# Patient Record
Sex: Male | Born: 1980 | Race: Black or African American | Hispanic: No | Marital: Married | Smoking: Never smoker
Health system: Southern US, Community
[De-identification: ages and names within clinical notes are randomized; demographics above are authoritative.]

## PROBLEM LIST (undated history)

## (undated) DIAGNOSIS — J45909 Unspecified asthma, uncomplicated: Secondary | ICD-10-CM

## (undated) DIAGNOSIS — F32A Depression, unspecified: Secondary | ICD-10-CM

## (undated) DIAGNOSIS — T7840XA Allergy, unspecified, initial encounter: Secondary | ICD-10-CM

## (undated) DIAGNOSIS — F329 Major depressive disorder, single episode, unspecified: Secondary | ICD-10-CM

## (undated) DIAGNOSIS — I10 Essential (primary) hypertension: Secondary | ICD-10-CM

## (undated) HISTORY — PX: OTHER SURGICAL HISTORY: SHX169

## (undated) HISTORY — DX: Depression, unspecified: F32.A

## (undated) HISTORY — DX: Unspecified asthma, uncomplicated: J45.909

## (undated) HISTORY — DX: Allergy, unspecified, initial encounter: T78.40XA

## (undated) HISTORY — DX: Major depressive disorder, single episode, unspecified: F32.9

## (undated) HISTORY — DX: Essential (primary) hypertension: I10

---

## 2015-04-05 ENCOUNTER — Other Ambulatory Visit: Payer: Self-pay | Admitting: Family Medicine

## 2015-04-06 ENCOUNTER — Telehealth: Payer: Self-pay | Admitting: Family Medicine

## 2015-04-06 ENCOUNTER — Other Ambulatory Visit: Payer: Self-pay | Admitting: Family Medicine

## 2015-04-06 DIAGNOSIS — F419 Anxiety disorder, unspecified: Secondary | ICD-10-CM

## 2015-04-06 DIAGNOSIS — J45909 Unspecified asthma, uncomplicated: Secondary | ICD-10-CM | POA: Insufficient documentation

## 2015-04-06 DIAGNOSIS — J4530 Mild persistent asthma, uncomplicated: Secondary | ICD-10-CM

## 2015-04-06 MED ORDER — FLUTICASONE-SALMETEROL 250-50 MCG/DOSE IN AEPB: 1.0000 | INHALATION_SPRAY | Freq: Two times a day (BID) | RESPIRATORY_TRACT | Status: DC

## 2015-04-06 MED ORDER — FLUOXETINE HCL 40 MG PO CAPS: 40.0000 mg | ORAL_CAPSULE | Freq: Every day | ORAL | Status: DC

## 2015-04-06 NOTE — Telephone Encounter (Signed)
Pt called to see if his prescription for prozac had been filled.  His has appt with Dr. Luan Pulling scheduled for July 19th at 10:30.

## 2015-04-06 NOTE — Telephone Encounter (Signed)
I have filled his prozac per his request and given him 1 month of refills. Dr. Luan Pulling may titrate at his next appt.

## 2015-04-17 ENCOUNTER — Other Ambulatory Visit: Payer: Self-pay | Admitting: Family Medicine

## 2015-04-18 ENCOUNTER — Encounter: Payer: Self-pay | Admitting: Family Medicine

## 2015-04-18 ENCOUNTER — Ambulatory Visit (INDEPENDENT_AMBULATORY_CARE_PROVIDER_SITE_OTHER): Payer: BC Managed Care – PPO | Admitting: Family Medicine

## 2015-04-18 VITALS — BP 130/80 | HR 80 | Temp 98.1°F | Resp 16 | Ht 75.0 in | Wt 311.6 lb

## 2015-04-18 DIAGNOSIS — F419 Anxiety disorder, unspecified: Secondary | ICD-10-CM | POA: Diagnosis not present

## 2015-04-18 DIAGNOSIS — R7309 Other abnormal glucose: Secondary | ICD-10-CM

## 2015-04-18 DIAGNOSIS — J4520 Mild intermittent asthma, uncomplicated: Secondary | ICD-10-CM

## 2015-04-18 DIAGNOSIS — R5382 Chronic fatigue, unspecified: Secondary | ICD-10-CM | POA: Diagnosis not present

## 2015-04-18 DIAGNOSIS — R7303 Prediabetes: Secondary | ICD-10-CM

## 2015-04-18 DIAGNOSIS — J4530 Mild persistent asthma, uncomplicated: Secondary | ICD-10-CM

## 2015-04-18 DIAGNOSIS — I10 Essential (primary) hypertension: Secondary | ICD-10-CM | POA: Insufficient documentation

## 2015-04-18 MED ORDER — ALBUTEROL SULFATE HFA 108 (90 BASE) MCG/ACT IN AERS: 2.0000 | INHALATION_SPRAY | Freq: Four times a day (QID) | RESPIRATORY_TRACT | Status: DC | PRN

## 2015-04-18 MED ORDER — FLUOXETINE HCL 40 MG PO CAPS: 40.0000 mg | ORAL_CAPSULE | Freq: Every day | ORAL | Status: DC

## 2015-04-18 MED ORDER — LISINOPRIL 20 MG PO TABS: 20.0000 mg | ORAL_TABLET | Freq: Every day | ORAL | Status: DC

## 2015-04-18 MED ORDER — FLUTICASONE-SALMETEROL 250-50 MCG/DOSE IN AEPB: 1.0000 | INHALATION_SPRAY | Freq: Two times a day (BID) | RESPIRATORY_TRACT | Status: DC

## 2015-04-18 NOTE — Progress Notes (Signed)
Name: Aaron Schwartz   MRN: [id number]    DOB: 19-Jun-1981   Date:04/18/2015       Progress Note  Subjective  Chief Complaint  Chief Complaint  Patient presents with  . Hypertension  . Hyperglycemia  . Anxiety    HPI  Here for f/u of HBP and anxietyh  Has asthma that is well controlled. Has not gotten labs as requested.   Has gained weight.  No c/o except for some decreased energy.  Past Medical History  Diagnosis Date  . Allergy   . Asthma   . Depression   . Hypertension     History reviewed. No pertinent past surgical history.  Family History  Problem Relation Age of Onset  . Hypertension Mother     History   Social History  . Marital Status: Unknown    Spouse Name: N/A  . Number of Children: N/A  . Years of Education: N/A   Occupational History  . Not on file.   Social History Main Topics  . Smoking status: Never Smoker   . Smokeless tobacco: Never Used  . Alcohol Use: No  . Drug Use: No  . Sexual Activity: Not on file   Other Topics Concern  . Not on file   Social History Narrative  . No narrative on file     Current outpatient prescriptions:  .  cetirizine (ZYRTEC) 10 MG tablet, Take 10 mg by mouth daily., Disp: , Rfl:  .  FLUoxetine (PROZAC) 40 MG capsule, Take 1 capsule (40 mg total) by mouth daily., Disp: 30 capsule, Rfl: 0 .  Fluticasone-Salmeterol (ADVAIR DISKUS) 250-50 MCG/DOSE AEPB, Inhale 1 puff into the lungs 2 (two) times daily., Disp: 1 each, Rfl: 0 .  lisinopril (PRINIVIL,ZESTRIL) 20 MG tablet, TAKE 1 TABLET BY MOUTH EVERY DAY, Disp: 90 tablet, Rfl: 0  No Known Allergies   Review of Systems  Constitutional: Positive for malaise/fatigue. Negative for fever, chills and weight loss.  HENT: Negative.   Eyes: Negative.  Negative for blurred vision and double vision.  Respiratory: Negative.  Negative for cough, sputum production, shortness of breath and wheezing.   Cardiovascular: Negative.  Negative for chest pain, palpitations,  orthopnea and leg swelling.  Gastrointestinal: Negative.  Negative for heartburn, nausea, vomiting, abdominal pain, diarrhea and blood in stool.  Genitourinary: Negative.  Negative for dysuria, urgency and frequency.  Musculoskeletal: Negative for myalgias, back pain and joint pain.  Skin: Negative.  Negative for rash.  Neurological: Negative for dizziness, tingling, weakness and headaches.  Psychiatric/Behavioral: Negative for depression. The patient is not nervous/anxious.       Objective  Filed Vitals:   04/18/15 1032  BP: 144/75  Pulse: 80  Temp: 98.1 F (36.7 C)  Resp: 16  Height: 6\' 3"  (1.905 m)  Weight: 311 lb 9.6 oz (141.341 kg)    Physical Exam  Constitutional: He is well-developed, well-nourished, and in no distress. No distress.  HENT:  Head: Normocephalic and atraumatic.  Eyes: Conjunctivae and EOM are normal. Pupils are equal, round, and reactive to light.  Neck: Normal range of motion. Neck supple. No thyromegaly present.  Cardiovascular: Normal rate, regular rhythm, normal heart sounds and intact distal pulses.  Exam reveals no gallop and no friction rub.   No murmur heard. Pulmonary/Chest: Effort normal and breath sounds normal. No respiratory distress. He has no wheezes. He has no rales.  Abdominal: Soft. Bowel sounds are normal. He exhibits no distension and no mass. There is no tenderness.  Musculoskeletal: Normal  range of motion. He exhibits no edema.  Lymphadenopathy:    He has no cervical adenopathy.  Psychiatric: Mood, memory, affect and judgment normal.  Vitals reviewed.         Assessment & Plan  Problem List Items Addressed This Visit      Cardiovascular and Mediastinum   Hypertension - Primary     Respiratory   Asthma    Other Visit Diagnoses    Asthma, mild intermittent, uncomplicated        Acute anxiety           Meds ordered this encounter  Medications  . cetirizine (ZYRTEC) 10 MG tablet    Sig: Take 10 mg by mouth  daily.

## 2015-04-20 LAB — COMPREHENSIVE METABOLIC PANEL
A/G RATIO: 1.6 (ref 1.1–2.5)
ALT: 24 IU/L (ref 0–44)
AST: 20 IU/L (ref 0–40)
Albumin: 5.1 g/dL (ref 3.5–5.5)
Alkaline Phosphatase: 89 IU/L (ref 39–117)
BILIRUBIN TOTAL: 0.5 mg/dL (ref 0.0–1.2)
BUN/Creatinine Ratio: 15 (ref 8–19)
BUN: 19 mg/dL (ref 6–20)
CO2: 22 mmol/L (ref 18–29)
CREATININE: 1.27 mg/dL (ref 0.76–1.27)
Calcium: 10.3 mg/dL — ABNORMAL HIGH (ref 8.7–10.2)
Chloride: 95 mmol/L — ABNORMAL LOW (ref 97–108)
GFR calc non Af Amer: 72 mL/min/{1.73_m2} (ref >59)
GFR, EST AFRICAN AMERICAN: 83 mL/min/{1.73_m2} (ref >59)
GLUCOSE: 105 mg/dL — AB (ref 65–99)
Globulin, Total: 3.2 g/dL (ref 1.5–4.5)
Potassium: 5 mmol/L (ref 3.5–5.2)
Sodium: 136 mmol/L (ref 134–144)
Total Protein: 8.3 g/dL (ref 6.0–8.5)

## 2015-04-20 LAB — LIPID PANEL
CHOL/HDL RATIO: 5.3 ratio — AB (ref 0.0–5.0)
Cholesterol, Total: 263 mg/dL — ABNORMAL HIGH (ref 100–199)
HDL: 50 mg/dL (ref >39)
LDL Calculated: 186 mg/dL — ABNORMAL HIGH (ref 0–99)
TRIGLYCERIDES: 133 mg/dL (ref 0–149)
VLDL Cholesterol Cal: 27 mg/dL (ref 5–40)

## 2015-04-20 LAB — CBC WITH DIFFERENTIAL/PLATELET
BASOS ABS: 0 10*3/uL (ref 0.0–0.2)
BASOS: 0 %
EOS (ABSOLUTE): 0.1 10*3/uL (ref 0.0–0.4)
Eos: 2 %
HEMATOCRIT: 44.4 % (ref 37.5–51.0)
Hemoglobin: 14.9 g/dL (ref 12.6–17.7)
IMMATURE GRANS (ABS): 0 10*3/uL (ref 0.0–0.1)
Immature Granulocytes: 0 %
LYMPHS: 44 %
Lymphocytes Absolute: 1.8 10*3/uL (ref 0.7–3.1)
MCH: 28.9 pg (ref 26.6–33.0)
MCHC: 33.6 g/dL (ref 31.5–35.7)
MCV: 86 fL (ref 79–97)
Monocytes Absolute: 0.4 10*3/uL (ref 0.1–0.9)
Monocytes: 9 %
NEUTROS PCT: 45 %
Neutrophils Absolute: 1.9 10*3/uL (ref 1.4–7.0)
PLATELETS: 383 10*3/uL — AB (ref 150–379)
RBC: 5.15 x10E6/uL (ref 4.14–5.80)
RDW: 14.7 % (ref 12.3–15.4)
WBC: 4.2 10*3/uL (ref 3.4–10.8)

## 2015-04-20 LAB — HEMOGLOBIN A1C
Est. average glucose Bld gHb Est-mCnc: 146 mg/dL
Hgb A1c MFr Bld: 6.7 % — ABNORMAL HIGH (ref 4.8–5.6)

## 2015-04-20 LAB — TSH: TSH: 0.845 u[IU]/mL (ref 0.450–4.500)

## 2015-04-21 ENCOUNTER — Telehealth: Payer: Self-pay | Admitting: Family Medicine

## 2015-04-21 NOTE — Telephone Encounter (Signed)
Pt called for lab results.  Please call [phone]

## 2015-04-21 NOTE — Telephone Encounter (Signed)
Pt.notified

## 2015-05-01 ENCOUNTER — Other Ambulatory Visit: Payer: Self-pay | Admitting: Family Medicine

## 2015-05-05 ENCOUNTER — Other Ambulatory Visit: Payer: Self-pay | Admitting: Family Medicine

## 2015-07-12 NOTE — Telephone Encounter (Signed)
Error

## 2015-07-14 ENCOUNTER — Other Ambulatory Visit: Payer: Self-pay | Admitting: Family Medicine

## 2015-08-23 ENCOUNTER — Ambulatory Visit (INDEPENDENT_AMBULATORY_CARE_PROVIDER_SITE_OTHER): Payer: BC Managed Care – PPO | Admitting: Family Medicine

## 2015-08-23 ENCOUNTER — Encounter: Payer: Self-pay | Admitting: Family Medicine

## 2015-08-23 VITALS — BP 135/80 | HR 69 | Temp 98.0°F | Resp 16 | Ht 75.0 in | Wt 311.0 lb

## 2015-08-23 DIAGNOSIS — L309 Dermatitis, unspecified: Secondary | ICD-10-CM | POA: Insufficient documentation

## 2015-08-23 DIAGNOSIS — I10 Essential (primary) hypertension: Secondary | ICD-10-CM

## 2015-08-23 DIAGNOSIS — J4530 Mild persistent asthma, uncomplicated: Secondary | ICD-10-CM

## 2015-08-23 DIAGNOSIS — F419 Anxiety disorder, unspecified: Secondary | ICD-10-CM

## 2015-08-23 DIAGNOSIS — J302 Other seasonal allergic rhinitis: Secondary | ICD-10-CM

## 2015-08-23 MED ORDER — FLUOCINONIDE 0.05 % EX CREA: 1.0000 "application " | TOPICAL_CREAM | Freq: Two times a day (BID) | CUTANEOUS | Status: DC

## 2015-08-23 MED ORDER — FLUTICASONE-SALMETEROL 250-50 MCG/DOSE IN AEPB: 1.0000 | INHALATION_SPRAY | Freq: Two times a day (BID) | RESPIRATORY_TRACT | Status: DC

## 2015-08-23 MED ORDER — FLUOXETINE HCL 40 MG PO CAPS: 40.0000 mg | ORAL_CAPSULE | Freq: Every day | ORAL | Status: DC

## 2015-08-23 MED ORDER — LISINOPRIL 20 MG PO TABS: 20.0000 mg | ORAL_TABLET | Freq: Every day | ORAL | Status: DC

## 2015-08-23 MED ORDER — TRIAMCINOLONE ACETONIDE 0.5 % EX OINT: 1.0000 "application " | TOPICAL_OINTMENT | Freq: Two times a day (BID) | CUTANEOUS | Status: DC

## 2015-08-23 MED ORDER — ALBUTEROL SULFATE HFA 108 (90 BASE) MCG/ACT IN AERS: 2.0000 | INHALATION_SPRAY | Freq: Four times a day (QID) | RESPIRATORY_TRACT | Status: DC | PRN

## 2015-08-23 MED ORDER — FLUTICASONE PROPIONATE 50 MCG/ACT NA SUSP: 2.0000 | Freq: Every day | NASAL | Status: DC

## 2015-08-23 NOTE — Progress Notes (Signed)
Name: Aaron Schwartz   MRN: [id number]    DOB: Dec 18, 1980   Date:08/23/2015       Progress Note  Subjective  Chief Complaint  Chief Complaint  Patient presents with  . Hypertension    HPI  Here for f/u of HBP.  Having some asthma flairs with cold weather.  Some nasal conmgestion.   C/o some eczema on face and head and behind ears.  No problem-specific assessment & plan notes found for this encounter.   Past Medical History  Diagnosis Date  . Allergy   . Asthma   . Depression   . Hypertension     Past Surgical History  Procedure Laterality Date  . None      Family History  Problem Relation Age of Onset  . Hypertension Mother     Social History   Social History  . Marital Status: Unknown    Spouse Name: N/A  . Number of Children: N/A  . Years of Education: N/A   Occupational History  . Not on file.   Social History Main Topics  . Smoking status: Never Smoker   . Smokeless tobacco: Never Used  . Alcohol Use: No  . Drug Use: No  . Sexual Activity: Not on file   Other Topics Concern  . Not on file   Social History Narrative     Current outpatient prescriptions:  .  albuterol (PROVENTIL HFA;VENTOLIN HFA) 108 (90 BASE) MCG/ACT inhaler, Inhale 2 puffs into the lungs every 6 (six) hours as needed for wheezing or shortness of breath., Disp: 1 Inhaler, Rfl: 12 .  cetirizine (ZYRTEC) 10 MG tablet, TAKE 1 TABLET BY MOUTH EVERY DAY, Disp: 90 tablet, Rfl: 1 .  FLUoxetine (PROZAC) 40 MG capsule, Take 1 capsule (40 mg total) by mouth daily., Disp: 90 capsule, Rfl: 3 .  Fluticasone-Salmeterol (ADVAIR) 250-50 MCG/DOSE AEPB, Inhale 1 puff into the lungs 2 (two) times daily., Disp: 1 each, Rfl: 2 .  lisinopril (PRINIVIL,ZESTRIL) 20 MG tablet, Take 1 tablet (20 mg total) by mouth daily., Disp: 90 tablet, Rfl: 3 .  fluocinonide cream (LIDEX) AB-123456789 %, Apply 1 application topically 2 (two) times daily., Disp: 30 g, Rfl: 6 .  fluticasone (FLONASE) 50 MCG/ACT nasal spray, Place  2 sprays into both nostrils daily., Disp: 16 g, Rfl: 3 .  triamcinolone ointment (KENALOG) 0.5 %, Apply 1 application topically 2 (two) times daily., Disp: 30 g, Rfl: 6  No Known Allergies   Review of Systems  Constitutional: Negative for fever, chills, weight loss and malaise/fatigue.  Respiratory: Positive for shortness of breath and wheezing. Negative for cough and sputum production.   Cardiovascular: Negative for chest pain, palpitations and leg swelling.  Gastrointestinal: Negative for heartburn, abdominal pain and blood in stool.  Genitourinary: Negative for dysuria, urgency and frequency.  Musculoskeletal: Negative for myalgias and joint pain.  Skin: Positive for rash (eczema).  Neurological: Negative for dizziness, tremors and weakness.      Objective  Filed Vitals:   08/23/15 1049 08/23/15 1115  BP: 147/82 135/80  Pulse: 69   Temp: 98 F (36.7 C)   TempSrc: Oral   Resp: 16   Height: 6\' 3"  (1.905 m)   Weight: 311 lb (141.069 kg)     Physical Exam  Constitutional: He is well-developed, well-nourished, and in no distress. No distress.  HENT:  Head: Normocephalic and atraumatic.  Eyes: Conjunctivae and EOM are normal. Pupils are equal, round, and reactive to light. No scleral icterus.  Neck: Normal range  of motion. Neck supple. Carotid bruit is not present. No thyromegaly present.  Cardiovascular: Normal rate, regular rhythm, normal heart sounds and intact distal pulses.  Exam reveals no gallop and no friction rub.   No murmur heard. Pulmonary/Chest: Effort normal and breath sounds normal. No respiratory distress. He has no wheezes. He has no rales.  Abdominal: Soft. Bowel sounds are normal. He exhibits no distension and no abdominal bruit. There is no tenderness. There is no rebound.  Musculoskeletal: Normal range of motion. He exhibits no edema.  Lymphadenopathy:    He has no cervical adenopathy.  Skin:  Eczema behind ears, on scalp. On cheeks and beneath eyes.   Vitals reviewed.      No results found for this or any previous visit (from the past 2160 hour(s)).   Assessment & Plan  Problem List Items Addressed This Visit      Cardiovascular and Mediastinum   Hypertension   Relevant Medications   lisinopril (PRINIVIL,ZESTRIL) 20 MG tablet     Respiratory   Asthma - Primary   Relevant Medications   albuterol (PROVENTIL HFA;VENTOLIN HFA) 108 (90 BASE) MCG/ACT inhaler   Fluticasone-Salmeterol (ADVAIR) 250-50 MCG/DOSE AEPB     Musculoskeletal and Integument   Eczema   Relevant Medications   fluocinonide cream (LIDEX) 0.05 %   triamcinolone ointment (KENALOG) 0.5 %     Other   Anxiety   Relevant Medications   FLUoxetine (PROZAC) 40 MG capsule    Other Visit Diagnoses    Acute anxiety        Relevant Medications    FLUoxetine (PROZAC) 40 MG capsule    Seasonal allergies        Relevant Medications    fluticasone (FLONASE) 50 MCG/ACT nasal spray       Meds ordered this encounter  Medications  . albuterol (PROVENTIL HFA;VENTOLIN HFA) 108 (90 BASE) MCG/ACT inhaler    Sig: Inhale 2 puffs into the lungs every 6 (six) hours as needed for wheezing or shortness of breath.    Dispense:  1 Inhaler    Refill:  12  . fluticasone (FLONASE) 50 MCG/ACT nasal spray    Sig: Place 2 sprays into both nostrils daily.    Dispense:  16 g    Refill:  3  . FLUoxetine (PROZAC) 40 MG capsule    Sig: Take 1 capsule (40 mg total) by mouth daily.    Dispense:  90 capsule    Refill:  3  . lisinopril (PRINIVIL,ZESTRIL) 20 MG tablet    Sig: Take 1 tablet (20 mg total) by mouth daily.    Dispense:  90 tablet    Refill:  3  . Fluticasone-Salmeterol (ADVAIR) 250-50 MCG/DOSE AEPB    Sig: Inhale 1 puff into the lungs 2 (two) times daily.    Dispense:  1 each    Refill:  2  . fluocinonide cream (LIDEX) 0.05 %    Sig: Apply 1 application topically 2 (two) times daily.    Dispense:  30 g    Refill:  6  . triamcinolone ointment (KENALOG) 0.5 %     Sig: Apply 1 application topically 2 (two) times daily.    Dispense:  30 g    Refill:  6   . 1. Asthma, mild persistent, uncomplicated  - albuterol (PROVENTIL HFA;VENTOLIN HFA) 108 (90 BASE) MCG/ACT inhaler; Inhale 2 puffs into the lungs every 6 (six) hours as needed for wheezing or shortness of breath.  Dispense: 1 Inhaler; Refill: 12 - Fluticasone-Salmeterol (  ADVAIR) 250-50 MCG/DOSE AEPB; Inhale 1 puff into the lungs 2 (two) times daily.  Dispense: 1 each; Refill: 2  2. Essential hypertension  - lisinopril (PRINIVIL,ZESTRIL) 20 MG tablet; Take 1 tablet (20 mg total) by mouth daily.  Dispense: 90 tablet; Refill: 3  3. Anxiety   4. Eczema  - fluocinonide cream (LIDEX) 0.05 %; Apply 1 application topically 2 (two) times daily.  Dispense: 30 g; Refill: 6 - triamcinolone ointment (KENALOG) 0.5 %; Apply 1 application topically 2 (two) times daily.  Dispense: 30 g; Refill: 6  5. Acute anxiety  - FLUoxetine (PROZAC) 40 MG capsule; Take 1 capsule (40 mg total) by mouth daily.  Dispense: 90 capsule; Refill: 3  6. Seasonal allergies  - fluticasone (FLONASE) 50 MCG/ACT nasal spray; Place 2 sprays into both nostrils daily.  Dispense: 16 g; Refill: 3

## 2015-08-24 ENCOUNTER — Ambulatory Visit: Payer: BC Managed Care – PPO | Admitting: Family Medicine

## 2015-10-06 ENCOUNTER — Other Ambulatory Visit: Payer: Self-pay | Admitting: Family Medicine

## 2015-10-06 DIAGNOSIS — J4530 Mild persistent asthma, uncomplicated: Secondary | ICD-10-CM

## 2015-10-06 MED ORDER — FLUTICASONE-SALMETEROL 250-50 MCG/DOSE IN AEPB: 1.0000 | INHALATION_SPRAY | Freq: Two times a day (BID) | RESPIRATORY_TRACT | Status: DC

## 2015-12-01 ENCOUNTER — Other Ambulatory Visit: Payer: Self-pay | Admitting: *Deleted

## 2015-12-01 DIAGNOSIS — J302 Other seasonal allergic rhinitis: Secondary | ICD-10-CM

## 2015-12-01 MED ORDER — FLUTICASONE PROPIONATE 50 MCG/ACT NA SUSP: 2.0000 | Freq: Every day | NASAL | Status: DC

## 2016-02-10 ENCOUNTER — Other Ambulatory Visit: Payer: Self-pay | Admitting: Family Medicine

## 2016-03-14 ENCOUNTER — Ambulatory Visit: Payer: BC Managed Care – PPO | Admitting: Family Medicine

## 2016-03-25 ENCOUNTER — Ambulatory Visit (INDEPENDENT_AMBULATORY_CARE_PROVIDER_SITE_OTHER): Payer: BC Managed Care – PPO | Admitting: Family Medicine

## 2016-03-25 ENCOUNTER — Ambulatory Visit: Payer: BC Managed Care – PPO | Admitting: Family Medicine

## 2016-03-25 ENCOUNTER — Encounter: Payer: Self-pay | Admitting: Family Medicine

## 2016-03-25 VITALS — BP 135/75 | HR 73 | Temp 98.7°F | Resp 16 | Ht 75.0 in | Wt 314.0 lb

## 2016-03-25 DIAGNOSIS — F419 Anxiety disorder, unspecified: Secondary | ICD-10-CM

## 2016-03-25 DIAGNOSIS — S93401A Sprain of unspecified ligament of right ankle, initial encounter: Secondary | ICD-10-CM | POA: Diagnosis not present

## 2016-03-25 DIAGNOSIS — I10 Essential (primary) hypertension: Secondary | ICD-10-CM

## 2016-03-25 MED ORDER — LISINOPRIL 20 MG PO TABS: 20.0000 mg | ORAL_TABLET | Freq: Every day | ORAL | Status: DC

## 2016-03-25 MED ORDER — FLUOXETINE HCL 40 MG PO CAPS: 40.0000 mg | ORAL_CAPSULE | Freq: Every day | ORAL | Status: DC

## 2016-03-25 NOTE — Progress Notes (Signed)
Name: Aaron Schwartz   MRN: [id number]    DOB: 07-20-1981   Date:03/25/2016       Progress Note  Subjective  Chief Complaint  Chief Complaint  Patient presents with  . Hypertension  . Anxiety    HPI Here for f/u of HBP and anxiety.  Taking his BP med and his Prozac.  Doing well.  He turned his ankle 3 days ago.  Cont. to be swollen and painful to walk (mod).  No problem-specific assessment & plan notes found for this encounter.   Past Medical History  Diagnosis Date  . Allergy   . Asthma   . Depression   . Hypertension     Past Surgical History  Procedure Laterality Date  . None      Family History  Problem Relation Age of Onset  . Hypertension Mother     Social History   Social History  . Marital Status: Unknown    Spouse Name: N/A  . Number of Children: N/A  . Years of Education: N/A   Occupational History  . Not on file.   Social History Main Topics  . Smoking status: Never Smoker   . Smokeless tobacco: Never Used  . Alcohol Use: No  . Drug Use: No  . Sexual Activity: Not on file   Other Topics Concern  . Not on file   Social History Narrative     Current outpatient prescriptions:  .  albuterol (PROVENTIL HFA;VENTOLIN HFA) 108 (90 BASE) MCG/ACT inhaler, Inhale 2 puffs into the lungs every 6 (six) hours as needed for wheezing or shortness of breath., Disp: 1 Inhaler, Rfl: 12 .  cetirizine (ZYRTEC) 10 MG tablet, TAKE 1 TABLET BY MOUTH EVERY DAY, Disp: 90 tablet, Rfl: 1 .  fluocinonide cream (LIDEX) AB-123456789 %, Apply 1 application topically 2 (two) times daily., Disp: 30 g, Rfl: 6 .  FLUoxetine (PROZAC) 40 MG capsule, Take 1 capsule (40 mg total) by mouth daily., Disp: 90 capsule, Rfl: 3 .  fluticasone (FLONASE) 50 MCG/ACT nasal spray, Place 2 sprays into both nostrils daily., Disp: 48 g, Rfl: 2 .  Fluticasone-Salmeterol (ADVAIR) 250-50 MCG/DOSE AEPB, Inhale 1 puff into the lungs 2 (two) times daily., Disp: 180 each, Rfl: 1 .  lisinopril  (PRINIVIL,ZESTRIL) 20 MG tablet, Take 1 tablet (20 mg total) by mouth daily., Disp: 90 tablet, Rfl: 3 .  triamcinolone ointment (KENALOG) 0.5 %, Apply 1 application topically 2 (two) times daily., Disp: 30 g, Rfl: 6  Not on File   Review of Systems  Constitutional: Negative for fever, chills, weight loss and malaise/fatigue.  HENT: Negative for hearing loss.   Eyes: Negative for blurred vision and double vision.  Respiratory: Negative for cough, shortness of breath and wheezing.   Cardiovascular: Negative for chest pain, palpitations and leg swelling.  Gastrointestinal: Negative for heartburn, abdominal pain and blood in stool.  Genitourinary: Negative for dysuria, urgency and frequency.  Musculoskeletal: Positive for joint pain (R ankle).  Skin: Negative for rash.  Neurological: Negative for dizziness, tremors, weakness and headaches.      Objective  Filed Vitals:   03/25/16 1002 03/25/16 1019  BP: 152/88 135/75  Pulse: 73   Temp: 98.7 F (37.1 C)   TempSrc: Oral   Resp: 16   Height: 6\' 3"  (1.905 m)   Weight: 314 lb (142.429 kg)     Physical Exam  Constitutional: He is oriented to person, place, and time and well-developed, well-nourished, and in no distress. No distress.  HENT:  Head: Normocephalic and atraumatic.  Eyes: Conjunctivae and EOM are normal. Pupils are equal, round, and reactive to light. No scleral icterus.  Neck: Normal range of motion. Neck supple. Carotid bruit is not present. No thyromegaly present.  Cardiovascular: Normal rate, regular rhythm and normal heart sounds.  Exam reveals no gallop and no friction rub.   No murmur heard. Pulmonary/Chest: He is in respiratory distress. He has no wheezes. He has no rales.  Abdominal: Soft. Bowel sounds are normal. He exhibits no distension and no mass. There is no tenderness.  Musculoskeletal: He exhibits no edema.  Tender R lateral foot/ankle in soft tissue areas.  No bony abnormalities.    Lymphadenopathy:     He has no cervical adenopathy.  Neurological: He is alert and oriented to person, place, and time.  Vitals reviewed.      No results found for this or any previous visit (from the past 2160 hour(s)).   Assessment & Plan  Problem List Items Addressed This Visit      Cardiovascular and Mediastinum   Hypertension - Primary     Other   Anxiety    Other Visit Diagnoses    Ankle sprain, right, initial encounter           No orders of the defined types were placed in this encounter.   1. Essential hypertension  - lisinopril (PRINIVIL,ZESTRIL) 20 MG tablet; Take 1 tablet (20 mg total) by mouth daily.  Dispense: 90 tablet; Refill: 3 RTC- 4 months  2. Anxiety  - FLUoxetine (PROZAC) 40 MG capsule; Take 1 capsule (40 mg total) by mouth daily.  Dispense: 90 capsule; Refill: 3  3. Ankle sprain, right, initial encounter Ice, ACE wrap, elevation, rest.  4. Acute anxiety

## 2016-05-14 ENCOUNTER — Other Ambulatory Visit: Payer: Self-pay | Admitting: Family Medicine

## 2016-05-14 DIAGNOSIS — F419 Anxiety disorder, unspecified: Secondary | ICD-10-CM

## 2016-06-07 ENCOUNTER — Other Ambulatory Visit: Payer: Self-pay | Admitting: Family Medicine

## 2016-06-07 DIAGNOSIS — L309 Dermatitis, unspecified: Secondary | ICD-10-CM

## 2016-08-04 ENCOUNTER — Other Ambulatory Visit: Payer: Self-pay | Admitting: Family Medicine

## 2016-08-20 ENCOUNTER — Encounter: Payer: Self-pay | Admitting: Family Medicine

## 2016-08-20 ENCOUNTER — Ambulatory Visit (INDEPENDENT_AMBULATORY_CARE_PROVIDER_SITE_OTHER): Payer: BC Managed Care – PPO | Admitting: Family Medicine

## 2016-08-20 VITALS — BP 142/80 | HR 80 | Temp 98.5°F | Resp 16 | Ht 75.0 in | Wt 310.0 lb

## 2016-08-20 DIAGNOSIS — J4520 Mild intermittent asthma, uncomplicated: Secondary | ICD-10-CM | POA: Diagnosis not present

## 2016-08-20 DIAGNOSIS — F419 Anxiety disorder, unspecified: Secondary | ICD-10-CM

## 2016-08-20 DIAGNOSIS — J209 Acute bronchitis, unspecified: Secondary | ICD-10-CM

## 2016-08-20 DIAGNOSIS — J0141 Acute recurrent pansinusitis: Secondary | ICD-10-CM

## 2016-08-20 DIAGNOSIS — L308 Other specified dermatitis: Secondary | ICD-10-CM

## 2016-08-20 DIAGNOSIS — I10 Essential (primary) hypertension: Secondary | ICD-10-CM

## 2016-08-20 MED ORDER — FLUOCINOLONE ACETONIDE SCALP 0.01 % EX OIL: 1.0000 "application " | TOPICAL_OIL | Freq: Two times a day (BID) | CUTANEOUS | 6 refills | Status: DC

## 2016-08-20 MED ORDER — LEVOFLOXACIN 500 MG PO TABS: 500.0000 mg | ORAL_TABLET | Freq: Every day | ORAL | 0 refills | Status: AC

## 2016-08-20 NOTE — Progress Notes (Signed)
Name: Aaron Schwartz   MRN: [id number]    DOB: 12/18/1980   Date:08/20/2016       Progress Note  Subjective  Chief Complaint  Chief Complaint  Patient presents with  . Follow-up    Bp and anxiety   . Nasal Congestion    dry cough, flare of asthma, and yellow mucus onset for 3 weeks.    HPI Here for f/u of HBP and anxiety.  Taking Lisinopril and Prozac.  Has had some congestion that required inhalers.  He has a cold with cough.  Cough in chest.  Has had bloody mucus from nose.  Some light brown chest sputum occ.  No fever or chills. No problem-specific Assessment & Plan notes found for this encounter.   Past Medical History:  Diagnosis Date  . Allergy   . Asthma   . Depression   . Hypertension     Past Surgical History:  Procedure Laterality Date  . none      Family History  Problem Relation Age of Onset  . Hypertension Mother     Social History   Social History  . Marital status: Unknown    Spouse name: N/A  . Number of children: N/A  . Years of education: N/A   Occupational History  . Not on file.   Social History Main Topics  . Smoking status: Never Smoker  . Smokeless tobacco: Never Used  . Alcohol use Yes     Comment: occasionally   . Drug use: No  . Sexual activity: Not on file   Other Topics Concern  . Not on file   Social History Narrative  . No narrative on file     Current Outpatient Prescriptions:  .  albuterol (PROVENTIL HFA;VENTOLIN HFA) 108 (90 BASE) MCG/ACT inhaler, Inhale 2 puffs into the lungs every 6 (six) hours as needed for wheezing or shortness of breath., Disp: 1 Inhaler, Rfl: 12 .  cetirizine (ZYRTEC) 10 MG tablet, TAKE 1 TABLET BY MOUTH EVERY DAY, Disp: 90 tablet, Rfl: 3 .  fluocinonide cream (LIDEX) AB-123456789 %, Apply 1 application topically 2 (two) times daily., Disp: 30 g, Rfl: 6 .  FLUoxetine (PROZAC) 40 MG capsule, TAKE 1 CAPSULE (40 MG TOTAL) BY MOUTH DAILY., Disp: 90 capsule, Rfl: 3 .  fluticasone (FLONASE) 50 MCG/ACT nasal  spray, Place 2 sprays into both nostrils daily., Disp: 48 g, Rfl: 2 .  Fluticasone-Salmeterol (ADVAIR) 250-50 MCG/DOSE AEPB, Inhale 1 puff into the lungs 2 (two) times daily., Disp: 180 each, Rfl: 1 .  lisinopril (PRINIVIL,ZESTRIL) 20 MG tablet, Take 1 tablet (20 mg total) by mouth daily., Disp: 90 tablet, Rfl: 3 .  triamcinolone ointment (KENALOG) 0.5 %, APPLY 1 APPLICATION TOPICALLY 2 (TWO) TIMES DAILY., Disp: 30 g, Rfl: 2 .  Fluocinolone Acetonide Scalp 0.01 % OIL, Apply 1 application topically 2 (two) times daily., Disp: 1 Bottle, Rfl: 6 .  levofloxacin (LEVAQUIN) 500 MG tablet, Take 1 tablet (500 mg total) by mouth daily., Disp: 10 tablet, Rfl: 0  Not on File   Review of Systems  Constitutional: Positive for malaise/fatigue. Negative for chills, fever and weight loss.  HENT: Positive for congestion. Negative for hearing loss and tinnitus.   Eyes: Negative for blurred vision and double vision.  Respiratory: Positive for cough and sputum production. Negative for shortness of breath and wheezing.   Cardiovascular: Negative for chest pain, palpitations and leg swelling.  Gastrointestinal: Negative for abdominal pain, blood in stool and heartburn.  Genitourinary: Negative for dysuria, frequency  and urgency.  Musculoskeletal: Negative for joint pain and neck pain.  Skin: Negative for rash.  Neurological: Negative for dizziness, tingling, tremors, weakness and headaches.      Objective  Vitals:   08/20/16 1551 08/20/16 1554  BP: (!) 165/82 (!) 142/80  Pulse: 81 80  Resp: 16   Temp: 98.5 F (36.9 C)   TempSrc: Oral   Weight: (!) 140.6 kg (310 lb)   Height: 6\' 3"  (1.905 m)     Physical Exam  Constitutional: He is oriented to person, place, and time and well-developed, well-nourished, and in no distress. No distress.  HENT:  Head: Normocephalic and atraumatic.  Eyes: Conjunctivae and EOM are normal. Pupils are equal, round, and reactive to light. No scleral icterus.  Neck:  Normal range of motion. Neck supple. No thyromegaly present.  Cardiovascular: Normal rate, regular rhythm and normal heart sounds.  Exam reveals no gallop and no friction rub.   No murmur heard. Pulmonary/Chest: Effort normal and breath sounds normal. No respiratory distress. He has no wheezes. He has no rales.  Abdominal: Soft. Bowel sounds are normal. He exhibits no distension and no mass. There is no tenderness.  Musculoskeletal: He exhibits no edema.  Lymphadenopathy:    He has no cervical adenopathy.  Neurological: He is alert and oriented to person, place, and time.  Skin:  Eczema of scalp, behind ears, chest.  Psychiatric:  Affect is good oday  Vitals reviewed.      No results found for this or any previous visit (from the past 2160 hour(s)).   Assessment & Plan  Problem List Items Addressed This Visit      Cardiovascular and Mediastinum   Hypertension - Primary     Respiratory   Asthma     Musculoskeletal and Integument   Eczema   Relevant Medications   Fluocinolone Acetonide Scalp 0.01 % OIL     Other   Anxiety    Other Visit Diagnoses    Acute bronchitis, unspecified organism       Relevant Medications   levofloxacin (LEVAQUIN) 500 MG tablet   Acute recurrent pansinusitis       Relevant Medications   levofloxacin (LEVAQUIN) 500 MG tablet      Meds ordered this encounter  Medications  . levofloxacin (LEVAQUIN) 500 MG tablet    Sig: Take 1 tablet (500 mg total) by mouth daily.    Dispense:  10 tablet    Refill:  0  . Fluocinolone Acetonide Scalp 0.01 % OIL    Sig: Apply 1 application topically 2 (two) times daily.    Dispense:  1 Bottle    Refill:  6  1. Essential hypertension Cont Lisinopril  2. Mild intermittent asthma without complication Cont inhalers as needed  3. Other eczema  - Fluocinolone Acetonide Scalp 0.01 % OIL; Apply 1 application topically 2 (two) times daily.  Dispense: 1 Bottle; Refill: 6  4. Anxiety Cont Prozac  5.  Acute bronchitis, unspecified organism  - levofloxacin (LEVAQUIN) 500 MG tablet; Take 1 tablet (500 mg total) by mouth daily.  Dispense: 10 tablet; Refill: 0  6. Acute recurrent pansinusitis  - levofloxacin (LEVAQUIN) 500 MG tablet; Take 1 tablet (500 mg total) by mouth daily.  Dispense: 10 tablet; Refill: 0

## 2016-09-27 ENCOUNTER — Ambulatory Visit (INDEPENDENT_AMBULATORY_CARE_PROVIDER_SITE_OTHER): Payer: BC Managed Care – PPO | Admitting: Family Medicine

## 2016-09-27 ENCOUNTER — Encounter: Payer: Self-pay | Admitting: Family Medicine

## 2016-09-27 VITALS — BP 137/78 | HR 80 | Temp 98.8°F | Resp 16 | Ht 75.0 in | Wt 319.6 lb

## 2016-09-27 DIAGNOSIS — E559 Vitamin D deficiency, unspecified: Secondary | ICD-10-CM

## 2016-09-27 DIAGNOSIS — R7309 Other abnormal glucose: Secondary | ICD-10-CM | POA: Diagnosis not present

## 2016-09-27 DIAGNOSIS — J3489 Other specified disorders of nose and nasal sinuses: Secondary | ICD-10-CM | POA: Diagnosis not present

## 2016-09-27 DIAGNOSIS — H1031 Unspecified acute conjunctivitis, right eye: Secondary | ICD-10-CM | POA: Diagnosis not present

## 2016-09-27 DIAGNOSIS — I10 Essential (primary) hypertension: Secondary | ICD-10-CM | POA: Diagnosis not present

## 2016-09-27 DIAGNOSIS — E119 Type 2 diabetes mellitus without complications: Secondary | ICD-10-CM

## 2016-09-27 DIAGNOSIS — E782 Mixed hyperlipidemia: Secondary | ICD-10-CM

## 2016-09-27 DIAGNOSIS — R7989 Other specified abnormal findings of blood chemistry: Secondary | ICD-10-CM | POA: Diagnosis not present

## 2016-09-27 DIAGNOSIS — D473 Essential (hemorrhagic) thrombocythemia: Secondary | ICD-10-CM | POA: Diagnosis not present

## 2016-09-27 DIAGNOSIS — J3089 Other allergic rhinitis: Secondary | ICD-10-CM | POA: Insufficient documentation

## 2016-09-27 DIAGNOSIS — E1169 Type 2 diabetes mellitus with other specified complication: Secondary | ICD-10-CM | POA: Insufficient documentation

## 2016-09-27 DIAGNOSIS — E785 Hyperlipidemia, unspecified: Secondary | ICD-10-CM | POA: Insufficient documentation

## 2016-09-27 DIAGNOSIS — D75839 Thrombocytosis, unspecified: Secondary | ICD-10-CM

## 2016-09-27 MED ORDER — OLOPATADINE HCL 0.1 % OP SOLN: 1.0000 [drp] | Freq: Every day | OPHTHALMIC | 2 refills | Status: DC | PRN

## 2016-09-27 MED ORDER — OFLOXACIN 0.3 % OP SOLN: 2.0000 [drp] | Freq: Four times a day (QID) | OPHTHALMIC | 0 refills | Status: DC

## 2016-09-27 NOTE — Progress Notes (Signed)
Subjective:    Patient ID: Aaron Schwartz, male    DOB: 06/15/1981, 35 y.o.   MRN: [id number]  Aaron Schwartz is a 35 y.o. male presenting on 09/27/2016 for Conjunctivitis (onset 6 days crusty eyes when he wakes up runny discharge Right side)   HPI  Right Eye Conjunctivitis: - Reports R eye started with redness irritation thicker drainage and discharge over past 6 days, occasional intermittent improvement but then worsening again and persistent symptoms  - Had URI symptoms about 1 month ago now mostly resolved still slightly lingering - Has significant allergies takes Cetirizine daily, Flonase regularly. Occasionally uses OTC anti-histamine eye drops, feels different than allergies - Admits sick contact previously 1-2 months ago with daughter 30 months old had conjunctivitis with daycare exposure - Denies fevers/chills, nasal congestion, cough, eye injury, eye pain, loss of vision, eyelid swelling or facial rash  Right Nasal Mass: - Reports he has never had a history of nasal mass or polyp. Has chronic allergic rhinitis, uses flonase and cetirizine regularly, has some nasal congestion but this is usually intermittent, has not had problem with unable to breath out of right nostril. Has not seen ENT for this before. - Denies significant bleeding from nose or epistaxis, nose pain or sinus pain  Chronic HTN / History of Abnormal Glucose / Hyperlipidemia: - Last labs checked 03/2015, patient with reported HTN, controlled on meds with Lisinopril daily. Has had elevated abnormal cholesterol, not taking statin or cholesterol med. Prior abnormal glucose by chart review A1c 6.7 in past 2016, has not been dx with Pre-Dm or DM without significant DM family history  Family History  Problem Relation Age of Onset  . Hypertension Mother      Social History  Substance Use Topics  . Smoking status: Never Smoker  . Smokeless tobacco: Never Used  . Alcohol use Yes     Comment: occasionally      Review of Systems Per HPI unless specifically indicated above     Objective:    BP 137/78   Pulse 80   Temp 98.8 F (37.1 C) (Oral)   Resp 16   Ht 6\' 3"  (1.905 m)   Wt (!) 319 lb 9.6 oz (145 kg)   BMI 39.95 kg/m   Wt Readings from Last 3 Encounters:  09/27/16 (!) 319 lb 9.6 oz (145 kg)  08/20/16 (!) 310 lb (140.6 kg)  03/25/16 (!) 314 lb (142.4 kg)    Physical Exam  Constitutional: He appears well-developed and well-nourished. No distress.  Well-appearing, comfortable, cooperative, athletic build  HENT:  Head: Normocephalic and atraumatic.  Frontal / maxillary sinuses non-tender. Oropharynx clear without erythema, exudates, edema or asymmetry.  Nares patent without purulence or edema. Right nasal cavity deeper in viewable with otoscope with Right lateral wall moderately sized nasal mass with dried blood and crusty abnormal mucosal tissue appearance, some congestion, not obstructing, see picture below  Eyes: EOM are normal. Pupils are equal, round, and reactive to light. Right eye exhibits discharge (watery discharge). Left eye exhibits no discharge.  Right eye Mild generalized scleral / conjunctival injection without focal point. Residual crusted drainage medial > lateral. No eyelid erythema or edema.  Neck: Normal range of motion. Neck supple.  Cardiovascular: Normal rate and intact distal pulses.   Pulmonary/Chest: Effort normal.  Lymphadenopathy:    He has no cervical adenopathy.  Neurological: He is alert.  Skin: Skin is warm and dry. He is not diaphoretic.  Nursing note and vitals reviewed.  Right nare, zoomed in picture through using otoscope      Results for orders placed or performed in visit on 04/18/15  Comprehensive Metabolic Panel (CMET)  Result Value Ref Range   Glucose 105 (H) 65 - 99 mg/dL   BUN 19 6 - 20 mg/dL   Creatinine, Ser 1.27 0.76 - 1.27 mg/dL   GFR calc non Af Amer 72 >59 mL/min/1.73   GFR calc Af Amer 83 >59 mL/min/1.73    BUN/Creatinine Ratio 15 8 - 19   Sodium 136 134 - 144 mmol/L   Potassium 5.0 3.5 - 5.2 mmol/L   Chloride 95 (L) 97 - 108 mmol/L   CO2 22 18 - 29 mmol/L   Calcium 10.3 (H) 8.7 - 10.2 mg/dL   Total Protein 8.3 6.0 - 8.5 g/dL   Albumin 5.1 3.5 - 5.5 g/dL   Globulin, Total 3.2 1.5 - 4.5 g/dL   Albumin/Globulin Ratio 1.6 1.1 - 2.5   Bilirubin Total 0.5 0.0 - 1.2 mg/dL   Alkaline Phosphatase 89 39 - 117 IU/L   AST 20 0 - 40 IU/L   ALT 24 0 - 44 IU/L  CBC with Differential  Result Value Ref Range   WBC 4.2 3.4 - 10.8 x10E3/uL   RBC 5.15 4.14 - 5.80 x10E6/uL   Hemoglobin 14.9 12.6 - 17.7 g/dL   Hematocrit 44.4 37.5 - 51.0 %   MCV 86 79 - 97 fL   MCH 28.9 26.6 - 33.0 pg   MCHC 33.6 31.5 - 35.7 g/dL   RDW 14.7 12.3 - 15.4 %   Platelets 383 (H) 150 - 379 x10E3/uL   Neutrophils 45 %   Lymphs 44 %   Monocytes 9 %   Eos 2 %   Basos 0 %   Neutrophils Absolute 1.9 1.4 - 7.0 x10E3/uL   Lymphocytes Absolute 1.8 0.7 - 3.1 x10E3/uL   Monocytes Absolute 0.4 0.1 - 0.9 x10E3/uL   EOS (ABSOLUTE) 0.1 0.0 - 0.4 x10E3/uL   Basophils Absolute 0.0 0.0 - 0.2 x10E3/uL   Immature Granulocytes 0 %   Immature Grans (Abs) 0.0 0.0 - 0.1 x10E3/uL  Lipid Profile  Result Value Ref Range   Cholesterol, Total 263 (H) 100 - 199 mg/dL   Triglycerides 133 0 - 149 mg/dL   HDL 50 >39 mg/dL   VLDL Cholesterol Cal 27 5 - 40 mg/dL   LDL Calculated 186 (H) 0 - 99 mg/dL   Chol/HDL Ratio 5.3 (H) 0.0 - 5.0 ratio units  TSH  Result Value Ref Range   TSH 0.845 0.450 - 4.500 uIU/mL  HgB A1c  Result Value Ref Range   Hgb A1c MFr Bld 6.7 (H) 4.8 - 5.6 %   Est. average glucose Bld gHb Est-mCnc 146 mg/dL      Assessment & Plan:   Problem List Items Addressed This Visit    Mass of nasal sinus   Relevant Orders   Ambulatory referral to ENT   Hypertension    Chronic problem, stable, on medication Check future chemistry before next visit within 5 months      Relevant Orders   COMPLETE METABOLIC PANEL WITH GFR    Hyperlipidemia    Abnormal lipids with high TG, LDL, last checked 03/2015 Not on statin therapy Check future fasting lipids before next visit 01/2017      Relevant Orders   Lipid panel   Environmental and seasonal allergies   Relevant Medications   olopatadine (PATANOL) 0.1 % ophthalmic solution   Elevated  serum creatinine    Last Cr 1.27 (03/2015), no other for comparison baseline, does have large muscle mass, likely contributing. Chronic history of HTN, prior elevated sugar without dx DM Check future fasting labs including chemistry follow-up      Relevant Orders   COMPLETE METABOLIC PANEL WITH GFR   Abnormal glucose    Chart review with A1c 6.7 (03/2015), and abnormal glucose 105. Patient denies known history of DM or Pre-DM, not on therapy Check future fasting labs and A1c before next visit 01/2017      Relevant Orders   Hemoglobin A1c    Other Visit Diagnoses    Acute conjunctivitis of right eye, unspecified acute conjunctivitis type    -  Primary   Relevant Medications   ofloxacin (OCUFLOX) 0.3 % ophthalmic solution   Vitamin D deficiency       Relevant Orders   VITAMIN D 25 Hydroxy (Vit-D Deficiency, Fractures)   Thrombocytosis (HCC)       Relevant Orders   CBC with Differential/Platelet      Meds ordered this encounter  Medications  . ofloxacin (OCUFLOX) 0.3 % ophthalmic solution    Sig: Place 2 drops into the right eye 4 (four) times daily. For 5-7 days    Dispense:  5 mL    Refill:  0  . olopatadine (PATANOL) 0.1 % ophthalmic solution    Sig: Place 1 drop into both eyes daily as needed for allergies.    Dispense:  5 mL    Refill:  2      Follow up plan: Return in about 2 weeks (around 10/11/2016), or if symptoms worsen or fail to improve, for conjunctivitis.  Nobie Putnam, DO [facility] 09/27/2016, 12:31 PM

## 2016-09-27 NOTE — Assessment & Plan Note (Signed)
Abnormal lipids with high TG, LDL, last checked 03/2015 Not on statin therapy Check future fasting lipids before next visit 01/2017

## 2016-09-27 NOTE — Assessment & Plan Note (Signed)
Chronic problem, stable, on medication Check future chemistry before next visit within 5 months

## 2016-09-27 NOTE — Patient Instructions (Signed)
Thank you for coming in to clinic today.  1. Most likely you have Pink eye, possibly virus vs bacterial - Coverage with stronger antibiotic eye drop, 2 drops 4 times a day for 5-7 days, if cost is too much let me know we can send in Erythromycin antibiotic ointment, which does work well also - Also sent rx for anti-allergy eye drop, use this in each eye once daily as needed for itchy watery eyes, especially if outdoor frequently  For Right deeper nasal mass/polyp. I think this needs to be biopsied or removed, to make sure it is not anything serious.  [address]  [place], [place] [address] Ph: [phone]  [facility] [place] [address]  [place], [place] [address] Ph: [phone]  They will call you with apt, if you don't hear back within next 2 weeks, give them a call to check status  You will be due for FASTING BLOOD WORK (no food or drink after midnight before, only water or coffee without cream/sugar on the morning of)  - Please go ahead and schedule a "Lab Only" visit in the morning at the clinic for lab draw in  - Make sure Lab Only appointment is at least 1-2 weeks before your next appointment, so that results will be available  For Lab Results, once available within 2-3 days of blood draw, you can can log in to MyChart online to view your results and a brief explanation. Also, we can discuss results at next follow-up visit.  Please schedule a follow-up appointment with Dr. Parks Ranger in 2 weeks as needed for conjunctivitis  If you have any other questions or concerns, please feel free to call the clinic or send a message through [place]. You may also schedule an earlier appointment if necessary.  Nobie Putnam, DO [place]

## 2016-09-27 NOTE — Assessment & Plan Note (Signed)
Chart review with A1c 6.7 (03/2015), and abnormal glucose 105. Patient denies known history of DM or Pre-DM, not on therapy Check future fasting labs and A1c before next visit 01/2017

## 2016-09-27 NOTE — Assessment & Plan Note (Signed)
Last Cr 1.27 (03/2015), no other for comparison baseline, does have large muscle mass, likely contributing. Chronic history of HTN, prior elevated sugar without dx DM Check future fasting labs including chemistry follow-up

## 2016-10-01 ENCOUNTER — Telehealth: Payer: Self-pay | Admitting: Family Medicine

## 2016-10-01 DIAGNOSIS — H1031 Unspecified acute conjunctivitis, right eye: Secondary | ICD-10-CM

## 2016-10-01 MED ORDER — ERYTHROMYCIN 5 MG/GM OP OINT: 1.0000 "application " | TOPICAL_OINTMENT | Freq: Four times a day (QID) | OPHTHALMIC | 0 refills | Status: DC

## 2016-10-01 NOTE — Telephone Encounter (Signed)
Last visit on 12/29, unclear exactly etiology but suspected more viral conjunctivitis, already given empiric coverge with Oxfloxacin eye drops, has used for 4 days, as advised it may need to run it's course, this is one of strongest eye drop antibiotics, we can try alternative with eye ointment with erythromycin instead, sent new rx to pharmacy, use 4 times a day for 1 week.  If symptoms still not improved over next 2-3 days, he can notify office again, and we will work on ophthalmology referral for further management, as I have limited other options at that point.  If significant worsening with eye pain, redness or swelling of skin on eyelids or surrounding, change or loss of vision, he needs to go directly to Emergency Department instead and have it evaluated.  Nobie Putnam, DO [facility] 10/01/2016, 1:40 PM

## 2016-10-01 NOTE — Telephone Encounter (Signed)
Pt's pink eye is not any better.  Is there something stronger that call be called in to [facility] in [place].  His call back number is [phone]

## 2016-10-31 ENCOUNTER — Other Ambulatory Visit: Payer: Self-pay | Admitting: Family Medicine

## 2016-10-31 DIAGNOSIS — I10 Essential (primary) hypertension: Secondary | ICD-10-CM

## 2016-10-31 DIAGNOSIS — F419 Anxiety disorder, unspecified: Secondary | ICD-10-CM

## 2017-01-27 ENCOUNTER — Other Ambulatory Visit: Payer: Self-pay

## 2017-01-27 DIAGNOSIS — J45909 Unspecified asthma, uncomplicated: Secondary | ICD-10-CM

## 2017-01-27 MED ORDER — ALBUTEROL SULFATE HFA 108 (90 BASE) MCG/ACT IN AERS: 2.0000 | INHALATION_SPRAY | Freq: Four times a day (QID) | RESPIRATORY_TRACT | 12 refills | Status: DC | PRN

## 2017-01-27 NOTE — Telephone Encounter (Signed)
Pharmacy requesting refill Last ov 09/27/16  Last filled 08/23/15 Please review. Thank you. sd

## 2017-02-21 ENCOUNTER — Encounter: Payer: Self-pay | Admitting: Family Medicine

## 2017-02-21 ENCOUNTER — Ambulatory Visit (INDEPENDENT_AMBULATORY_CARE_PROVIDER_SITE_OTHER): Payer: BC Managed Care – PPO | Admitting: Family Medicine

## 2017-02-21 VITALS — BP 128/69 | HR 82 | Temp 98.3°F | Resp 16 | Ht 75.0 in | Wt 321.0 lb

## 2017-02-21 DIAGNOSIS — J3089 Other allergic rhinitis: Secondary | ICD-10-CM | POA: Diagnosis not present

## 2017-02-21 DIAGNOSIS — E782 Mixed hyperlipidemia: Secondary | ICD-10-CM

## 2017-02-21 DIAGNOSIS — I10 Essential (primary) hypertension: Secondary | ICD-10-CM | POA: Diagnosis not present

## 2017-02-21 DIAGNOSIS — R7309 Other abnormal glucose: Secondary | ICD-10-CM | POA: Diagnosis not present

## 2017-02-21 DIAGNOSIS — J3489 Other specified disorders of nose and nasal sinuses: Secondary | ICD-10-CM

## 2017-02-21 DIAGNOSIS — J309 Allergic rhinitis, unspecified: Secondary | ICD-10-CM | POA: Insufficient documentation

## 2017-02-21 MED ORDER — IPRATROPIUM BROMIDE 0.06 % NA SOLN: 2.0000 | Freq: Four times a day (QID) | NASAL | 1 refills | Status: DC

## 2017-02-21 NOTE — Progress Notes (Signed)
Subjective:    Patient ID: Aaron Schwartz, male    DOB: [birth year], 36 y.o.   MRN: [id number]  Aaron Schwartz is a 36 y.o. 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 Worsening allergy symptoms - Taking Cetirizine 10mg , Still using Flonase regularly - Last visit 08/2016 he was referred to [facility] for a R nasal mass with irregularity vs polyp, he did not schedule this apt, asks if can re-reschedule now more free time in summer - Denies fever/chills, sinus pain pressure ear pain and pressure  CHRONIC HTN: Reports no new concerns. No checking BP regularly Current Meds - Lisinopril 20mg  daily   Reports good compliance, took meds today. Tolerating well, w/o complaints. Lifestyle - active exercising Denies CP, dyspnea, HA, edema, dizziness / lightheadedness  PMH - Elevated A1c / Abnormal glucose, HLD - Did not obtain labs since last visit  Social History  Substance Use Topics  . Smoking status: Never Smoker  . Smokeless tobacco: Never Used  . Alcohol use Yes     Comment: occasionally     Review of Systems Per HPI unless specifically indicated above     Objective:    BP 128/69   Pulse 82   Temp 98.3 F (36.8 C) (Oral)   Resp 16   Ht 6\' 3"  (1.905 m)   Wt (!) 321 lb (145.6 kg)   BMI 40.12 kg/m   Wt Readings from Last 3 Encounters:  02/21/17 (!) 321 lb (145.6 kg)  09/27/16 (!) 319 lb 9.6 oz (145 kg)  08/20/16 (!) 310 lb (140.6 kg)    Physical Exam  Constitutional: He is oriented to person, place, and time. He appears well-developed and well-nourished. No distress.  Well-appearing, comfortable, cooperative, larger muscular body habitus  HENT:  Head: Normocephalic and atraumatic.  Mouth/Throat: Oropharynx is clear and moist.  Frontal / maxillary sinuses non-tender. Nares with mild bilateral turbinate  edema and congestion without purulence.  Right nasal cavity deeper in viewable with otoscope with persistent (unchanged) Right lateral wall moderately sized nasal mass with dried blood and crusty abnormal mucosal tissue appearance.  Bilateral TMs clear without erythema, effusion or bulging. Oropharynx clear without erythema, exudates, edema or asymmetry.  Eyes: Conjunctivae are normal. Right eye exhibits no discharge. Left eye exhibits no discharge.  Neck: Normal range of motion. Neck supple.  Cardiovascular: Normal rate, regular rhythm, normal heart sounds and intact distal pulses.   No murmur heard. Pulmonary/Chest: Effort normal and breath sounds normal. No respiratory distress. He has no wheezes. He has no rales.  Musculoskeletal: He exhibits no edema.  Neurological: He is alert and oriented to person, place, and time.  Skin: Skin is warm and dry. No rash noted. He is not diaphoretic. No erythema.  Psychiatric: He has a normal mood and affect. His behavior is normal.  Nursing note and vitals reviewed.      Assessment & Plan:   Problem List Items Addressed This Visit    Mass of nasal sinus    Never followed up with referral to [facility], did not schedule Persistent nasal mass today, seems less irritated and less bleeding  Advised patient to call [facility] and see if can re-schedule otherwise, will need new referral      Hypertension - Primary    Stable controlled BP Elevated Cr on prior labs, possible complication CKD, vs high baseline  Cr with larger muscle mass  Plan: 1. Continue current therapy, Lisinopril 20mg  2. Encourage improve lifestyle diet, low sodium 3. Monitor BP outside office if able to 4. Follow-up 6 weeks for Annual Physical - labs already ordered future, chemistry, A1c lipids      Hyperlipidemia    Abnormal lipids with high TG, LDL, last checked 03/2015 Not on statin therapy Check future fasting lipids before next visit 6 weeks annual physical       Allergic rhinitis    Recent worsening allergies and allergic rhinitis in setting of inc outdoor environmental exposure  Plan: 1. Start Atrovent nasal spray decongestant 2 sprays in each nostril up to 4 times daily for 7 days 2. Continue Cetirizine, Flonase 3. Follow-up PRN      Relevant Medications   ipratropium (ATROVENT) 0.06 % nasal spray   Abnormal glucose    Chart review with A1c 6.7 (03/2015), and abnormal glucose 105. Patient denies known history of DM or Pre-DM, not on therapy  Check future labs still, plan annual physical 6 weeks - discuss potential new dx DM vs Pre-DM         Meds ordered this encounter  Medications  . ipratropium (ATROVENT) 0.06 % nasal spray    Sig: Place 2 sprays into both nostrils 4 (four) times daily. For up to 5-7 days then stop.    Dispense:  15 mL    Refill:  1     Follow up plan: Return in about 6 weeks (around 04/04/2017) for Annual Physical.  Nobie Putnam, DO [facility] 02/22/2017, 10:42 AM

## 2017-02-21 NOTE — Patient Instructions (Addendum)
Thank you for coming to the clinic today.  1. Start Atrovent nasal spray decongestant 2 sprays in each nostril up to 4 times daily for 7 days - Use as needed  Continue Cetirizine, Flonase  2.   For Right deeper nasal mass/polyp. I think this needs to be biopsied or removed, to make sure it is not anything serious.  Let me know if need new referral  [facility] [address]  [place], [place] [address] Ph: [phone]  [facility] [place] [address]  [place], [place] [address] Ph: [phone]  You will be due for FASTING BLOOD WORK (no food or drink after midnight before, only water or coffee without cream/sugar on the morning of)  - Please go ahead and schedule a "Lab Only" visit in the morning at the clinic for lab draw in 6 weeks  For Lab Results, once available within 2-3 days of blood draw, you can can log in to MyChart online to view your results and a brief explanation. Also, we can discuss results at next follow-up visit.  Please schedule a follow-up appointment with Dr. Parks Ranger In 6 weeks for Annual Physical  If you have any other questions or concerns, please feel free to call the clinic or send a message through [place]. You may also schedule an earlier appointment if necessary.  Nobie Putnam, DO [place]

## 2017-02-22 NOTE — Assessment & Plan Note (Signed)
Recent worsening allergies and allergic rhinitis in setting of inc outdoor environmental exposure  Plan: 1. Start Atrovent nasal spray decongestant 2 sprays in each nostril up to 4 times daily for 7 days 2. Continue Cetirizine, Flonase 3. Follow-up PRN

## 2017-02-22 NOTE — Assessment & Plan Note (Signed)
Stable controlled BP Elevated Cr on prior labs, possible complication CKD, vs high baseline Cr with larger muscle mass  Plan: 1. Continue current therapy, Lisinopril 20mg  2. Encourage improve lifestyle diet, low sodium 3. Monitor BP outside office if able to 4. Follow-up 6 weeks for Annual Physical - labs already ordered future, chemistry, A1c lipids

## 2017-02-22 NOTE — Assessment & Plan Note (Signed)
Abnormal lipids with high TG, LDL, last checked 03/2015 Not on statin therapy Check future fasting lipids before next visit 6 weeks annual physical

## 2017-02-22 NOTE — Assessment & Plan Note (Signed)
Never followed up with referral to [facility], did not schedule Persistent nasal mass today, seems less irritated and less bleeding  Advised patient to call [facility] and see if can re-schedule otherwise, will need new referral

## 2017-02-22 NOTE — Assessment & Plan Note (Signed)
Chart review with A1c 6.7 (03/2015), and abnormal glucose 105. Patient denies known history of DM or Pre-DM, not on therapy  Check future labs still, plan annual physical 6 weeks - discuss potential new dx DM vs Pre-DM

## 2017-03-31 ENCOUNTER — Other Ambulatory Visit: Payer: BC Managed Care – PPO

## 2017-03-31 DIAGNOSIS — I10 Essential (primary) hypertension: Secondary | ICD-10-CM

## 2017-03-31 DIAGNOSIS — D473 Essential (hemorrhagic) thrombocythemia: Secondary | ICD-10-CM

## 2017-03-31 DIAGNOSIS — E782 Mixed hyperlipidemia: Secondary | ICD-10-CM

## 2017-03-31 DIAGNOSIS — R7309 Other abnormal glucose: Secondary | ICD-10-CM

## 2017-03-31 DIAGNOSIS — E559 Vitamin D deficiency, unspecified: Secondary | ICD-10-CM

## 2017-03-31 DIAGNOSIS — D75839 Thrombocytosis, unspecified: Secondary | ICD-10-CM

## 2017-03-31 DIAGNOSIS — R7989 Other specified abnormal findings of blood chemistry: Secondary | ICD-10-CM

## 2017-04-01 LAB — COMPLETE METABOLIC PANEL WITH GFR
ALT: 16 U/L (ref 9–46)
AST: 15 U/L (ref 10–40)
Albumin: 4.4 g/dL (ref 3.6–5.1)
Alkaline Phosphatase: 75 U/L (ref 40–115)
BUN: 14 mg/dL (ref 7–25)
CHLORIDE: 102 mmol/L (ref 98–110)
CO2: 27 mmol/L (ref 20–31)
Calcium: 9.5 mg/dL (ref 8.6–10.3)
Creat: 1.24 mg/dL (ref 0.60–1.35)
GFR, EST NON AFRICAN AMERICAN: 73 mL/min (ref >=60)
GFR, Est African American: 85 mL/min (ref >=60)
GLUCOSE: 111 mg/dL — AB (ref 65–99)
POTASSIUM: 4.9 mmol/L (ref 3.5–5.3)
SODIUM: 136 mmol/L (ref 135–146)
Total Bilirubin: 0.4 mg/dL (ref 0.2–1.2)
Total Protein: 7.4 g/dL (ref 6.1–8.1)

## 2017-04-01 LAB — CBC WITH DIFFERENTIAL/PLATELET
BASOS PCT: 1 %
Basophils Absolute: 44 cells/uL (ref 0–200)
EOS ABS: 220 {cells}/uL (ref 15–500)
Eosinophils Relative: 5 %
HEMATOCRIT: 43.7 % (ref 38.5–50.0)
Hemoglobin: 14.2 g/dL (ref 13.2–17.1)
LYMPHS PCT: 45 %
Lymphs Abs: 1980 cells/uL (ref 850–3900)
MCH: 28.9 pg (ref 27.0–33.0)
MCHC: 32.5 g/dL (ref 32.0–36.0)
MCV: 89 fL (ref 80.0–100.0)
MONO ABS: 352 {cells}/uL (ref 200–950)
MPV: 9.5 fL (ref 7.5–12.5)
Monocytes Relative: 8 %
Neutro Abs: 1804 cells/uL (ref 1500–7800)
Neutrophils Relative %: 41 %
Platelets: 358 10*3/uL (ref 140–400)
RBC: 4.91 MIL/uL (ref 4.20–5.80)
RDW: 14.7 % (ref 11.0–15.0)
WBC: 4.4 10*3/uL (ref 3.8–10.8)

## 2017-04-01 LAB — LIPID PANEL
Cholesterol: 210 mg/dL — ABNORMAL HIGH (ref <200)
HDL: 45 mg/dL (ref >40)
LDL CALC: 145 mg/dL — AB (ref <100)
Total CHOL/HDL Ratio: 4.7 Ratio (ref <5.0)
Triglycerides: 102 mg/dL (ref <150)
VLDL: 20 mg/dL (ref <30)

## 2017-04-01 LAB — HEMOGLOBIN A1C
HEMOGLOBIN A1C: 6.8 % — AB (ref <5.7)
Mean Plasma Glucose: 148 mg/dL

## 2017-04-01 LAB — VITAMIN D 25 HYDROXY (VIT D DEFICIENCY, FRACTURES): Vit D, 25-Hydroxy: 18 ng/mL — ABNORMAL LOW (ref 30–100)

## 2017-04-03 ENCOUNTER — Encounter: Payer: Self-pay | Admitting: Family Medicine

## 2017-04-03 ENCOUNTER — Ambulatory Visit (INDEPENDENT_AMBULATORY_CARE_PROVIDER_SITE_OTHER): Payer: BC Managed Care – PPO | Admitting: Family Medicine

## 2017-04-03 VITALS — BP 113/60 | HR 72 | Temp 98.3°F | Resp 16 | Ht 75.0 in | Wt 321.0 lb

## 2017-04-03 DIAGNOSIS — Z0000 Encounter for general adult medical examination without abnormal findings: Secondary | ICD-10-CM | POA: Diagnosis not present

## 2017-04-03 DIAGNOSIS — F419 Anxiety disorder, unspecified: Secondary | ICD-10-CM | POA: Diagnosis not present

## 2017-04-03 DIAGNOSIS — L308 Other specified dermatitis: Secondary | ICD-10-CM

## 2017-04-03 DIAGNOSIS — E119 Type 2 diabetes mellitus without complications: Secondary | ICD-10-CM

## 2017-04-03 DIAGNOSIS — E559 Vitamin D deficiency, unspecified: Secondary | ICD-10-CM | POA: Insufficient documentation

## 2017-04-03 DIAGNOSIS — J0100 Acute maxillary sinusitis, unspecified: Secondary | ICD-10-CM | POA: Diagnosis not present

## 2017-04-03 DIAGNOSIS — I10 Essential (primary) hypertension: Secondary | ICD-10-CM

## 2017-04-03 LAB — POCT UA - MICROALBUMIN: Microalbumin Ur, POC: 0 mg/L

## 2017-04-03 MED ORDER — FLUOXETINE HCL 40 MG PO CAPS: 40.0000 mg | ORAL_CAPSULE | Freq: Every day | ORAL | 3 refills | Status: DC

## 2017-04-03 MED ORDER — FLUOCINOLONE ACETONIDE SCALP 0.01 % EX OIL: 1.0000 "application " | TOPICAL_OIL | Freq: Two times a day (BID) | CUTANEOUS | 6 refills | Status: DC

## 2017-04-03 MED ORDER — VITAMIN D3 125 MCG (5000 UT) PO CAPS: 5000.0000 [IU] | ORAL_CAPSULE | Freq: Every day | ORAL | 0 refills | Status: DC

## 2017-04-03 MED ORDER — AMOXICILLIN-POT CLAVULANATE 875-125 MG PO TABS: 1.0000 | ORAL_TABLET | Freq: Two times a day (BID) | ORAL | 0 refills | Status: DC

## 2017-04-03 MED ORDER — METFORMIN HCL ER 500 MG PO TB24: 500.0000 mg | ORAL_TABLET | Freq: Every day | ORAL | 5 refills | Status: DC

## 2017-04-03 MED ORDER — TRIAMCINOLONE ACETONIDE 0.5 % EX OINT: 1.0000 "application " | TOPICAL_OINTMENT | Freq: Two times a day (BID) | CUTANEOUS | 5 refills | Status: DC

## 2017-04-03 NOTE — Assessment & Plan Note (Signed)
Well-controlled HTN - Home BP readings none  No known complications, elevated Cr is less likely to be CKD but possible, suspect it is high baseline Cr with body type   Plan:  1. Continue current BP regimen - Lisinopril 20mg  daily 2. Encourage improved lifestyle - low sodium diet, regular exercise 3. Start monitor BP outside office, bring readings to next visit, if persistently >140/90 or new symptoms notify office sooner 4. Follow-up 3 mo

## 2017-04-03 NOTE — Assessment & Plan Note (Addendum)
New diagnosis type 2 DM with A1c 6.7 and 6.8, currently within range of goal for DM No known complications or hypoglycemia. - elevated Cr, seems baseline - Urine microalbumin today (0, negative), continue ACEi  Plan:  1. Discussion today on new diagnosis DM2, management, prognosis and complications 2. Start Metformin XR 500mg  daily - mutual decision may come off meds within 6-12 months if doing very well lifestyle 2. Encourage improved lifestyle - start low carb, low sugar diet, reduce portion size, continue improving cardio regular exercise - handouts given on DM diet, he declined referral to DM education 3. No CBG monitoring at this time, consider in future, or may do OTC glucometer 4. Continue ACEi - brief counseling on ASA and Statin, age < 26 will hold for now, lipids may need but will try lifestyle first 5. Defer DM Foot exam and Eye exam for now - he may find new eye doctor for yearly exam or can get referral if needed 6. Follow-up 3 months A1c

## 2017-04-03 NOTE — Assessment & Plan Note (Signed)
New dx low vit D 18 Start OTC Vitamin D3 5,000 iu daily for 12 weeks, then 2k daily maintenance Re-check Vitamin D in 3 months

## 2017-04-03 NOTE — Patient Instructions (Addendum)
Thank you for coming to the clinic today.  1. New diabetes type 2, A1c 6.8, goal is < 7.0 in diabetes - Work on improving diet and exercise as discussed - You should eventually feel improved energy, less lethargy, less urination, and better hydration  2. Start new medicine Metformin (extended release 24 hour pill) 500mg  daily in morning with breakfast everyday, in future can adjust dose if needed. May have GI upset, always take with food.  3. Start Vitamin D3 5,000 unit pill daily for 12 weeks (3 months) - then reduce Vitamin D3 2,000 unit pill daily maintenance  4. BP is controlled  5. Cholesterol is improved from last time, no significant need for cholesterol medicine at this time, as diabetic in future we may discuss starting cholesterol med for prevention of problem  (will check A1c, TSH for thyroid, Vitamin D)   For sinuses - Will treat with Augmentin antibiotic 10 days - May take Mucinex as well for 7-10 days to clear congestion - Follow-up with ENT as advised  You will be due for Non fasting BLOOD WORK (no food or drink after midnight before, only water or coffee without cream/sugar on the morning of)  - Please go ahead and schedule a "Lab Only" visit in the morning at the clinic for lab draw in 3 months  - Make sure Lab Only appointment is at least 1-2 weeks before your next appointment, so that results will be available  For Lab Results, once available within 2-3 days of blood draw, you can can log in to MyChart online to view your results and a brief explanation. Also, we can discuss results at next follow-up visit.   Please schedule a Follow-up Appointment to: Return in about 3 months (around 07/04/2017) for Diabetes Vit D deficiency.  If you have any other questions or concerns, please feel free to call the clinic or send a message through [place]. You may also schedule an earlier appointment if necessary.  Additionally, you may be receiving a survey about your  experience at our clinic within a few days to 1 week by e-mail or mail. We value your feedback.  Nobie Putnam, DO [place]

## 2017-04-03 NOTE — Assessment & Plan Note (Signed)
Refilled topicals for PRN use

## 2017-04-03 NOTE — Assessment & Plan Note (Signed)
Refilled Prozac, continue stable

## 2017-04-03 NOTE — Progress Notes (Signed)
Subjective:    Patient ID: Aaron Schwartz, male    DOB: [birth year], 36 y.o.   MRN: [id number]  Aaron Schwartz is a 36 y.o. male presenting on 04/03/2017 for Annual Exam   HPI   Here for Annual Physical. Had labs recently, here for lab results today.  CHRONIC DM, Type 2: - New Diagnosis - He was somewhat expecting news about new diagnosis of DM, given his family history, and he thought something was wrong with some gradual worsening poor energy, admits with muscle cramping, polyuria as well CBGs: Never, does not have glucometer Meds: Never Currently on ACEi Lifestyle: - Diet (Drinking mostly water, but during summer with football drank more gatorade, some mt dew, plans to increase water. 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  Allergies / Sinusitis: - Reports chronic allergies and rhinitis, given atrovent last visit 1 month ago for PRN use mild relief, also taking Flonase regularly and allergy pill. He is outdoors a lot with football practice. He has not seen ENT yet, will re-schedule this apt. Recently admits to more sinus pain and pressure, thicker congestion. - Denies fevers/chills  History of Eczema - Request refill on topicals for scalp oil and body cream, improved with PRN use, worse in summer heat and excessive moisture  CHRONIC HTN: Reports no new concerns. No checking BP regularly Current Meds - Lisinopril 20mg  daily   Reports good compliance, took meds today. Tolerating well, w/o complaints. Lifestyle - active exercising  Vitamin D Deficiency: - Recent labs showed Vitamin D at 18, no prior diagnosis, he has had problem with lethargy and  fatigue recently over months. Has not taken supplement. He does get sun exposure.  Health Maintenance: - Due for DM Foot exam and Ophtho exam - deferred until next apt, since new dx DM today  Depression screen [facility] 2/9 03/25/2016 04/18/2015  Decreased Interest 0 0  Down, Depressed, Hopeless 0 0  PHQ - 2 Score 0 0   GAD 7 : Generalized Anxiety Score 04/03/2017 02/21/2017 08/20/2016  Nervous, Anxious, on Edge 1 1 1   Control/stop worrying 1 1 0  Worry too much - different things 1 2 0  Trouble relaxing 1 1 1   Restless 1 1 0  Easily annoyed or irritable 1 1 1   Afraid - awful might happen 0 0 0  Total GAD 7 Score 6 7 3   Anxiety Difficulty Not difficult at all Not difficult at all Not difficult at all     Past Medical History:  Diagnosis Date  . Allergy   . Asthma   . Depression   . Hypertension    Past Surgical History:  Procedure Laterality Date  . none     Social History   Social History  . Marital status: Unknown    Spouse name: N/A  . Number of children: N/A  . Years of education: N/A   Occupational History  . Not on file.   Social History Main Topics  . Smoking status: Never Smoker  . Smokeless tobacco: Never Used  . Alcohol use Yes     Comment: occasionally   . Drug use: No  . Sexual activity: Not on file  Other Topics Concern  . Not on file   Social History Narrative  . No narrative on file   Family History  Problem Relation Age of Onset  . Hypertension Mother    Current Outpatient Prescriptions on File Prior to Visit  Medication Sig  . albuterol (PROVENTIL HFA;VENTOLIN HFA) 108 (90 Base) MCG/ACT inhaler Inhale 2 puffs into the lungs every 6 (six) hours as needed for wheezing or shortness of breath.  . cetirizine (ZYRTEC) 10 MG tablet TAKE 1 TABLET BY MOUTH EVERY DAY  . fluocinonide cream (LIDEX) 6.96 % Apply 1 application topically 2 (two) times daily.  . fluticasone (FLONASE) 50 MCG/ACT nasal spray Place 2 sprays into both nostrils daily.  .  Fluticasone-Salmeterol (ADVAIR) 250-50 MCG/DOSE AEPB Inhale 1 puff into the lungs 2 (two) times daily.  Marland Kitchen ipratropium (ATROVENT) 0.06 % nasal spray Place 2 sprays into both nostrils 4 (four) times daily. For up to 5-7 days then stop.  Marland Kitchen lisinopril (PRINIVIL,ZESTRIL) 20 MG tablet Take 1 tablet (20 mg total) by mouth daily.  Marland Kitchen olopatadine (PATANOL) 0.1 % ophthalmic solution Place 1 drop into both eyes daily as needed for allergies.   No current facility-administered medications on file prior to visit.     Review of Systems  Constitutional: Positive for fatigue. Negative for activity change, appetite change, chills, diaphoresis and fever.  HENT: Positive for congestion, postnasal drip and sinus pressure. Negative for hearing loss.   Eyes: Negative for visual disturbance.  Respiratory: Negative for cough, chest tightness, shortness of breath and wheezing.   Cardiovascular: Negative for chest pain, palpitations and leg swelling.  Gastrointestinal: Negative for abdominal pain, anal bleeding, blood in stool, constipation, diarrhea, nausea and vomiting.  Endocrine: Positive for polydipsia and polyuria. Negative for cold intolerance.  Genitourinary: Positive for frequency. Negative for decreased urine volume, difficulty urinating, dysuria and hematuria.  Musculoskeletal: Negative for arthralgias and neck pain.  Skin: Negative for rash.  Allergic/Immunologic: Positive for environmental allergies.  Neurological: Negative for dizziness, weakness, light-headedness, numbness and headaches.  Hematological: Negative for adenopathy.  Psychiatric/Behavioral: Negative for behavioral problems, dysphoric mood and sleep disturbance. The patient is not nervous/anxious.    Per HPI unless specifically indicated above     Objective:    BP 113/60   Pulse 72   Temp 98.3 F (36.8 C) (Oral)   Resp 16   Ht 6\' 3"  (1.905 m)   Wt (!) 321 lb (145.6 kg)   BMI 40.12 kg/m   Wt Readings from Last 3 Encounters:    04/03/17 (!) 321 lb (145.6 kg)  02/21/17 (!) 321 lb (145.6 kg)  09/27/16 (!) 319 lb 9.6 oz (145 kg)    Physical Exam  Constitutional: He is oriented to person, place, and time. He appears well-developed and well-nourished. No distress.  Well-appearing, comfortable, cooperative, muscular athletic build but overweight  HENT:  Head: Normocephalic and atraumatic.  Mouth/Throat: Oropharynx is clear and moist.  Frontal non tender. maxillary sinuses mild tender. Nares patent with some turbinate edema and R turbinate with lateral abnormal mass tissue previously seen. No purulence. Bilateral TMs clear without erythema, effusion or bulging. Oropharynx clear without erythema, exudates, edema or asymmetry.  Eyes: Conjunctivae and EOM are normal. Pupils are equal, round, and reactive to light. Right eye exhibits no discharge. Left eye exhibits no discharge.  Neck: Normal range of motion. Neck supple. No thyromegaly present.  No carotid bruits  Cardiovascular: Normal rate, regular rhythm, normal heart sounds and intact distal pulses.   No murmur heard.  Pulmonary/Chest: Effort normal and breath sounds normal. No respiratory distress. He has no wheezes. He has no rales.  Abdominal: Soft. Bowel sounds are normal. He exhibits no distension and no mass. There is no tenderness.  Musculoskeletal: Normal range of motion. He exhibits no edema or tenderness.  Upper / Lower Extremities: - Normal muscle tone, strength bilateral upper extremities 5/5, lower extremities 5/5  Lymphadenopathy:    He has no cervical adenopathy.  Neurological: He is alert and oriented to person, place, and time.  Distal sensation intact to light touch all extremities  Skin: Skin is warm and dry. No rash noted. He is not diaphoretic. No erythema.  Psychiatric: He has a normal mood and affect. His behavior is normal.  Well groomed, good eye contact, normal speech and thoughts  Nursing note and vitals reviewed.   Results for orders  placed or performed in visit on 04/03/17  POCT UA - Microalbumin  Result Value Ref Range   Microalbumin Ur, POC 0 mg/L   Creatinine, POC  mg/dL   Albumin/Creatinine Ratio, Urine, POC      Recent Labs  03/31/17 0857  HGBA1C 6.8*        Assessment & Plan:   Problem List Items Addressed This Visit    Vitamin D deficiency    New dx low vit D 18 Start OTC Vitamin D3 5,000 iu daily for 12 weeks, then 2k daily maintenance Re-check Vitamin D in 3 months      Relevant Medications   Cholecalciferol (VITAMIN D3) 5000 units CAPS   Hypertension    Well-controlled HTN - Home BP readings none  No known complications, elevated Cr is less likely to be CKD but possible, suspect it is high baseline Cr with body type   Plan:  1. Continue current BP regimen - Lisinopril 20mg  daily 2. Encourage improved lifestyle - low sodium diet, regular exercise 3. Start monitor BP outside office, bring readings to next visit, if persistently >140/90 or new symptoms notify office sooner 4. Follow-up 3 mo      Eczema    Refilled topicals for PRN use      Relevant Medications   Fluocinolone Acetonide Scalp 0.01 % OIL   triamcinolone ointment (KENALOG) 0.5 %   Controlled type 2 diabetes mellitus without complication ([place])    New diagnosis type 2 DM with A1c 6.7 and 6.8, currently within range of goal for DM No known complications or hypoglycemia. - elevated Cr, seems baseline - Urine microalbumin today (0, negative), continue ACEi  Plan:  1. Discussion today on new diagnosis DM2, management, prognosis and complications 2. Start Metformin XR 500mg  daily - mutual decision may come off meds within 6-12 months if doing very well lifestyle 2. Encourage improved lifestyle - start low carb, low sugar diet, reduce portion size, continue improving cardio regular exercise - handouts given on DM diet, he declined referral to DM education 3. No CBG monitoring at this time, consider in future, or may do OTC  glucometer 4. Continue ACEi - brief counseling on ASA and Statin, age < 50 will hold for now, lipids may need but will try lifestyle first 5. Defer DM Foot exam and Eye exam for now - he may find new eye doctor for yearly exam or can get referral if needed 6. 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 Start Augmentin 875-125mg  PO BID x 10 days 2. Continue flonase, atrovent 3. Follow-up with ENT as advised, needs to schedule 4. Return criteria reviewed     Relevant Medications   amoxicillin-clavulanate (AUGMENTIN) 875-125 MG tablet      Meds ordered this encounter  Medications  . metFORMIN (GLUCOPHAGE XR) 500 MG 24 hr tablet    Sig: Take 1 tablet (500 mg total) by mouth daily with breakfast.    Dispense:  30 tablet    Refill:  5  . Cholecalciferol (VITAMIN D3) 5000 units CAPS    Sig: Take 1 capsule (5,000 Units total) by mouth daily. For 12 weeks, then start Vitamin D3 2,000 units daily (OTC)    Dispense:  60 capsule    Refill:  0  . Fluocinolone Acetonide Scalp 0.01 % OIL    Sig: Apply 1 application topically 2 (two) times daily.    Dispense:  1 Bottle    Refill:  6  . triamcinolone ointment (KENALOG) 0.5 %    Sig: Apply 1 application topically 2 (two) times daily.    Dispense:  30 g    Refill:  5  . FLUoxetine (PROZAC) 40 MG capsule    Sig: Take 1 capsule (40 mg  total) by mouth daily.    Dispense:  90 capsule    Refill:  3  . amoxicillin-clavulanate (AUGMENTIN) 875-125 MG tablet    Sig: Take 1 tablet by mouth 2 (two) times daily. For 10 days    Dispense:  20 tablet    Refill:  0    Follow up plan: Return in about 3 months (around 07/04/2017) for Diabetes Vit D deficiency.  Nobie Putnam, [facility] 04/03/2017, 4:27 PM

## 2017-06-28 ENCOUNTER — Other Ambulatory Visit: Payer: Self-pay | Admitting: Family Medicine

## 2017-06-28 DIAGNOSIS — J3089 Other allergic rhinitis: Secondary | ICD-10-CM

## 2017-08-18 ENCOUNTER — Other Ambulatory Visit: Payer: BC Managed Care – PPO

## 2017-08-20 ENCOUNTER — Encounter: Payer: Self-pay | Admitting: Family Medicine

## 2017-08-20 ENCOUNTER — Ambulatory Visit: Payer: BC Managed Care – PPO | Admitting: Family Medicine

## 2017-08-20 VITALS — BP 145/72 | HR 78 | Temp 98.6°F | Resp 16 | Ht 75.0 in | Wt 310.8 lb

## 2017-08-20 DIAGNOSIS — J209 Acute bronchitis, unspecified: Secondary | ICD-10-CM

## 2017-08-20 DIAGNOSIS — E119 Type 2 diabetes mellitus without complications: Secondary | ICD-10-CM | POA: Diagnosis not present

## 2017-08-20 DIAGNOSIS — E559 Vitamin D deficiency, unspecified: Secondary | ICD-10-CM | POA: Diagnosis not present

## 2017-08-20 DIAGNOSIS — J302 Other seasonal allergic rhinitis: Secondary | ICD-10-CM | POA: Diagnosis not present

## 2017-08-20 MED ORDER — BENZONATATE 100 MG PO CAPS: 100.0000 mg | ORAL_CAPSULE | Freq: Three times a day (TID) | ORAL | 0 refills | Status: DC | PRN

## 2017-08-20 MED ORDER — FLUTICASONE PROPIONATE 50 MCG/ACT NA SUSP: 2.0000 | Freq: Every day | NASAL | 2 refills | Status: DC

## 2017-08-20 MED ORDER — AZITHROMYCIN 250 MG PO TABS: ORAL_TABLET | ORAL | 0 refills | Status: DC

## 2017-08-20 NOTE — Assessment & Plan Note (Signed)
Remained on Vitamin D3 5,000 iu daily for 4 months now, initial improved Re-check Vitamin D 1 week, [facility], adjust dose likely reduce to 2k daily

## 2017-08-20 NOTE — Assessment & Plan Note (Signed)
Uncertain current control, prior A1c 6.8, now non adherent to metformin d/t GI intolerance, on herbal self treatment No known complications or hypoglycemia.  Plan:  1. Discussion again on DM management, goals for A1c and role of diet/exercise medications, future risk complications - Agree to remain off Metformin for now until pending upcoming A1c - Ask name of herbal med, he will notify me - Check future A1c 1 week [facility], follow-up result - may resume trial on Metformin WITH FOOD and try 1-2 weeks to see if GI symptoms resolve - If still intolerance to metformin can consider SGLT2 vs GLP1 vs Sulfonylurea if A1c < 7.0 2. Encourage improved lifestyle - low carb, low sugar diet, reduce portion size, continue improving regular exercise 3. Future may need to check CBG 4. Continue ACEi - future consider Statin / ASA age >40-50+ or pending lipid trend 5. DM Foot exam done today / Advised to schedule DM ophtho exam, send record. Handout given likely schedule at [facility] 6. Follow-up 3-6 months pending A1c trend

## 2017-08-20 NOTE — Patient Instructions (Addendum)
Thank you for coming to the clinic today.  1. It sounds like you had an Upper Respiratory Virus or Allergies that cause sinus drainage that has settled into a Bronchitis, lower respiratory tract infection. I don't have concerns for pneumonia today, and think that this should gradually improve. Once you are feeling better, the cough may take a few weeks to fully resolve.  - Start Azithromycin Z pak (antibiotic) 2 tabs day 1, then 1 tab x 4 days, complete entire course even if improved  - Keep taking Zyrtec  Start nasal steroid Flonase 2 sprays in each nostril daily for 4-6 weeks, may repeat course seasonally or as needed  Start Tessalon Perls take 1 capsule up to 3 times a day as needed for cough  - Drink plenty of fluids to improve congestion  - You may try over the counter Nasal Saline spray (Simply Saline, Ocean Spray) as needed to reduce congestion.  - May take Tylenol / Motrin  If not improved by Monday can call for prednisone if asthma seems to be flaring up worse  If your symptoms seem to worsen instead of improve over next several days, including significant fever / chills, worsening shortness of breath, worsening wheezing, or nausea / vomiting and can't take medicines - return sooner or go to hospital Emergency Department for more immediate treatment.   Printed labs for [employer] for next week - will stay tuned for results, and notify you through [place], depending on A1c, if elevated we may try again with Metformin XR 500mg  once daily with meal to avoid upset stomach for 1-2 weeks, if still not working we can reconsider.   Your provider would like to you have your annual eye exam. Please contact your current eye doctor or here are some good options for you to contact.   [facility]   Address: [address] [place], [place] [address] Phone: [phone]  Website: [email or web address] [address], [place], [place] [address] Phone: [phone] [email or web address]  [facility]  Address: [place], [place], [place] [address] Phone: [phone]   [facility] [address] [place], [place] [address] Phone: [phone]  [facility] Address: [place], [place], [place] [address]  Phone: [phone]  Please schedule a Follow-up Appointment to: Return in about 3 months (around 11/20/2017) for DM A1c.  If you have any other questions or concerns, please feel free to call the clinic or send a message through [place]. You may also schedule an earlier appointment if necessary.  Additionally, you may be receiving a survey about your experience at our clinic within a few days to 1 week by e-mail or mail. We value your feedback.  Nobie Putnam, DO [place]

## 2017-08-20 NOTE — Progress Notes (Signed)
Subjective:    Patient ID: Aaron Schwartz, male    DOB: 07-13-1981, 36 y.o.   MRN: [id number]  Aaron Schwartz is a 36 y.o. male presenting on 08/20/2017 for Diabetes (obtw pt had sinus drainge cough cold coughing at night no fever or chills some HA )   HPI   CHRONIC DM, Type 2 - Last visit with me 04/03/17, for initial visit for same problem after confirm new dx DM2 with A1c 6.7 to 6.8, treated with Metformin XR 500mg  daily and diet lifestyle intervention, see prior notes for background information. - Today patient reports update with stopped Metformin after few days due to GI intolerance, he was not sure if taking with meal, but he did not like to take the medicine and preferred more herbal approach, now has been taking herbal diabetes medicine, thinks it is improving. CBGs: Not started - does not have glucometer Meds: OFF Metformin XR 500mg  daily Currently on ACEi Lifestyle: - Diet (Drinking mostly water, but during football season drinks more gatorade. Tried to improve diet some) - Exercise (Regular with football coaching, recently ended) - Due DM Eye Exam, he does not have eye doctor, open to various locations Denies hypoglycemia, polyuria, visual changes, numbness or tingling.  Vitamin D Deficiency - Last visit with me 04/03/17, for annual visit for same problem, had low vit D at 16, treated with vitamin D3 OTC 5,000 iu daily for 12 weeks, see prior notes for background information. - Interval update with he noticed improvement initially then states had some fatigue and felt drained with multiple URI and respiratory illnesses now bronchitis due to outdoor / weather - Today patient reports he is still taking OTC Vitamin D3 5,000 iu daily, did not realize he was supposed to reduce dose down to 2k after 12 weeks.  Bronchitis / Recent URI Cough - Reports recent complaint with 3 weeks now persistent URI symptoms, previously treated for sinus in 06/2017, now had URI sinus and cough for 1-2  weeks, now sinus improved but cough is worse, seems thicker and more productive, worse at night, but difficulty actually getting rid of phlegm - Tried OTC cold medicine - History of Asthma, has been well controlled using ALbuterol PRN, no longer on Advair for >6 months, asking if needs to use this again - He stopped Flonase, finished Atrovent recently in summer, and taking Zyrtec - Denies fevers, chills, sweats, sinus or ear pain or pressure, nausea vomiting., dyspnea  Additional history: Turf toe, Left Great Toe - Recently seen at [facility] in [place] for Turf Toe problem, prior injury, L great toe, recent pain, attributes to football practice, received prednisone burst and omnicef for sinusitis. He has taken some herbal anti-inflammatory with improvement.  Health Maintenance: - Due for Flu Shot, declines today despite counseling on benefits - also patient ill today deferred   Depression screen [facility] 2/9 08/20/2017 03/25/2016 04/18/2015  Decreased Interest 1 0 0  Down, Depressed, Hopeless 1 0 0  PHQ - 2 Score 2 0 0  Altered sleeping 0 - -  Tired, decreased energy 2 - -  Change in appetite 0 - -  Feeling bad or failure about yourself  0 - -  Trouble concentrating 0 - -  Moving slowly or fidgety/restless 1 - -  Suicidal thoughts 0 - -  PHQ-9 Score 5 - -  Difficult doing work/chores Not difficult at all - -    Social History   Tobacco Use  . Smoking status: Never Smoker  .  Smokeless tobacco: Never Used  Substance Use Topics  . Alcohol use: Yes    Comment: occasionally   . Drug use: No    Review of Systems Per HPI unless specifically indicated above     Objective:    BP (!) 145/72   Pulse 78   Temp 98.6 F (37 C) (Oral)   Resp 16   Ht 6\' 3"  (1.905 m)   Wt (!) 310 lb 12.8 oz (141 kg)   BMI 38.85 kg/m   Wt Readings from Last 3 Encounters:  08/20/17 (!) 310 lb 12.8 oz (141 kg)  04/03/17 (!) 321 lb (145.6 kg)  02/21/17 (!) 321 lb (145.6 kg)    Physical Exam    Constitutional: He is oriented to person, place, and time. He appears well-developed and well-nourished. No distress.  Well-appearing, comfortable, cooperative  HENT:  Head: Normocephalic and atraumatic.  Mouth/Throat: Oropharynx is clear and moist.  Frontal / maxillary sinuses non-tender. Nares patent without purulence or edema. Bilateral TMs with some clear effusion without erythema. Oropharynx clear without erythema, exudates, edema or asymmetry.  Eyes: Conjunctivae are normal. Right eye exhibits no discharge. Left eye exhibits no discharge.  Neck: Normal range of motion. Neck supple.  Cardiovascular: Normal rate, regular rhythm, normal heart sounds and intact distal pulses.  No murmur heard. Pulmonary/Chest: Effort normal and breath sounds normal. No respiratory distress. He has no wheezes. He has no rales.  Occasional coughing, some lower rhonchi bilateral bases clear with cough. No overt wheezing. No focal crackles. Slight reduced air movement bases  Musculoskeletal: He exhibits no edema.  Lymphadenopathy:    He has no cervical adenopathy.  Neurological: He is alert and oriented to person, place, and time.  Skin: Skin is warm and dry. No rash noted. He is not diaphoretic. No erythema.  Psychiatric: He has a normal mood and affect. His behavior is normal.  Well groomed, good eye contact, normal speech and thoughts  Nursing note and vitals reviewed.  Diabetic Foot Exam - Simple   Simple Foot Form Diabetic Foot exam was performed with the following findings:  Yes 08/20/2017  4:53 PM  Visual Inspection No deformities, no ulcerations, no other skin breakdown bilaterally:  Yes Sensation Testing Intact to touch and monofilament testing bilaterally:  Yes Pulse Check Posterior Tibialis and Dorsalis pulse intact bilaterally:  Yes Comments    Results for orders placed or performed in visit on 04/03/17  POCT UA - Microalbumin  Result Value Ref Range   Microalbumin Ur, POC 0 mg/L    Creatinine, POC  mg/dL   Albumin/Creatinine Ratio, Urine, POC        Assessment & Plan:   Problem List Items Addressed This Visit    Controlled type 2 diabetes mellitus without complication ([place])    Uncertain current control, prior A1c 6.8, now non adherent to metformin d/t GI intolerance, on herbal self treatment No known complications or hypoglycemia.  Plan:  1. Discussion again on DM management, goals for A1c and role of diet/exercise medications, future risk complications - Agree to remain off Metformin for now until pending upcoming A1c - Ask name of herbal med, he will notify me - Check future A1c 1 week [facility], follow-up result - may resume trial on Metformin WITH FOOD and try 1-2 weeks to see if GI symptoms resolve - If still intolerance to metformin can consider SGLT2 vs GLP1 vs Sulfonylurea if A1c < 7.0 2. Encourage improved lifestyle - low carb, low sugar diet, reduce portion size, continue  improving regular exercise 3. Future may need to check CBG 4. Continue ACEi - future consider Statin / ASA age >40-50+ or pending lipid trend 5. DM Foot exam done today / Advised to schedule DM ophtho exam, send record. Handout given likely schedule at [facility] 6. Follow-up 3-6 months pending A1c trend      Relevant Orders   Hemoglobin A1c   Vitamin D deficiency    Remained on Vitamin D3 5,000 iu daily for 4 months now, initial improved Re-check Vitamin D 1 week, [facility], adjust dose likely reduce to 2k daily      Relevant Orders   VITAMIN D 25 Hydroxy (Vit-D Deficiency, Fractures)    Other Visit Diagnoses    Acute bronchitis, unspecified organism    -  Primary  Consistent with worsening bronchitis in setting of likely viral URI, also likely allergic rhinitis component. Concern with duration >2-3 week, recurrent episode - Afebrile, no focal signs of infection (not consistent with pneumonia by history or exam), no evidence sinusitis. No focal wheezing, but history of asthma  (remains off Advair >6 months) - Recent oral prednisone 06/2017 for turf toe  Plan: 1. Start Azithromycin Z-pak dosing 500mg  then 250mg  daily x 4 days 2. Hold additional prednisone for now caution new DM - recent course, only consider if worsening wheezing dyspnea concern asthma exac - may call back or return 3. Continue Albuterol PRN 4. Resume Zyrtec 5. Restart Flonase, new rx sent, DC Atrovent nasal 6. Start Tessalon Perls take 1 capsule up to 3 times a day as needed for cough 7. Return criteria reviewed, follow-up within 1 week if not improved     Relevant Medications   fluticasone (FLONASE) 50 MCG/ACT nasal spray   azithromycin (ZITHROMAX Z-PAK) 250 MG tablet   benzonatate (TESSALON) 100 MG capsule   Seasonal allergies       Relevant Medications   fluticasone (FLONASE) 50 MCG/ACT nasal spray      Meds ordered this encounter  Medications  . fluticasone (FLONASE) 50 MCG/ACT nasal spray    Sig: Place 2 sprays into both nostrils daily.    Dispense:  48 g    Refill:  2  . azithromycin (ZITHROMAX Z-PAK) 250 MG tablet    Sig: Take 2 tabs (500mg  total) on Day 1. Take 1 tab (250mg ) daily for next 4 days.    Dispense:  6 tablet    Refill:  0  . benzonatate (TESSALON) 100 MG capsule    Sig: Take 1 capsule (100 mg total) by mouth 3 (three) times daily as needed for cough.    Dispense:  30 capsule    Refill:  0    Follow up plan: Return in about 3 months (around 11/20/2017) for DM A1c.  Future labs ordered for 1 week printed for [facility] A1c, Vitamin D. Will follow-up results via [place]. Follow-up TBD either 3-6 months  Nobie Putnam, DO [facility] 08/20/2017, 8:35 PM

## 2017-11-28 ENCOUNTER — Telehealth: Payer: Self-pay | Admitting: Family Medicine

## 2017-11-28 DIAGNOSIS — I10 Essential (primary) hypertension: Secondary | ICD-10-CM

## 2017-11-28 MED ORDER — LISINOPRIL 20 MG PO TABS: 20.0000 mg | ORAL_TABLET | Freq: Every day | ORAL | 3 refills | Status: DC

## 2017-11-28 NOTE — Telephone Encounter (Signed)
Pt needs refill on [facility]isinopril [facility] to [facility] in [place].  His call back [phone]

## 2018-02-12 ENCOUNTER — Other Ambulatory Visit: Payer: Self-pay | Admitting: Family Medicine

## 2018-02-12 DIAGNOSIS — F419 Anxiety disorder, unspecified: Secondary | ICD-10-CM

## 2018-04-15 ENCOUNTER — Other Ambulatory Visit: Payer: Self-pay | Admitting: Family Medicine

## 2018-04-15 DIAGNOSIS — J209 Acute bronchitis, unspecified: Secondary | ICD-10-CM

## 2018-04-15 DIAGNOSIS — J302 Other seasonal allergic rhinitis: Secondary | ICD-10-CM

## 2018-05-03 ENCOUNTER — Other Ambulatory Visit: Payer: Self-pay | Admitting: Family Medicine

## 2018-05-03 DIAGNOSIS — J45909 Unspecified asthma, uncomplicated: Secondary | ICD-10-CM

## 2018-05-19 ENCOUNTER — Other Ambulatory Visit: Payer: Self-pay | Admitting: Family Medicine

## 2018-05-19 DIAGNOSIS — F419 Anxiety disorder, unspecified: Secondary | ICD-10-CM

## 2018-05-20 ENCOUNTER — Other Ambulatory Visit: Payer: Self-pay | Admitting: Family Medicine

## 2018-05-20 DIAGNOSIS — F419 Anxiety disorder, unspecified: Secondary | ICD-10-CM

## 2018-08-14 ENCOUNTER — Other Ambulatory Visit: Payer: Self-pay | Admitting: Family Medicine

## 2018-08-14 DIAGNOSIS — F419 Anxiety disorder, unspecified: Secondary | ICD-10-CM

## 2018-10-10 ENCOUNTER — Ambulatory Visit
Admission: EM | Admit: 2018-10-10 | Discharge: 2018-10-10 | Disposition: A | Discharge status: Home / Self Care | Payer: BC Managed Care – PPO | Attending: Family Medicine | Admitting: Family Medicine

## 2018-10-10 DIAGNOSIS — M10071 Idiopathic gout, right ankle and foot: Secondary | ICD-10-CM | POA: Insufficient documentation

## 2018-10-10 MED ORDER — PREDNISONE 20 MG PO TABS: ORAL_TABLET | ORAL | 0 refills | Status: DC

## 2018-10-10 MED ORDER — HYDROCODONE-ACETAMINOPHEN 5-325 MG PO TABS: ORAL_TABLET | ORAL | 0 refills | Status: DC

## 2018-10-10 NOTE — ED Triage Notes (Signed)
Pt here for pain in his right foot and has a history of gout. States it's painful to walk or bend his foot. Also pain around his right ankle as well. Did take ibuprofen for the pain

## 2018-10-10 NOTE — ED Provider Notes (Signed)
[facility]    CSN: [id number] Arrival date & time: 10/10/18  1406     History   Chief Complaint Chief Complaint  Patient presents with  . Gout    HPI Aaron Schwartz is a 38 y.o. male.   38 yo male with a c/o right ankle pain for the past 2 days. Denies any falls or other injuries. Denies any fevers, chills, rash. States he's had gout in the past and symptoms similar.   The history is provided by the patient.    Past Medical History:  Diagnosis Date  . Allergy   . Asthma   . Depression   . Hypertension     Patient Active Problem List   Diagnosis Date Noted  . Vitamin D deficiency 04/03/2017  . Allergic rhinitis 02/21/2017  . 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 Environmental and seasonal allergies 09/27/2016  . Controlled type 2 diabetes mellitus without complication ([place]) 35/70/1779  . Hyperlipidemia 09/27/2016  . Elevated serum creatinine 09/27/2016  . Eczema 08/23/2015  . Hypertension 04/18/2015  . Asthma 04/06/2015  . Anxiety 04/06/2015    Past Surgical History:  Procedure Laterality Date  . none         Home Medications    Prior to Admission medications   Medication Sig Start Date End Date Taking? Authorizing Provider  albuterol (PROVENTIL HFA;VENTOLIN HFA) 108 (90 Base) MCG/ACT inhaler INHALE 2 PUFFS INTO THE LUNGS EVERY 6 (SIX) HOURS AS NEEDED FOR WHEEZING OR SHORTNESS OF BREATH. 05/04/18  Yes Karamalegos, Devonne Doughty, DO  cetirizine (ZYRTEC) 10 MG tablet TAKE 1 TABLET BY MOUTH EVERY DAY 08/05/16  Yes Arlis Porta., MD  FLUoxetine (PROZAC) 40 MG capsule TAKE 1 CAPSULE BY MOUTH EVERY DAY 08/14/18  Yes Karamalegos, Devonne Doughty, DO  fluticasone (FLONASE) 50 MCG/ACT nasal spray SPRAY 2 SPRAYS INTO EACH NOSTRIL EVERY DAY 04/15/18  Yes Karamalegos, Devonne Doughty, DO  lisinopril (PRINIVIL,ZESTRIL) 20 MG tablet Take 1 tablet (20 mg total) by mouth daily. 11/28/17  Yes Mikey College, NP  metFORMIN (GLUCOPHAGE XR) 500 MG 24 hr tablet Take  1 tablet (500 mg total) by mouth daily with breakfast. 04/03/17  Yes Karamalegos, Devonne Doughty, DO  azithromycin (ZITHROMAX Z-PAK) 250 MG tablet Take 2 tabs (500mg  total) on Day 1. Take 1 tab (250mg ) daily for next 4 days. 08/20/17   Karamalegos, Devonne Doughty, DO  benzonatate (TESSALON) 100 MG capsule Take 1 capsule (100 mg total) by mouth 3 (three) times daily as needed for cough. 08/20/17   Karamalegos, Devonne Doughty, DO  Cholecalciferol (VITAMIN D3) 5000 units CAPS Take 1 capsule (5,000 Units total) by mouth daily. For 12 weeks, then start Vitamin D3 2,000 units daily (OTC) 04/03/17   Karamalegos, Devonne Doughty, DO  Fluocinolone Acetonide Scalp 0.01 % OIL Apply 1 application topically 2 (two) times daily. 04/03/17   Karamalegos, Devonne Doughty, DO  fluocinonide cream (LIDEX) 3.90 % Apply 1 application topically 2 (two) times daily. 08/23/15   Arlis Porta., MD  HYDROcodone-acetaminophen (NORCO/VICODIN) 5-325 MG tablet 1-2 tabs po bid prn 10/10/18   Norval Gable, MD  olopatadine (PATANOL) 0.1 % ophthalmic solution Place 1 drop into both eyes daily as needed for allergies. Patient not taking: Reported on 08/20/2017 09/27/16   Olin Hauser, DO  predniSONE (DELTASONE) 20 MG tablet 3 tabs po qd x 2 days, then 2 tabs po qd x 2 days, then 1 tab po qd x 2 days, then half a tab po qd x  2 days 10/10/18   Norval Gable, MD  triamcinolone ointment (KENALOG) 0.5 % Apply 1 application topically 2 (two) times daily. 04/03/17   Olin Hauser, DO    Family History Family History  Problem Relation Age of Onset  . Hypertension Mother     Social History Social History   Tobacco Use  . Smoking status: Never Smoker  . Smokeless tobacco: Never Used  Substance Use Topics  . Alcohol use: Yes    Comment: occasionally   . Drug use: No     Allergies   Gramineae pollens   Review of Systems Review of Systems   Physical Exam Triage Vital Signs ED Triage Vitals  Enc Vitals Group     BP  10/10/18 1429 (!) 148/93     Pulse Rate 10/10/18 1429 83     Resp 10/10/18 1429 18     Temp 10/10/18 1429 99.1 F (37.3 C)     Temp Source 10/10/18 1429 Oral     SpO2 10/10/18 1429 97 %     Weight 10/10/18 1431 (!) 320 lb (145.2 kg)     Height 10/10/18 1431 6\' 3"  (1.905 m)     Head Circumference --      Peak Flow --      Pain Score 10/10/18 1431 7     Pain Loc --      Pain Edu? --      Excl. in [place]? --    No data found.  Updated Vital Signs BP (!) 148/93 (BP Location: Left Arm)   Pulse 83   Temp 99.1 F (37.3 C) (Oral)   Resp 18   Ht 6\' 3"  (1.905 m)   Wt (!) 145.2 kg   SpO2 97%   BMI 40.00 kg/m   Visual Acuity Right Eye Distance:   Left Eye Distance:   Bilateral Distance:    Right Eye Near:   Left Eye Near:    Bilateral Near:     Physical Exam Vitals signs and nursing note reviewed.  Constitutional:      General: He is not in acute distress.    Appearance: Normal appearance. He is not toxic-appearing or diaphoretic.  Musculoskeletal:     Right ankle: He exhibits swelling (mild, diffuse). He exhibits no deformity, no laceration and normal pulse. Tenderness (diffuse). Achilles tendon normal.  Neurological:     Mental Status: He is alert.      UC Treatments / Results  Labs (all labs ordered are listed, but only abnormal results are displayed) Labs Reviewed - No data to display  EKG None  Radiology No results found.  Procedures Procedures (including critical care time)  Medications Ordered in UC Medications - No data to display  Initial Impression / Assessment and Plan / UC Course  I have reviewed the triage vital signs and the nursing notes.  Pertinent labs & imaging results that were available during my care of the patient were reviewed by me and considered in my medical decision making (see chart for details).      Final Clinical Impressions(s) / UC Diagnoses   Final diagnoses:  Acute idiopathic gout of right ankle    ED Prescriptions      Medication Sig Dispense Auth. Provider   predniSONE (DELTASONE) 20 MG tablet 3 tabs po qd x 2 days, then 2 tabs po qd x 2 days, then 1 tab po qd x 2 days, then half a tab po qd x 2 days 13 tablet Norval Gable, MD  HYDROcodone-acetaminophen (NORCO/VICODIN) 5-325 MG tablet 1-2 tabs po bid prn 6 tablet Norval Gable, MD     1. diagnosis reviewed with patient 2. rx as per orders above; reviewed possible side effects, interactions, risks and benefits  3. Recommend supportive treatment with rest, ice, elevation 4. Follow-up prn if symptoms worsen or don't improve     Controlled Substance Prescriptions [place] Controlled Substance Registry consulted? Not Applicable   Norval Gable, MD 10/10/18 1558

## 2018-11-11 ENCOUNTER — Telehealth: Payer: BC Managed Care – PPO | Admitting: Nurse Practitioner

## 2018-11-11 DIAGNOSIS — J0100 Acute maxillary sinusitis, unspecified: Secondary | ICD-10-CM | POA: Diagnosis not present

## 2018-11-11 MED ORDER — AMOXICILLIN-POT CLAVULANATE 875-125 MG PO TABS: 1.0000 | ORAL_TABLET | Freq: Two times a day (BID) | ORAL | 0 refills | Status: DC

## 2018-11-11 NOTE — Progress Notes (Signed)
We are sorry that you are not feeling well.  Here is how we plan to help!  Based on what you have shared with me it looks like you have sinusitis.  Sinusitis is inflammation and infection in the sinus cavities of the head.  Based on your presentation I believe you most likely have Acute Bacterial Sinusitis.  This is an infection caused by bacteria and is treated with antibiotics. I have prescribed Augmentin 875mg/125mg one tablet twice daily with food, for 7 days. You may use an oral decongestant such as Mucinex D or if you have glaucoma or high blood pressure use plain Mucinex. Saline nasal spray help and can safely be used as often as needed for congestion.  If you develop worsening sinus pain, fever or notice severe headache and vision changes, or if symptoms are not better after completion of antibiotic, please schedule an appointment with a health care provider.    Sinus infections are not as easily transmitted as other respiratory infection, however we still recommend that you avoid close contact with loved ones, especially the very young and elderly.  Remember to wash your hands thoroughly throughout the day as this is the number one way to prevent the spread of infection!  Home Care:  Only take medications as instructed by your medical team.  Complete the entire course of an antibiotic.  Do not take these medications with alcohol.  A steam or ultrasonic humidifier can help congestion.  You can place a towel over your head and breathe in the steam from hot water coming from a faucet.  Avoid close contacts especially the very young and the elderly.  Cover your mouth when you cough or sneeze.  Always remember to wash your hands.  Get Help Right Away If:  You develop worsening fever or sinus pain.  You develop a severe head ache or visual changes.  Your symptoms persist after you have completed your treatment plan.  Make sure you  Understand these instructions.  Will watch your  condition.  Will get help right away if you are not doing well or get worse.  Your e-visit answers were reviewed by a board certified advanced clinical practitioner to complete your personal care plan.  Depending on the condition, your plan could have included both over the counter or prescription medications.  If there is a problem please reply  once you have received a response from your provider.  Your safety is important to us.  If you have drug allergies check your prescription carefully.    You can use MyChart to ask questions about today's visit, request a non-urgent call back, or ask for a work or school excuse for 24 hours related to this e-Visit. If it has been greater than 24 hours you will need to follow up with your provider, or enter a new e-Visit to address those concerns.  You will get an e-mail in the next two days asking about your experience.  I hope that your e-visit has been valuable and will speed your recovery. Thank you for using e-visits.   5 minutes spent reviewing and documenting in chart.  

## 2018-12-07 ENCOUNTER — Other Ambulatory Visit: Payer: Self-pay | Admitting: Family Medicine

## 2018-12-07 DIAGNOSIS — J45909 Unspecified asthma, uncomplicated: Secondary | ICD-10-CM

## 2018-12-19 ENCOUNTER — Other Ambulatory Visit: Payer: Self-pay | Admitting: Nurse Practitioner

## 2018-12-19 DIAGNOSIS — I10 Essential (primary) hypertension: Secondary | ICD-10-CM

## 2018-12-21 ENCOUNTER — Other Ambulatory Visit: Payer: Self-pay | Admitting: Family Medicine

## 2018-12-21 NOTE — Telephone Encounter (Signed)
Pt  Called requesting refill on his  BP mediation [facility]  [place] [facility] Pt call back # is  [phone]

## 2018-12-22 NOTE — Telephone Encounter (Signed)
It's send.

## 2018-12-31 ENCOUNTER — Other Ambulatory Visit: Payer: Self-pay

## 2018-12-31 ENCOUNTER — Ambulatory Visit (INDEPENDENT_AMBULATORY_CARE_PROVIDER_SITE_OTHER): Payer: BC Managed Care – PPO | Admitting: Family Medicine

## 2018-12-31 ENCOUNTER — Encounter: Payer: Self-pay | Admitting: Family Medicine

## 2018-12-31 DIAGNOSIS — E119 Type 2 diabetes mellitus without complications: Secondary | ICD-10-CM | POA: Diagnosis not present

## 2018-12-31 DIAGNOSIS — M1009 Idiopathic gout, multiple sites: Secondary | ICD-10-CM | POA: Diagnosis not present

## 2018-12-31 MED ORDER — PREDNISONE 10 MG PO TABS: ORAL_TABLET | ORAL | 0 refills | Status: DC

## 2018-12-31 NOTE — Progress Notes (Signed)
Virtual Visit via Telephone The purpose of this virtual visit is to provide medical care while limiting exposure to the novel coronavirus (COVID19) for both patient and office staff.  Consent was obtained for phone visit:  Yes.   Answered questions that patient had about telehealth interaction:  Yes.   I discussed the limitations, risks, security and privacy concerns of performing an evaluation and management service by telephone. I also discussed with the patient that there may be a patient responsible charge related to this service. The patient expressed understanding and agreed to proceed.  Patient Location: Home Provider Location: [facility])  ---------------------------------------------------------------------- CC: Gout, Foot/Ankle pain  Chief Complaint  Patient presents with  . Gout    gout flare up in the right ankle and left foot.  gout flare up x 2 weeks. pt been trying to increase his water intake and apple cider vineger     S: Reviewed CMA telephone note below. I have called patient and gathered additional HPI as follows:  Reports that symptoms started 2 weeks ago - R foot front and great toe, and Left foot/ankle more posteriorly, seems typical for his prior gout flares, with some swelling and very sore to light touch around ankle. His symptoms are not improving now, he has tried to deal with the pain and manage it at home. He is aware of dietary triggers and tries his best to avoid these including red meat, beer and other. But he thinks he can still improve diet - He is taking Ibuprofen 200mg  x 2 pills few times with mild relief not resolving problem though - Last gout flare was 10/10/18 seen at [facility] - he was treated with Prednisone taper and hydrocodone for acute gout at that time, it resolved flare fairly quickly, he has a dose of hydrocodone left over, does not think he needs this one now - History of last labs 2018 - he has not  returned to care since, he has had elevated creatinine mildly and history of type 2 diabetes, previously controlled A1c, he has not had Uric acid blood level checked  Denies any high risk travel to areas of current concern for COVID19. Denies any known or suspected exposure to person with or possibly with COVID19.  Denies any fall or injury, bruising, fevers, chills, sweats, body ache, cough, shortness of breath, sinus pain or pressure, headache, abdominal pain, diarrhea  Past Medical History:  Diagnosis Date  . Allergy   . Asthma   . Depression   . Hypertension    Social History   Tobacco Use  . Smoking status: Never Smoker  . Smokeless tobacco: Never Used  Substance Use Topics  . Alcohol use: Yes    Comment: occasionally   . Drug use: No    Current Outpatient Medications:  .  albuterol (PROVENTIL HFA;VENTOLIN HFA) 108 (90 Base) MCG/ACT inhaler, TAKE 2 PUFFS BY MOUTH EVERY 6 HOURS AS NEEDED FOR WHEEZE OR SHORTNESS OF BREATH, Disp: 8.5 Inhaler, Rfl: 0 .  cetirizine (ZYRTEC) 10 MG tablet, TAKE 1 TABLET BY MOUTH EVERY DAY, Disp: 90 tablet, Rfl: 3 .  FLUoxetine (PROZAC) 40 MG capsule, TAKE 1 CAPSULE BY MOUTH EVERY DAY, Disp: 90 capsule, Rfl: 1 .  fluticasone (FLONASE) 50 MCG/ACT nasal spray, SPRAY 2 SPRAYS INTO EACH NOSTRIL EVERY DAY, Disp: 48 g, Rfl: 1 .  lisinopril (PRINIVIL,ZESTRIL) 20 MG tablet, TAKE 1 TABLET BY MOUTH EVERY DAY, Disp: 90 tablet, Rfl: 0 .  triamcinolone ointment (KENALOG) 0.5 %,  Apply 1 application topically 2 (two) times daily., Disp: 30 g, Rfl: 5 .  Cholecalciferol (VITAMIN D3) 5000 units CAPS, Take 1 capsule (5,000 Units total) by mouth daily. For 12 weeks, then start Vitamin D3 2,000 units daily (OTC) (Patient not taking: Reported on 12/31/2018), Disp: 60 capsule, Rfl: 0 .  Fluocinolone Acetonide Scalp 0.01 % OIL, Apply 1 application topically 2 (two) times daily. (Patient not taking: Reported on 12/31/2018), Disp: 1 Bottle, Rfl: 6 .  fluocinonide cream (LIDEX) 7.74  %, Apply 1 application topically 2 (two) times daily. (Patient not taking: Reported on 12/31/2018), Disp: 30 g, Rfl: 6 .  metFORMIN (GLUCOPHAGE XR) 500 MG 24 hr tablet, Take 1 tablet (500 mg total) by mouth daily with breakfast. (Patient not taking: Reported on 12/31/2018), Disp: 30 tablet, Rfl: 5 .  olopatadine (PATANOL) 0.1 % ophthalmic solution, Place 1 drop into both eyes daily as needed for allergies. (Patient not taking: Reported on 08/20/2017), Disp: 5 mL, Rfl: 2 .  predniSONE (DELTASONE) 10 MG tablet, Take 4 tablets (40mg ) for 2 days, then 3 tab (30mg ) for 2 days, then 2 tab (20mg ) for 2 days, then 1 tab (10mg ) for 2 days., Disp: 20 tablet, Rfl: 0  Depression screen [facility] 2/9 08/20/2017 03/25/2016 04/18/2015  Decreased Interest 1 0 0  Down, Depressed, Hopeless 1 0 0  PHQ - 2 Score 2 0 0  Altered sleeping 0 - -  Tired, decreased energy 2 - -  Change in appetite 0 - -  Feeling bad or failure about yourself  0 - -  Trouble concentrating 0 - -  Moving slowly or fidgety/restless 1 - -  Suicidal thoughts 0 - -  PHQ-9 Score 5 - -  Difficult doing work/chores Not difficult at all - -    GAD 7 : Generalized Anxiety Score 08/20/2017 04/03/2017 02/21/2017 08/20/2016  Nervous, Anxious, on Edge 1 1 1 1   Control/stop worrying 1 1 1  0  Worry too much - different things 1 1 2  0  Trouble relaxing 1 1 1 1   Restless 0 1 1 0  Easily annoyed or irritable 0 1 1 1   Afraid - awful might happen 0 0 0 0  Total GAD 7 Score 4 6 7 3   Anxiety Difficulty Not difficult at all Not difficult at all Not difficult at all Not difficult at all    -------------------------------------------------------------------------- O: No physical exam performed due to remote telephone encounter.  Lab results reviewed.    Chemistry      Component Value Date/Time   NA 136 03/31/2017 0857   NA 136 04/19/2015 0931   K 4.9 03/31/2017 0857   CL 102 03/31/2017 0857   CO2 27 03/31/2017 0857   BUN 14 03/31/2017 0857   BUN 19  04/19/2015 0931   CREATININE 1.24 03/31/2017 0857      Component Value Date/Time   CALCIUM 9.5 03/31/2017 0857   ALKPHOS 75 03/31/2017 0857   AST 15 03/31/2017 0857   ALT 16 03/31/2017 0857   BILITOT 0.4 03/31/2017 0857   BILITOT 0.5 04/19/2015 0931       No results found for this or any previous visit (from the past 2160 hour(s)).  -------------------------------------------------------------------------- A&P:  Problem List Items Addressed This Visit    Controlled type 2 diabetes mellitus without complication (HCC) Previous A1c >1.5 to 2 year ago, will be due for repeat A1c level  Plan - previously on Metformin XR 500mg  - now off med - Check A1c with upcoming labs  and revisit DM control updates    Relevant Orders   Hemoglobin A1c    Other Visit Diagnoses    Acute idiopathic gout of multiple sites    -  Primary   Relevant Medications   predniSONE (DELTASONE) 10 MG tablet   Other Relevant Orders   Uric acid   COMPLETE METABOLIC PANEL WITH GFR     Clinically consistent with acute gout flare of bilateral foot/ankle/toe both R and L  Duration x 1-2 weeks, seems typical compared to prior flares by patient report, limited by virtual visit cannot examine today 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 Could have reduced kidney function contributing Differential Dx - unlikely trauma without known inciting injury, unlikely OA, no sign of infection or systemic symptoms. No longer on uric acid lowering therapy  Plan: 1. Start Prednisone taper over 8 days, similar to prior pred dosing in 09/2018 - caution with diabetes, hyperglycemia 2. HOLD NSAID ibuprofen/aleve for now on prednisone - may restart AFTER if still symptoms of pain 3. IF NEW FLARE - Start OTC Aleve / Naproxen 500mg  BID x 7 to 10 days  4. Avoid excessive ambulation, relative rest, ice if helps, can take Tylenol PRN 5. Avoid food  triggers (red meat, alcohol) - improve diet 6. Follow-up  within 4-6weeks once acute flare resolved, labs uric acid - discuss uric acid lower therapy and prevention   Meds ordered this encounter  Medications  . predniSONE (DELTASONE) 10 MG tablet    Sig: Take 4 tablets (40mg ) for 2 days, then 3 tab (30mg ) for 2 days, then 2 tab (20mg ) for 2 days, then 1 tab (10mg ) for 2 days.    Dispense:  20 tablet    Refill:  0    Follow-up: - Return in 4-6 weeks for gout labs and follow-up, future labs ordered  Patient verbalizes understanding with the above medical recommendations including the limitation of remote medical advice.  Specific follow-up and call-back criteria were given for patient to follow-up or seek medical care more urgently if needed.   - Time spent in direct consultation with patient on phone: 14 minutes  Nobie Putnam, [facility] 12/31/2018, 9:36 AM

## 2018-12-31 NOTE — Patient Instructions (Addendum)
Your pain is most likely caused an Acute Gout Flare - Gout is a chronic problem that will have episodic flare ups with pain, redness, swelling of a joint, most common spots are big toe, foot and ankle, knee or sometimes hands or wrists. It is caused by small crystals made of Uric Acid that form in the joint causing pain and swelling.  Start Prednisone taper as prescribed. HOLD Ibuprofen, Advil, naproxen Aleve  NEXT TIME - For all gout flares, the sooner you start the medication, then the shorter the flare lasts. Go ahead and start taking Naproxen / Aleve OTC 220mg  x 2 pills per dose TWICE A DAY for up to 1 week - as soon as you get significant gout pain and swelling again in the future, and if it is not improving within 48 hours then you can follow-up at our office. OR if you don't start medication you can come to the office within 24-48 hours for treatment here.  Gout flares can repeat again soon after they resolve in the same spot or other joints, and may need repeat treatment.  Our goal is to prevent future gout flares. Try to avoid dietary triggers that are the most common causes of gout flares. - Avoid the following foods/drinks: - Red meat, organ meat (liver) - Alcohol (especially beer, also wine, liquor) - Processed foods / carbs (white bread, white rice, pasta, sugar) - Sugary drinks (sweet tea, soda) - Shellfish, shrimp / lobster  - Foods that are preferred to eat: - Beans, Lentils, Whole grains, Quinoa - Fruits, Vegetables - Dairy, Cheese, Yogurt - Soy based protein  We can do a blood test to check Uric Acid level, but the best way to confirm a diagnosis and help [place] determine the exact treatment you need is to drain the swelling and analyze a sample of the fluid for crystals. We do not do this procedure here at our office, and would ask that you notify us or come in for evaluation and we can get you urgently scheduled with Orthopedics or Rheumatology office.   DUE for FASTING  BLOOD WORK (no food or drink after midnight before the lab appointment, only water or coffee without cream/sugar on the morning of)  SCHEDULE "Lab Only" visit in the morning at the clinic for lab draw in 4-6 WEEKS   - Make sure Lab Only appointment is at about 1 week before your next appointment, so that results will be available  For Lab Results, once available within 2-3 days of blood draw, you can can log in to MyChart online to view your results and a brief explanation. Also, we can discuss results at next follow-up visit.   If you have any other questions or concerns, please feel free to call the office or send a message through [place]. You may also schedule an earlier appointment if necessary.  Additionally, you may be receiving a survey about your experience at our office within a few days to 1 week by e-mail or mail. We value your feedback.  Nobie Putnam, DO [place]

## 2019-01-01 ENCOUNTER — Other Ambulatory Visit: Payer: Self-pay | Admitting: Family Medicine

## 2019-01-01 DIAGNOSIS — J45909 Unspecified asthma, uncomplicated: Secondary | ICD-10-CM

## 2019-02-11 ENCOUNTER — Other Ambulatory Visit: Payer: BC Managed Care – PPO

## 2019-02-11 ENCOUNTER — Other Ambulatory Visit: Payer: Self-pay | Admitting: Family Medicine

## 2019-02-11 ENCOUNTER — Other Ambulatory Visit: Payer: Self-pay

## 2019-02-11 DIAGNOSIS — E119 Type 2 diabetes mellitus without complications: Secondary | ICD-10-CM

## 2019-02-11 DIAGNOSIS — R5383 Other fatigue: Secondary | ICD-10-CM

## 2019-02-11 DIAGNOSIS — M1009 Idiopathic gout, multiple sites: Secondary | ICD-10-CM

## 2019-02-12 LAB — COMPLETE METABOLIC PANEL WITH GFR
AG Ratio: 1.4 (calc) (ref 1.0–2.5)
ALT: 16 U/L (ref 9–46)
AST: 14 U/L (ref 10–40)
Albumin: 4.3 g/dL (ref 3.6–5.1)
Alkaline phosphatase (APISO): 74 U/L (ref 36–130)
BUN: 10 mg/dL (ref 7–25)
CO2: 29 mmol/L (ref 20–32)
Calcium: 9.3 mg/dL (ref 8.6–10.3)
Chloride: 103 mmol/L (ref 98–110)
Creat: 1.18 mg/dL (ref 0.60–1.35)
GFR, Est African American: 90 mL/min/{1.73_m2} (ref >=60)
GFR, Est Non African American: 77 mL/min/{1.73_m2} (ref >=60)
Globulin: 3 g/dL (calc) (ref 1.9–3.7)
Glucose, Bld: 146 mg/dL — ABNORMAL HIGH (ref 65–99)
Potassium: 4.3 mmol/L (ref 3.5–5.3)
Sodium: 140 mmol/L (ref 135–146)
Total Bilirubin: 0.3 mg/dL (ref 0.2–1.2)
Total Protein: 7.3 g/dL (ref 6.1–8.1)

## 2019-02-12 LAB — TESTOSTERONE TOTAL,FREE,BIO, MALES
Albumin: 4.4 g/dL (ref 3.6–5.1)
Sex Hormone Binding: 23 nmol/L (ref 10–50)
Testosterone: 204 ng/dL — ABNORMAL LOW (ref 250–827)

## 2019-02-12 LAB — HEMOGLOBIN A1C
Hgb A1c MFr Bld: 7.6 % of total Hgb — ABNORMAL HIGH (ref <5.7)
Mean Plasma Glucose: 171 (calc)
eAG (mmol/L): 9.5 (calc)

## 2019-02-12 LAB — URIC ACID: Uric Acid, Serum: 10 mg/dL — ABNORMAL HIGH (ref 4.0–8.0)

## 2019-02-15 ENCOUNTER — Other Ambulatory Visit: Payer: Self-pay | Admitting: Family Medicine

## 2019-02-15 DIAGNOSIS — F419 Anxiety disorder, unspecified: Secondary | ICD-10-CM

## 2019-02-17 ENCOUNTER — Ambulatory Visit (INDEPENDENT_AMBULATORY_CARE_PROVIDER_SITE_OTHER): Payer: BC Managed Care – PPO | Admitting: Family Medicine

## 2019-02-17 ENCOUNTER — Encounter: Payer: Self-pay | Admitting: Family Medicine

## 2019-02-17 ENCOUNTER — Other Ambulatory Visit: Payer: Self-pay

## 2019-02-17 DIAGNOSIS — E1169 Type 2 diabetes mellitus with other specified complication: Secondary | ICD-10-CM

## 2019-02-17 DIAGNOSIS — R7989 Other specified abnormal findings of blood chemistry: Secondary | ICD-10-CM

## 2019-02-17 DIAGNOSIS — M1A09X0 Idiopathic chronic gout, multiple sites, without tophus (tophi): Secondary | ICD-10-CM | POA: Diagnosis not present

## 2019-02-17 MED ORDER — ALLOPURINOL 100 MG PO TABS: 100.0000 mg | ORAL_TABLET | Freq: Every day | ORAL | 2 refills | Status: DC

## 2019-02-17 NOTE — Patient Instructions (Addendum)
Thank you for coming to the office today.  Refrral to urologist - for low testosterone  [facility] -[address]st floor [place],  [place]  [address] Phone: [phone]  ----------------------------------------  Call insurance find cost and coverage of the following  1. Ozempic (Semaglutide) 2. Trulicity (Dulaglutide) 3. Bydureon BCise (Exenatide ER)  Let me know which of these you prefer, they are all once weekly injection for diabetes. - Check genetic test or family history of Medullary Thyroid cancer or multiple endocrine neoplasia  Other medicines  4. Jardiance 5. Wilder Glade these are medicines that allow you to urinate extra sugar out and you can lower sugar that way, very good medications, reduce risk of heart disease and heart failure and weight loss, - but can increase risk of urinary tract infection  -------------------------------------------------------------------  START Allopurinol for gout,   Preventative NSAID Naproxen 250mg  BID - 6 weeks  If flare can double dose naproxen up to 500mg  BID for 7-10 days  If gout flare and If pain does not significantly improve within 48 to 72 hours, please notify us  Gout flares can repeat again soon after they resolve in the same spot or other joints, and may need repeat treatment.  Our goal is to prevent future gout flares. Try to avoid dietary triggers that are the most common causes of gout flares. - Avoid the following foods/drinks: - Red meat, organ meat (liver) - Alcohol (especially beer, also wine, liquor) - Processed foods / carbs (white bread, white rice, pasta, sugar) - Sugary drinks (sweet tea, soda) - Shellfish, shrimp / lobster  - Foods that are preferred to eat: - Beans, Lentils, Whole grains, Quinoa - Fruits, Vegetables - Dairy, Cheese, Yogurt - Soy based protein  DUE for FASTING BLOOD WORK (no food or drink after midnight before the lab appointment,  only water or coffee without cream/sugar on the morning of)  SCHEDULE "Lab Only" visit in the morning at the clinic for lab draw in 3 MONTHS   - Make sure Lab Only appointment is at about 1 week before your next appointment, so that results will be available  For Lab Results, once available within 2-3 days of blood draw, you can can log in to MyChart online to view your results and a brief explanation. Also, we can discuss results at next follow-up visit.    Please schedule a Follow-up Appointment to: Return in about 3 months (around 05/20/2019) for DM, Gout.  If you have any other questions or concerns, please feel free to call the office or send a message through [place]. You may also schedule an earlier appointment if necessary.  Additionally, you may be receiving a survey about your experience at our office within a few days to 1 week by e-mail or mail. We value your feedback.  Nobie Putnam, DO [place]

## 2019-02-17 NOTE — Assessment & Plan Note (Signed)
Elevated A1c now up to 7.6, from previous 6.8, with hyperglycemia Complications - hyperlipidemia inc risk of cardiovascular complication, obesity, gout OFF Metformin XR due to GI intolerance and concern w/ potential side effect  Plan:  1. Discussed med options today - recommended GLP1 vs SGLT2 medications - he will check into insurance cost and coverage, hopeful for GLP1 as a good fit for him, however he did report mother had thyroid cancer, he will check for his copy of genetic testing and test results, to determine if medullary thyroid cancer and if he wants to consider this med still, we have samples if he decides - otherwise we will consider SGLT2 as next option - he will check cost and cover and if agrees we can rx this option 2. Encourage improved lifestyle - low carb, low sugar diet, reduce portion size, continue improving regular exercise 3. Check CBG, bring log to next visit for review 4. Future due for DM foot and DM eye 5. Follow-up 3 months DM A1c - ultimately if he prefers can improve lifestyle without new med, for now, and re-check in 3 months, other med options less benefit but could help glycemic control would be sulfonylurea vs DPP4

## 2019-02-17 NOTE — Assessment & Plan Note (Signed)
Currently without acute gout flare Chronic recurrent gout flares, multiple joints, mostly foot/toe - 3-4 x in past few months Last flare 6 weeks ago, treated with prednisone Never on med prophylaxis - Last uric acid level 10.0 (01/2019)  Plan: 1. START Allopurinol 100mg  daily prophylaxis - Also add Prevenative dose NSAID while starting therapy - Naproxen / Aleve OTC 250mg  BID - if acute flare then double dose to 500mg  BID for 7-10 days, notify office 2. Avoid food triggers (red meat, alcohol) 3. Follow-up 3 months repeat uric acid

## 2019-02-17 NOTE — Assessment & Plan Note (Signed)
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

## 2019-02-17 NOTE — Progress Notes (Signed)
Virtual Visit via Telephone The purpose of this virtual visit is to provide medical care while limiting exposure to the novel coronavirus (COVID19) for both patient and office staff.  Consent was obtained for phone visit:  Yes.   Answered questions that patient had about telehealth interaction:  Yes.   I discussed the limitations, risks, security and privacy concerns of performing an evaluation and management service by telephone. I also discussed with the patient that there may be a patient responsible charge related to this service. The patient expressed understanding and agreed to proceed.  Patient Location: Home Provider Location: [facility])  ---------------------------------------------------------------------- Chief Complaint  Patient presents with  . Hypertension    S: Reviewed CMA documentation. I have called patient and gathered additional HPI as follows:  CHRONIC DM, Type 2 - Last visit with me for DM 07/2018 see prior notes for background information. - Interval updates - had prior A1c 6s, he tried Metformin XR 500 daily low dose and still had GI intolerance, self discontinued - Today now has elevated A1c up to 7.6, he says related to lifestyle as well, and off medicine. He was concerned also about potential side effects of metformin CBGs:None recently Meds:None - OFF Metformin XR 500mg  daily Currently on ACEi Lifestyle: - Diet (Drinking mostly water, but during football season drinks more gatorade. Tried to improve diet some) - Exercise (Regular activity with football coaching) Denies hypoglycemia, polyuria, visual changes, numbness or tingling.  Low Testosterone  Reports history of gradual worsening fatigue and tired, reduced energy. He has not had testosterone checked before, requested lab, results show mild low Total Testosterone at 204, should be >250. - He is interested in referral for 2nd opinion  Follow-up Gout, recurrent flare ups -  Last visit with me for acute gout flare in 12/2018 about 6 weeks ago, treated with prednisone that resolved that gout flare, see prior notes for background information. 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 Denies any known or suspected exposure to person with or possibly with COVID19.  Denies any fevers, chills, sweats, body ache, cough, shortness of breath, sinus pain or pressure, headache, abdominal pain, diarrhea  Past Medical History:  Diagnosis Date  . Allergy   . Asthma   . Depression   . Hypertension    Social History   Tobacco Use  . Smoking status: Never Smoker  . Smokeless tobacco: Never Used  Substance Use Topics  . Alcohol use: Yes    Comment: occasionally   . Drug use: No    Current Outpatient Medications:  .  albuterol (PROVENTIL HFA;VENTOLIN HFA) 108 (90 Base) MCG/ACT inhaler, TAKE 2 PUFFS BY MOUTH EVERY 6 HOURS AS NEEDED FOR WHEEZE OR SHORTNESS OF BREATH, Disp: 8.5 Inhaler, Rfl: 1 .  cetirizine (ZYRTEC) 10 MG tablet, TAKE 1 TABLET BY MOUTH EVERY DAY, Disp: 90 tablet, Rfl: 3 .  Fluocinolone Acetonide Scalp 0.01 % OIL, Apply 1 application topically 2 (two) times daily., Disp: 1 Bottle, Rfl: 6 .  fluocinonide cream (LIDEX) 6.78 %, Apply 1 application topically 2 (two) times daily., Disp: 30 g, Rfl: 6 .  FLUoxetine (  PROZAC) 40 MG capsule, TAKE 1 CAPSULE BY MOUTH EVERY DAY, Disp: 90 capsule, Rfl: 1 .  fluticasone (FLONASE) 50 MCG/ACT nasal spray, SPRAY 2 SPRAYS INTO EACH NOSTRIL EVERY DAY, Disp: 48 g, Rfl: 1 .  lisinopril (PRINIVIL,ZESTRIL) 20 MG tablet, TAKE 1 TABLET BY MOUTH EVERY DAY, Disp: 90  tablet, Rfl: 0 .  metFORMIN (GLUCOPHAGE XR) 500 MG 24 hr tablet, Take 1 tablet (500 mg total) by mouth daily with breakfast., Disp: 30 tablet, Rfl: 5 .  olopatadine (PATANOL) 0.1 % ophthalmic solution, Place 1 drop into both eyes daily as needed for allergies., Disp: 5 mL, Rfl: 2 .  triamcinolone ointment (KENALOG) 0.5 %, Apply 1 application topically 2 (two) times daily., Disp: 30 g, Rfl: 5 .  allopurinol (ZYLOPRIM) 100 MG tablet, Take 1 tablet (100 mg total) by mouth daily., Disp: 30 tablet, Rfl: 2  Depression screen [facility] 2/9 02/17/2019 08/20/2017 03/25/2016  Decreased Interest 0 1 0  Down, Depressed, Hopeless 0 1 0  PHQ - 2 Score 0 2 0  Altered sleeping - 0 -  Tired, decreased energy - 2 -  Change in appetite - 0 -  Feeling bad or failure about yourself  - 0 -  Trouble concentrating - 0 -  Moving slowly or fidgety/restless - 1 -  Suicidal thoughts - 0 -  PHQ-9 Score - 5 -  Difficult doing work/chores - Not difficult at all -    GAD 7 : Generalized Anxiety Score 02/17/2019 08/20/2017 04/03/2017 02/21/2017  Nervous, Anxious, on Edge 1 1 1 1   Control/stop worrying 1 1 1 1   Worry too much - different things 1 1 1 2   Trouble relaxing 1 1 1 1   Restless 1 0 1 1  Easily annoyed or irritable 1 0 1 1  Afraid - awful might happen 1 0 0 0  Total GAD 7 Score 7 4 6 7   Anxiety Difficulty Not difficult at all Not difficult at all Not difficult at all Not difficult at all   Family History  Problem Relation Age of Onset  . Hypertension Mother   . Thyroid cancer Mother      -------------------------------------------------------------------------- O: No physical exam performed due to remote telephone encounter.  Lab results reviewed.  Recent Labs    02/11/19 0806  HGBA1C 7.6*     Recent Results (from the past 2160 hour(s))  Hemoglobin A1c     Status: Abnormal   Collection Time: 02/11/19  8:06 AM  Result Value Ref Range   Hgb A1c MFr Bld 7.6 (H) <5.7 % of total Hgb    Comment: For  someone without known diabetes, a hemoglobin A1c value of 6.5% or greater indicates that they may have  diabetes and this should be confirmed with a follow-up  test. . For someone with known diabetes, a value <7% indicates  that their diabetes is well controlled and a value  greater than or equal to 7% indicates suboptimal  control. A1c targets should be individualized based on  duration of diabetes, age, comorbid conditions, and  other considerations. . Currently, no consensus exists regarding use of hemoglobin A1c for diagnosis of diabetes for children. .    Mean Plasma Glucose 171 (calc)   eAG (mmol/L) 9.5 (calc)  COMPLETE METABOLIC PANEL WITH GFR     Status: Abnormal   Collection Time: 02/11/19  8:06 AM  Result Value Ref Range   Glucose, Bld 146 (H) 65 - 99 mg/dL    Comment: .  Fasting reference interval . For someone without known diabetes, a glucose value >125 mg/dL indicates that they may have diabetes and this should be confirmed with a follow-up test. .    BUN 10 7 - 25 mg/dL   Creat 1.18 0.60 - 1.35 mg/dL   GFR, Est Non African American 77 > OR = 60 mL/min/1.25m2   GFR, Est African American 90 > OR = 60 mL/min/1.38m2   BUN/Creatinine Ratio NOT APPLICABLE 6 - 22 (calc)   Sodium 140 135 - 146 mmol/L   Potassium 4.3 3.5 - 5.3 mmol/L   Chloride 103 98 - 110 mmol/L   CO2 29 20 - 32 mmol/L   Calcium 9.3 8.6 - 10.3 mg/dL   Total Protein 7.3 6.1 - 8.1 g/dL   Albumin 4.3 3.6 - 5.1 g/dL   Globulin 3.0 1.9 - 3.7 g/dL (calc)   AG Ratio 1.4 1.0 - 2.5 (calc)   Total Bilirubin 0.3 0.2 - 1.2 mg/dL   Alkaline phosphatase (APISO) 74 36 - 130 U/L   AST 14 10 - 40 U/L   ALT 16 9 - 46 U/L  Uric acid     Status: Abnormal   Collection Time: 02/11/19  8:06 AM  Result Value Ref Range   Uric Acid, Serum 10.0 (H) 4.0 - 8.0 mg/dL    Comment: Therapeutic target for gout patients: <6.0 mg/dL .   Testosterone Total,Free,Bio, Males     Status: Abnormal   Collection Time:  02/11/19 10:10 AM  Result Value Ref Range   Testosterone 204 (L) 250 - 827 ng/dL   Albumin 4.4 3.6 - 5.1 g/dL   Sex Hormone Binding 23 10 - 50 nmol/L   Testosterone, Free See below 46.0 - 224.0 pg/mL   Testosterone, Bioavailable  110.0 - 575 ng/dL    Comment: Due to the diminished accuracy of immunoassay at levels below 250 ng/dL, calculations of the Free and Bioavailable Testosterone are not accurate. If needed, Testosterone, Free, Bio and Total, LC/MS/MS (test code [id number]) is the recommended assay. This specimen  must be collected in a red-top tube with no gel. .     -------------------------------------------------------------------------- A&P:  Problem List Items Addressed This Visit    Idiopathic chronic gout of multiple sites without tophus    Currently without acute gout flare Chronic recurrent gout flares, multiple joints, mostly foot/toe - 3-4 x in past few months Last flare 6 weeks ago, treated with prednisone Never on med prophylaxis - Last uric acid level 10.0 (01/2019)  Plan: 1. START Allopurinol 100mg  daily prophylaxis - Also add Prevenative dose NSAID while starting therapy - Naproxen / Aleve OTC 250mg  BID - if acute flare then double dose to 500mg  BID for 7-10 days, notify office 2. Avoid food triggers (red meat, alcohol) 3. Follow-up 3 months repeat uric acid      Relevant Medications   allopurinol (ZYLOPRIM) 100 MG tablet   Other Relevant Orders   BASIC METABOLIC PANEL WITH GFR   Uric acid   Low 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    Mild low testosterone 204, on last lab, no comparison lab result Clinically consistent with his symptoms  Recommend referral to [facility] for further diagnostic eval and determine management options, offered repeat lab but agree to consult Urologist      Relevant Orders   Ambulatory referral to Urology   Type 2 diabetes mellitus with other specified complication ([place]) - Primary    Elevated A1c now up to 7.6, from previous 6.8, with  hyperglycemia Complications -  hyperlipidemia inc risk of cardiovascular complication, obesity, gout OFF Metformin XR due to GI intolerance and concern w/ potential side effect  Plan:  1. Discussed med options today - recommended GLP1 vs SGLT2 medications - he will check into insurance cost and coverage, hopeful for GLP1 as a good fit for him, however he did report mother had thyroid cancer, he will check for his copy of genetic testing and test results, to determine if medullary thyroid cancer and if he wants to consider this med still, we have samples if he decides - otherwise we will consider SGLT2 as next option - he will check cost and cover and if agrees we can rx this option 2. Encourage improved lifestyle - low carb, low sugar diet, reduce portion size, continue improving regular exercise 3. Check CBG, bring log to next visit for review 4. Future due for DM foot and DM eye 5. Follow-up 3 months DM A1c - ultimately if he prefers can improve lifestyle without new med, for now, and re-check in 3 months, other med options less benefit but could help glycemic control would be sulfonylurea vs DPP4       Relevant Orders   Hemoglobin I4P   BASIC METABOLIC PANEL WITH GFR      Meds ordered this encounter  Medications  . allopurinol (ZYLOPRIM) 100 MG tablet    Sig: Take 1 tablet (100 mg total) by mouth daily.    Dispense:  30 tablet    Refill:  2   Orders Placed This Encounter  Procedures  . Hemoglobin A1c    Standing Status:   Future    Standing Expiration Date:   08/01/2019  . BASIC METABOLIC PANEL WITH GFR    Standing Status:   Future    Standing Expiration Date:   08/01/2019  . Uric acid    Standing Status:   Future    Standing Expiration Date:   08/01/2019  . Ambulatory referral to Urology    Referral Priority:   Routine    Referral Type:   Consultation    Referral Reason:   Specialty Services Required    Requested Specialty:   Urology    Number of Visits Requested:   1      Follow-up: - Return in 3 months for DM, Gout - Future labs ordered for 05/2019 approx - BMET, A1c, Uric Acid  Patient verbalizes understanding with the above medical recommendations including the limitation of remote medical advice.  Specific follow-up and call-back criteria were given for patient to follow-up or seek medical care more urgently if needed.   - Time spent in direct consultation with patient on phone: 25 minutes  Nobie Putnam, [facility] 02/17/2019, 9:07 AM

## 2019-03-16 ENCOUNTER — Other Ambulatory Visit: Payer: Self-pay | Admitting: Family Medicine

## 2019-03-16 DIAGNOSIS — I10 Essential (primary) hypertension: Secondary | ICD-10-CM

## 2019-04-15 ENCOUNTER — Other Ambulatory Visit: Payer: Self-pay

## 2019-04-15 ENCOUNTER — Encounter: Payer: Self-pay | Admitting: Urology

## 2019-04-15 ENCOUNTER — Ambulatory Visit: Payer: BC Managed Care – PPO | Admitting: Urology

## 2019-04-15 VITALS — BP 166/95 | HR 91 | Ht 75.0 in | Wt 319.6 lb

## 2019-04-15 DIAGNOSIS — E291 Testicular hypofunction: Secondary | ICD-10-CM

## 2019-04-15 NOTE — Progress Notes (Signed)
04/15/2019 2:40 PM   Aaron Schwartz March 30, 1981 [id number]  Referring provider: Olin Hauser, DO [address] [place],  [place] [address]  Chief Complaint  Patient presents with  . Hypogonadism    HPI: Aaron Schwartz is a 38 year old male seen in consultation at the request of Dr. Parks Ranger for hypogonadism.  Around 2012 he slowly noted gradual worsening tiredness, fatigue and weight gain. An a.m. testosterone level on 02/11/2019 was low at 204 ng/dL.  He presently complains of tiredness, fatigue, low energy, less enjoyment of life and decreased libido.  He denies erectile dysfunction.  He denies history of sleep apnea.  He has no bothersome lower urinary tract symptoms.  PMH: Past Medical History:  Diagnosis Date  . Allergy   . Asthma   . Depression   . Hypertension     Surgical History: Past Surgical History:  Procedure Laterality Date  . none      Home Medications:  Allergies as of 04/15/2019      Reactions   Gramineae Pollens       Medication List       Accurate as of April 15, 2019  2:40 PM. If you have any questions, ask your nurse or doctor.        albuterol 108 (90 Base) MCG/ACT inhaler Commonly known as: VENTOLIN HFA TAKE 2 PUFFS BY MOUTH EVERY 6 HOURS AS NEEDED FOR WHEEZE OR SHORTNESS OF BREATH   allopurinol 100 MG tablet Commonly known as: ZYLOPRIM Take 1 tablet (100 mg total) by mouth daily.   cetirizine 10 MG tablet Commonly known as: ZYRTEC TAKE 1 TABLET BY MOUTH EVERY DAY   Fluocinolone Acetonide Scalp 0.01 % Oil Apply 1 application topically 2 (two) times daily.   fluocinonide cream 0.05 % Commonly known as: LIDEX Apply 1 application topically 2 (two) times daily.   FLUoxetine 40 MG capsule Commonly known as: PROZAC TAKE 1 CAPSULE BY MOUTH EVERY DAY   fluticasone 50 MCG/ACT nasal spray Commonly known as: FLONASE SPRAY 2 SPRAYS INTO EACH NOSTRIL EVERY DAY   lisinopril 20 MG tablet Commonly known as: ZESTRIL TAKE 1 TABLET  BY MOUTH EVERY DAY   metFORMIN 500 MG 24 hr tablet Commonly known as: Glucophage XR Take 1 tablet (500 mg total) by mouth daily with breakfast.   olopatadine 0.1 % ophthalmic solution Commonly known as: Patanol Place 1 drop into both eyes daily as needed for allergies.   Otezla 30 MG Tabs Generic drug: Apremilast   triamcinolone ointment 0.5 % Commonly known as: KENALOG Apply 1 application topically 2 (two) times daily.       Allergies:  Allergies  Allergen Reactions  . Gramineae Pollens     Family History: Family History  Problem Relation Age of Onset  . Hypertension Mother   . Thyroid cancer Mother     Social History:  reports that he has never smoked. He has never used smokeless tobacco. He reports current alcohol use. He reports that he does not use drugs.  ROS: UROLOGY Frequent Urination?: No Hard to postpone urination?: No Burning/pain with urination?: No Get up at night to urinate?: No Leakage of urine?: No Urine stream starts and stops?: No Trouble starting stream?: No Do you have to strain to urinate?: No Blood in urine?: No Urinary tract infection?: No Sexually transmitted disease?: No Injury to kidneys or bladder?: No Painful intercourse?: No Weak stream?: No Erection problems?: No Penile pain?: No  Gastrointestinal Nausea?: No Vomiting?: No Indigestion/heartburn?: No Diarrhea?: No Constipation?: No  Constitutional Fever: No Night sweats?: No Weight loss?: No Fatigue?: No  Skin Skin rash/lesions?: No Itching?: No  Eyes Blurred vision?: No Double vision?: No  Ears/Nose/Throat Sore throat?: No Sinus problems?: No  Hematologic/Lymphatic Swollen glands?: No Easy bruising?: No  Cardiovascular Leg swelling?: No Chest pain?: No  Respiratory Cough?: No Shortness of breath?: No  Endocrine Excessive thirst?: No  Musculoskeletal Back pain?: No Joint pain?: No  Neurological Headaches?: No Dizziness?: No  Psychologic  Depression?: No Anxiety?: No  Physical Exam: BP (!) 166/95 (BP Location: Left Arm, Patient Position: Sitting, Cuff Size: Large)   Pulse 91   Ht 6\' 3"  (1.905 m)   Wt (!) 319 lb 9.6 oz (145 kg)   BMI 39.95 kg/m   Constitutional:  Alert and oriented, No acute distress. HEENT: [place] AT, moist mucus membranes.  Trachea midline, no masses. Cardiovascular: No clubbing, cyanosis, or edema. Respiratory: Normal respiratory effort, no increased work of breathing. GI: Abdomen is soft, nontender, nondistended, no abdominal masses GU: No CVA tenderness.  Penis without lesions, testes descended bilaterally with estimated size 20 cc.  Cord/epididymis normal bilaterally. Skin: No rashes, bruises or suspicious lesions. Neurologic: Grossly intact, no focal deficits, moving all 4 extremities. Psychiatric: Normal mood and affect.  Laboratory Data:  Lab Results  Component Value Date   TESTOSTERONE 204 (L) 02/11/2019    Assessment & Plan:   38 year old male with symptomatic hypogonadism.  For insurance coverage will need to check a repeat a.m. testosterone level and would also add an LH, prolactin and PSA.  He states he is due to have blood drawn next week at his PCP and will see if they can add.  We discussed various forms of TRT including topicals, intramuscular injections, Xyosted, Jatenzo and off label use of Clomid if his LH is low normal.  Potential side effects of testosterone replacement were discussed including stimulation of benign prostatic growth with lower urinary tract symptoms; erythrocytosis; edema; gynecomastia; worsening sleep apnea; venous thromboembolism; testicular atrophy and infertility. Recent studies suggesting an increased incidence of heart attack and stroke in patients taking testosterone was discussed. He was informed there is conflicting evidence regarding the impact of testosterone therapy on cardiovascular risk. The theoretical risk of growth stimulation of an undetected  prostate cancer was also discussed.  He was informed that current evidence does not provide any definitive answers regarding the risks of testosterone therapy on prostate cancer and cardiovascular disease. The need for periodic monitoring of his testosterone level, PSA, hematocrit and DRE was discussed.  He is primary interested in [place] and will await his repeat blood work prior to prescribing.  Abbie Sons, [place] [address], [place], [place] [address] [phone]

## 2019-04-19 ENCOUNTER — Other Ambulatory Visit: Payer: Self-pay | Admitting: Family Medicine

## 2019-04-19 DIAGNOSIS — J209 Acute bronchitis, unspecified: Secondary | ICD-10-CM

## 2019-04-19 DIAGNOSIS — J302 Other seasonal allergic rhinitis: Secondary | ICD-10-CM

## 2019-04-20 ENCOUNTER — Other Ambulatory Visit: Payer: Self-pay | Admitting: Family Medicine

## 2019-04-20 ENCOUNTER — Encounter: Payer: Self-pay | Admitting: Urology

## 2019-04-20 DIAGNOSIS — R7989 Other specified abnormal findings of blood chemistry: Secondary | ICD-10-CM

## 2019-04-20 DIAGNOSIS — Z125 Encounter for screening for malignant neoplasm of prostate: Secondary | ICD-10-CM

## 2019-04-20 DIAGNOSIS — M1A09X0 Idiopathic chronic gout, multiple sites, without tophus (tophi): Secondary | ICD-10-CM

## 2019-04-20 DIAGNOSIS — E1169 Type 2 diabetes mellitus with other specified complication: Secondary | ICD-10-CM

## 2019-04-26 ENCOUNTER — Other Ambulatory Visit: Payer: BC Managed Care – PPO

## 2019-04-26 DIAGNOSIS — R7989 Other specified abnormal findings of blood chemistry: Secondary | ICD-10-CM

## 2019-04-26 DIAGNOSIS — M1A09X0 Idiopathic chronic gout, multiple sites, without tophus (tophi): Secondary | ICD-10-CM

## 2019-04-26 DIAGNOSIS — E1169 Type 2 diabetes mellitus with other specified complication: Secondary | ICD-10-CM

## 2019-04-26 DIAGNOSIS — Z125 Encounter for screening for malignant neoplasm of prostate: Secondary | ICD-10-CM

## 2019-04-27 ENCOUNTER — Telehealth: Payer: Self-pay

## 2019-04-27 LAB — TESTOSTERONE: Testosterone: 217 ng/dL — ABNORMAL LOW (ref 250–827)

## 2019-04-27 LAB — BASIC METABOLIC PANEL WITH GFR
BUN: 10 mg/dL (ref 7–25)
CO2: 23 mmol/L (ref 20–32)
Calcium: 9.3 mg/dL (ref 8.6–10.3)
Chloride: 101 mmol/L (ref 98–110)
Creat: 1.12 mg/dL (ref 0.60–1.35)
GFR, Est African American: 95 mL/min/{1.73_m2} (ref >=60)
GFR, Est Non African American: 82 mL/min/{1.73_m2} (ref >=60)
Glucose, Bld: 163 mg/dL — ABNORMAL HIGH (ref 65–99)
Potassium: 4.4 mmol/L (ref 3.5–5.3)
Sodium: 139 mmol/L (ref 135–146)

## 2019-04-27 LAB — PROLACTIN: Prolactin: 8.8 ng/mL (ref 2.0–18.0)

## 2019-04-27 LAB — LUTEINIZING HORMONE: LH: 2.6 m[IU]/mL (ref 1.5–9.3)

## 2019-04-27 LAB — HEMOGLOBIN A1C
Hgb A1c MFr Bld: 8.4 % of total Hgb — ABNORMAL HIGH (ref <5.7)
Mean Plasma Glucose: 194 (calc)
eAG (mmol/L): 10.8 (calc)

## 2019-04-27 LAB — PSA: PSA: 0.3 ng/mL (ref <=4.0)

## 2019-04-27 LAB — URIC ACID: Uric Acid, Serum: 7.7 mg/dL (ref 4.0–8.0)

## 2019-04-27 NOTE — Telephone Encounter (Signed)
.  mychart

## 2019-04-27 NOTE — Telephone Encounter (Signed)
-----   Message from Abbie Sons, MD sent at 04/27/2019  9:19 AM EDT ----- PSA looks good at 0.3.  Testosterone level low at 217.  He was interested in [place].  Since his LH is low normal he would also be a candidate for oral Clomid if he wanted to try.  Please let me know which he would like to try.

## 2019-04-29 ENCOUNTER — Other Ambulatory Visit: Payer: Self-pay

## 2019-04-29 ENCOUNTER — Ambulatory Visit (INDEPENDENT_AMBULATORY_CARE_PROVIDER_SITE_OTHER): Payer: BC Managed Care – PPO | Admitting: Family Medicine

## 2019-04-29 ENCOUNTER — Encounter: Payer: Self-pay | Admitting: Family Medicine

## 2019-04-29 DIAGNOSIS — E1169 Type 2 diabetes mellitus with other specified complication: Secondary | ICD-10-CM

## 2019-04-29 DIAGNOSIS — M1A09X0 Idiopathic chronic gout, multiple sites, without tophus (tophi): Secondary | ICD-10-CM | POA: Diagnosis not present

## 2019-04-29 DIAGNOSIS — J45909 Unspecified asthma, uncomplicated: Secondary | ICD-10-CM | POA: Diagnosis not present

## 2019-04-29 MED ORDER — FARXIGA 5 MG PO TABS: 5.0000 mg | ORAL_TABLET | Freq: Every day | ORAL | 2 refills | Status: DC

## 2019-04-29 MED ORDER — ALBUTEROL SULFATE HFA 108 (90 BASE) MCG/ACT IN AERS: INHALATION_SPRAY | RESPIRATORY_TRACT | 3 refills | Status: DC

## 2019-04-29 MED ORDER — ALLOPURINOL 100 MG PO TABS: 200.0000 mg | ORAL_TABLET | Freq: Every day | ORAL | 2 refills | Status: DC

## 2019-04-29 NOTE — Patient Instructions (Addendum)
Recent Labs    02/11/19 0806 04/26/19 0828  HGBA1C 7.6* 8.4*   Try your best to keep improving lifestyle for sugar.  We will try a newer medicine that can be very effective.  Farxiga 5mg  - once daily in morning, it reduces sugar by allowing your body to get rid of more in the urine.  As discussed, this can lower sugar, lower blood pressure, reduce some weight, and protect your heart reduce risk of heart failure, it is used for prevention as well.  Caution if you get urinary tract infection or yeast infection with too much sugar in the urine, let us know.  Also rarely can cause tingling or nerve pain in legs, notify us if any alarming or new symptoms.  Coupon discount card available at front office. It may need to be updated, it looks like our cards were over a year old. The same card is online as well - [employer] and it can direct you to a "Patient Savings Copay Card or Program" you just need to submit your info and you get the same card w/ authorization number  DUE for FASTING BLOOD WORK (no food or drink after midnight before the lab appointment, only water or coffee without cream/sugar on the morning of)  SCHEDULE "Lab Only" visit in the morning at the clinic for lab draw in 3 MONTHS   - Make sure Lab Only appointment is at about 1 week before your next appointment, so that results will be available  For Lab Results, once available within 2-3 days of blood draw, you can can log in to MyChart online to view your results and a brief explanation. Also, we can discuss results at next follow-up visit.   Please schedule a Follow-up Appointment to: Return in about 3 months (around 07/30/2019) for DM Gout labs.  If you have any other questions or concerns, please feel free to call the office or send a message through [place]. You may also schedule an earlier appointment if necessary.  Additionally, you may be receiving a survey about your experience at our office within a few days to  1 week by e-mail or mail. We value your feedback.  Nobie Putnam, DO [place]

## 2019-04-29 NOTE — Progress Notes (Signed)
Virtual Visit via Telephone The purpose of this virtual visit is to provide medical care while limiting exposure to the novel coronavirus (COVID19) for both patient and office staff.  Consent was obtained for phone visit:  Yes.   Answered questions that patient had about telehealth interaction:  Yes.   I discussed the limitations, risks, security and privacy concerns of performing an evaluation and management service by telephone. I also discussed with the patient that there may be a patient responsible charge related to this service. The patient expressed understanding and agreed to proceed.  Patient Location: Home Provider Location: [facility] [place])  ---------------------------------------------------------------------- Chief Complaint  Patient presents with  . Diabetes    not taking metformin since it is upset his stomach and doing herbal suppliments   . Gout    S: Reviewed CMA documentation. I have called patient and gathered additional HPI as follows:  CHRONIC DM, Type 2 - Last visit with mefor DM 01/2019 w. Elevated A1c, considered additional medications, see prior notes for background information. - Interval updates - remained off metformin due to GI intolerance, self discontinued - Today now has elevated A1c up to 8.4. Still says related to lifestyle as well, and off medicine.  CBGs:None recently Meds:None - OFF Metformin XR 500mg  daily - He considered GLP1 but he has family history of thyroid cancer, he will drop off form with info on this cancer. Currently on ACEi Lifestyle: - Diet (Drinking mostly water) - Exercise (Less now, normally regular activity with football coaching) Denies hypoglycemia, polyuria, visual changes, numbness or tingling  Follow-up Gout - Last visit with me 01/2019, for started allopurinol for prevention, dose 100mg , see prior notes for background information. - Interval update with doing well on prevention med only x 1 gout  flare recently 1.5 week ago moderate to severe but self limited, improved w/ Aleve, and hydration and rest - Last lab now showed Uric Acid down from 10 to 7.7 - Today patient reports interested to prevent further flares  Additional updates Low Testosterone - Followed by [facility] Dr Bernardo Heater, recent labs reviewed, they will plan to initiate testosterone therapy  Denies any fevers, chills, sweats, body ache, cough, shortness of breath, sinus pain or pressure, headache, abdominal pain, diarrhea  Past Medical History:  Diagnosis Date  . Allergy   . Asthma   . Depression   . Hypertension    Social History   Tobacco Use  . Smoking status: Never Smoker  . Smokeless tobacco: Never Used  Substance Use Topics  . Alcohol use: Yes    Comment: occasionally   . Drug use: No    Current Outpatient Medications:  .  albuterol (VENTOLIN HFA) 108 (90 Base) MCG/ACT inhaler, TAKE 2 PUFFS BY MOUTH EVERY 6 HOURS AS NEEDED FOR WHEEZE OR SHORTNESS OF BREATH, Disp: 8.5 g, Rfl: 3 .  allopurinol (ZYLOPRIM) 100 MG tablet, Take 2 tablets (200 mg total) by mouth daily., Disp: 60 tablet, Rfl: 2 .  cetirizine (ZYRTEC) 10 MG tablet, TAKE 1 TABLET BY MOUTH EVERY DAY, Disp: 90 tablet, Rfl: 3 .  Fluocinolone Acetonide Scalp 0.01 % OIL, Apply 1 application topically 2 (two) times daily., Disp: 1 Bottle, Rfl: 6 .  fluocinonide cream (LIDEX) 6.57 %, Apply 1 application topically 2 (two) times daily., Disp: 30 g, Rfl: 6 .  FLUoxetine (PROZAC) 40 MG capsule, TAKE 1 CAPSULE BY MOUTH EVERY DAY, Disp: 90 capsule, Rfl: 1 .  fluticasone (FLONASE) 50 MCG/ACT nasal spray, SPRAY 2 SPRAYS INTO [facility]  NOSTRIL EVERY DAY, Disp: 48 mL, Rfl: 1 .  lisinopril (ZESTRIL) 20 MG tablet, TAKE 1 TABLET BY MOUTH EVERY DAY, Disp: 90 tablet, Rfl: 1 .  olopatadine (PATANOL) 0.1 % ophthalmic solution, Place 1 drop into both eyes daily as needed for allergies., Disp: 5 mL, Rfl: 2 .  OTEZLA 30 MG TABS, , Disp: , Rfl:  .  triamcinolone ointment (KENALOG) 0.5  %, Apply 1 application topically 2 (two) times daily., Disp: 30 g, Rfl: 5 .  FARXIGA 5 MG TABS tablet, Take 5 mg by mouth daily., Disp: 30 tablet, Rfl: 2 .  metFORMIN (GLUCOPHAGE XR) 500 MG 24 hr tablet, Take 1 tablet (500 mg total) by mouth daily with breakfast. (Patient not taking: Reported on 04/29/2019), Disp: 30 tablet, Rfl: 5  Depression screen [facility] 2/9 04/29/2019 02/17/2019 08/20/2017  Decreased Interest 1 0 1  Down, Depressed, Hopeless 1 0 1  PHQ - 2 Score 2 0 2  Altered sleeping 1 - 0  Tired, decreased energy 0 - 2  Change in appetite 1 - 0  Feeling bad or failure about yourself  0 - 0  Trouble concentrating 0 - 0  Moving slowly or fidgety/restless 0 - 1  Suicidal thoughts 0 - 0  PHQ-9 Score 4 - 5  Difficult doing work/chores Not difficult at all - Not difficult at all    GAD 7 : Generalized Anxiety Score 04/29/2019 02/17/2019 08/20/2017 04/03/2017  Nervous, Anxious, on Edge 2 1 1 1   Control/stop worrying 2 1 1 1   Worry too much - different things 2 1 1 1   Trouble relaxing 2 1 1 1   Restless 1 1 0 1  Easily annoyed or irritable 1 1 0 1  Afraid - awful might happen 2 1 0 0  Total GAD 7 Score 12 7 4 6   Anxiety Difficulty Somewhat difficult Not difficult at all Not difficult at all Not difficult at all    -------------------------------------------------------------------------- O: No physical exam performed due to remote telephone encounter.  Lab results reviewed.  Recent Labs    02/11/19 0806 04/26/19 0828  HGBA1C 7.6* 8.4*     Recent Results (from the past 2160 hour(s))  Hemoglobin A1c     Status: Abnormal   Collection Time: 02/11/19  8:06 AM  Result Value Ref Range   Hgb A1c MFr Bld 7.6 (H) <5.7 % of total Hgb    Comment: For someone without known diabetes, a hemoglobin A1c value of 6.5% or greater indicates that they may have  diabetes and this should be confirmed with a follow-up  test. . For someone with known diabetes, a value <7% indicates  that their  diabetes is well controlled and a value  greater than or equal to 7% indicates suboptimal  control. A1c targets should be individualized based on  duration of diabetes, age, comorbid conditions, and  other considerations. . Currently, no consensus exists regarding use of hemoglobin A1c for diagnosis of diabetes for children. .    Mean Plasma Glucose 171 (calc)   eAG (mmol/L) 9.5 (calc)  COMPLETE METABOLIC PANEL WITH GFR     Status: Abnormal   Collection Time: 02/11/19  8:06 AM  Result Value Ref Range   Glucose, Bld 146 (H) 65 - 99 mg/dL    Comment: .            Fasting reference interval . For someone without known diabetes, a glucose value >125 mg/dL indicates that they may have diabetes and this should be confirmed with  a follow-up test. .    BUN 10 7 - 25 mg/dL   Creat 1.18 0.60 - 1.35 mg/dL   GFR, Est Non African American 77 > OR = 60 mL/min/1.65m2   GFR, Est African American 90 > OR = 60 mL/min/1.68m2   BUN/Creatinine Ratio NOT APPLICABLE 6 - 22 (calc)   Sodium 140 135 - 146 mmol/L   Potassium 4.3 3.5 - 5.3 mmol/L   Chloride 103 98 - 110 mmol/L   CO2 29 20 - 32 mmol/L   Calcium 9.3 8.6 - 10.3 mg/dL   Total Protein 7.3 6.1 - 8.1 g/dL   Albumin 4.3 3.6 - 5.1 g/dL   Globulin 3.0 1.9 - 3.7 g/dL (calc)   AG Ratio 1.4 1.0 - 2.5 (calc)   Total Bilirubin 0.3 0.2 - 1.2 mg/dL   Alkaline phosphatase (APISO) 74 36 - 130 U/L   AST 14 10 - 40 U/L   ALT 16 9 - 46 U/L  Uric acid     Status: Abnormal   Collection Time: 02/11/19  8:06 AM  Result Value Ref Range   Uric Acid, Serum 10.0 (H) 4.0 - 8.0 mg/dL    Comment: Therapeutic target for gout patients: <6.0 mg/dL .   Testosterone Total,Free,Bio, Males     Status: Abnormal   Collection Time: 02/11/19 10:10 AM  Result Value Ref Range   Testosterone 204 (L) 250 - 827 ng/dL   Albumin 4.4 3.6 - 5.1 g/dL   Sex Hormone Binding 23 10 - 50 nmol/L   Testosterone, Free See below 46.0 - 224.0 pg/mL   Testosterone, Bioavailable  110.0  - 575 ng/dL    Comment: Due to the diminished accuracy of immunoassay at levels below 250 ng/dL, calculations of the Free and Bioavailable Testosterone are not accurate. If needed, Testosterone, Free, Bio and Total, LC/MS/MS (test code [phone]) is the recommended assay. This specimen  must be collected in a red-top tube with no gel. Marland Kitchen   PSA     Status: None   Collection Time: 04/26/19  8:28 AM  Result Value Ref Range   PSA 0.3 < OR = 4.0 ng/mL    Comment: The total PSA value from this assay system is  standardized against the WHO standard. The test  result will be approximately 20% lower when compared  to the equimolar-standardized total PSA (Beckman  Coulter). Comparison of serial PSA results should be  interpreted with this fact in mind. . This test was performed using the Siemens  chemiluminescent method. Values obtained from  different assay methods cannot be used interchangeably. PSA levels, regardless of value, should not be interpreted as absolute evidence of the presence or absence of disease.   Luteinizing hormone     Status: None   Collection Time: 04/26/19  8:28 AM  Result Value Ref Range   LH 2.6 1.5 - 9.3 mIU/mL  Prolactin     Status: None   Collection Time: 04/26/19  8:28 AM  Result Value Ref Range   Prolactin 8.8 2.0 - 18.0 ng/mL  Testosterone     Status: Abnormal   Collection Time: 04/26/19  8:28 AM  Result Value Ref Range   Testosterone 217 (L) 250 - 827 ng/dL    Comment: In hypogonadal males, Testosterone, Total, LC/MS/MS, is the recommended assay due to the diminished accuracy of immunoassay at levels below 250 ng/dL. This test code [phone]) must be collected in a red-top tube with no gel.    Uric acid  Status: None   Collection Time: 04/26/19  8:28 AM  Result Value Ref Range   Uric Acid, Serum 7.7 4.0 - 8.0 mg/dL    Comment: Therapeutic target for gout patients: <6.0 mg/dL .   BASIC METABOLIC PANEL WITH GFR     Status: Abnormal   Collection  Time: 04/26/19  8:28 AM  Result Value Ref Range   Glucose, Bld 163 (H) 65 - 99 mg/dL    Comment: .            Fasting reference interval . For someone without known diabetes, a glucose value >125 mg/dL indicates that they may have diabetes and this should be confirmed with a follow-up test. .    BUN 10 7 - 25 mg/dL   Creat 1.12 0.60 - 1.35 mg/dL   GFR, Est Non African American 82 > OR = 60 mL/min/1.14m2   GFR, Est African American 95 > OR = 60 mL/min/1.19m2   BUN/Creatinine Ratio NOT APPLICABLE 6 - 22 (calc)   Sodium 139 135 - 146 mmol/L   Potassium 4.4 3.5 - 5.3 mmol/L   Chloride 101 98 - 110 mmol/L   CO2 23 20 - 32 mmol/L   Calcium 9.3 8.6 - 10.3 mg/dL  Hemoglobin A1c     Status: Abnormal   Collection Time: 04/26/19  8:28 AM  Result Value Ref Range   Hgb A1c MFr Bld 8.4 (H) <5.7 % of total Hgb    Comment: For someone without known diabetes, a hemoglobin A1c value of 6.5% or greater indicates that they may have  diabetes and this should be confirmed with a follow-up  test. . For someone with known diabetes, a value <7% indicates  that their diabetes is well controlled and a value  greater than or equal to 7% indicates suboptimal  control. A1c targets should be individualized based on  duration of diabetes, age, comorbid conditions, and  other considerations. . Currently, no consensus exists regarding use of hemoglobin A1c for diagnosis of diabetes for children. .    Mean Plasma Glucose 194 (calc)   eAG (mmol/L) 10.8 (calc)    -------------------------------------------------------------------------- A&P:  Problem List Items Addressed This Visit    Asthma   Relevant Medications   albuterol (VENTOLIN HFA) 108 (90 Base) MCG/ACT inhaler   Idiopathic chronic gout of multiple sites without tophus    Currently without acute gout flare Improved reduced flares on allopurinol. Previous recurrent gout flares, multiple joints, mostly foot/toe Uric acid down to 7.7  (03/2019) from previous 10 prior to treatment  Plan: 1. INCREASE Allopurinol from 100 up to 200mg  daily prophylaxis, new rx to double dose, may titrate up to 300mg  - May use Aleve, if acute flare then double dose to 500mg  BID for 7-10 days, notify office 2. Avoid food triggers (red meat, alcohol) 3. Follow-up 3 months repeat uric acid again      Relevant Medications   allopurinol (ZYLOPRIM) 100 MG tablet   Other Relevant Orders   Uric acid   BASIC METABOLIC PANEL WITH GFR   Type 2 diabetes mellitus with other specified complication (HCC) - Primary    Further Elevated A1c now up to 8.4, from previous 7.6, with hyperglycemia Remains OFF Metformin Complications - hyperlipidemia inc risk of cardiovascular complication, obesity, gout OFF Metformin XR due to GI intolerance and concern w/ potential side effect  Plan:  1. Discussed med options today - recommended GLP1 vs SGLT2 - Again defer GLP1 bc he has fam history of thyroid cancer, awaiting  his copy of health record with this info - START SGLT2 instead - Farxiga 5mg  daily, discussed risk benefit, side effect, ordered and put aside copay card for him to pick up 2. Encourage improved lifestyle - low carb, low sugar diet, reduce portion size, continue improving regular exercise 3. Check CBG, bring log to next visit for review 4. Future due for DM foot and DM eye 5. Follow-up 3 months DM A1c - consider add ons sulfonylurea vs DPP4       Relevant Medications   FARXIGA 5 MG TABS tablet   Other Relevant Orders   Hemoglobin I3K   BASIC METABOLIC PANEL WITH GFR      Meds ordered this encounter  Medications  . FARXIGA 5 MG TABS tablet    Sig: Take 5 mg by mouth daily.    Dispense:  30 tablet    Refill:  2  . allopurinol (ZYLOPRIM) 100 MG tablet    Sig: Take 2 tablets (200 mg total) by mouth daily.    Dispense:  60 tablet    Refill:  2  . albuterol (VENTOLIN HFA) 108 (90 Base) MCG/ACT inhaler    Sig: TAKE 2 PUFFS BY MOUTH EVERY 6  HOURS AS NEEDED FOR WHEEZE OR SHORTNESS OF BREATH    Dispense:  8.5 g    Refill:  3    Follow-up: - Return in 3 months for Diabetes, Gout lab results, face to face - Future labs ordered for 08/04/19 A1c BMET Uric Acid  Patient verbalizes understanding with the above medical recommendations including the limitation of remote medical advice.  Specific follow-up and call-back criteria were given for patient to follow-up or seek medical care more urgently if needed.   - Time spent in direct consultation with patient on phone: 12 minutes  Nobie Putnam, [facility] 04/29/2019, 11:50 AM

## 2019-04-30 ENCOUNTER — Other Ambulatory Visit: Payer: Self-pay | Admitting: Urology

## 2019-04-30 MED ORDER — XYOSTED 100 MG/0.5ML ~~LOC~~ SOAJ: 100.0000 mg | SUBCUTANEOUS | 3 refills | Status: DC

## 2019-04-30 NOTE — Assessment & Plan Note (Signed)
Further Elevated A1c now up to 8.4, from previous 7.6, with hyperglycemia Remains OFF Metformin Complications - hyperlipidemia inc risk of cardiovascular complication, obesity, gout OFF Metformin XR due to GI intolerance and concern w/ potential side effect  Plan:  1. Discussed med options today - recommended GLP1 vs SGLT2 - Again defer GLP1 bc he has fam history of thyroid cancer, awaiting his copy of health record with this info - START SGLT2 instead - Farxiga 5mg  daily, discussed risk benefit, side effect, ordered and put aside copay card for him to pick up 2. Encourage improved lifestyle - low carb, low sugar diet, reduce portion size, continue improving regular exercise 3. Check CBG, bring log to next visit for review 4. Future due for DM foot and DM eye 5. Follow-up 3 months DM A1c - consider add ons sulfonylurea vs DPP4

## 2019-04-30 NOTE — Assessment & Plan Note (Signed)
Currently without acute gout flare Improved reduced flares on allopurinol. Previous recurrent gout flares, multiple joints, mostly foot/toe Uric acid down to 7.7 (03/2019) from previous 10 prior to treatment  Plan: 1. INCREASE Allopurinol from 100 up to 200mg  daily prophylaxis, new rx to double dose, may titrate up to 300mg  - May use Aleve, if acute flare then double dose to 500mg  BID for 7-10 days, notify office 2. Avoid food triggers (red meat, alcohol) 3. Follow-up 3 months repeat uric acid again

## 2019-05-03 ENCOUNTER — Telehealth: Payer: Self-pay | Admitting: Family Medicine

## 2019-05-03 ENCOUNTER — Encounter: Payer: Self-pay | Admitting: Family Medicine

## 2019-05-03 DIAGNOSIS — E3122 Multiple endocrine neoplasia [MEN] type IIA: Secondary | ICD-10-CM

## 2019-05-03 DIAGNOSIS — Z1509 Genetic susceptibility to other malignant neoplasm: Secondary | ICD-10-CM

## 2019-05-03 NOTE — Telephone Encounter (Signed)
Called patient.  He is doing well on Farxiga just started in few days ago. He did pay initial copay he will use discount next time.  He dropped off printed results from [facility] genetic testing.  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 He has not been diagnosed with cancer. He was advised for Endocrinology f/u for prevention and surveillance.  He has not scheduled w/ Endocrinology since this result came in 03/2018. He is interested now.  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 Family members with Medullary Thyroid cancer. Patient was advised by genetic counselor to establish with Endocrinologist for discussing Endocrine Cancer prevention, he has copy of testing / results.   Alexander Karamalegos, DO [facility] 05/03/2019, 5:20 PM  

## 2019-05-17 ENCOUNTER — Other Ambulatory Visit: Payer: Self-pay | Admitting: Family Medicine

## 2019-05-17 DIAGNOSIS — M1A09X0 Idiopathic chronic gout, multiple sites, without tophus (tophi): Secondary | ICD-10-CM

## 2019-06-02 ENCOUNTER — Telehealth: Payer: Self-pay | Admitting: *Deleted

## 2019-06-02 NOTE — Telephone Encounter (Signed)
Received PA approval for Gaye Pollack #[id number]  Approved for 06/01/2019-05/31/2022  Faxed to pharmacy

## 2019-06-08 ENCOUNTER — Telehealth: Payer: Self-pay | Admitting: Urology

## 2019-06-08 NOTE — Telephone Encounter (Signed)
[facility] called and has a question about [place].  Pt can't afford.  Please call pharmacy  [phone].

## 2019-06-09 NOTE — Telephone Encounter (Signed)
Xyosted with PA approval and coupon will still cost 300.00. He will need a different medication. Please advise

## 2019-06-10 NOTE — Telephone Encounter (Signed)
He has a choice of testosterone gels or intramuscular injections.  His LH was also low and he would be a candidate for oral Clomid.  Any 3 of these is acceptable.  Does he have a preference?

## 2019-06-11 MED ORDER — CLOMIPHENE CITRATE 50 MG PO TABS: 25.0000 mg | ORAL_TABLET | Freq: Every day | ORAL | 1 refills | Status: DC

## 2019-06-11 NOTE — Telephone Encounter (Signed)
Patient notified and he is to call our office when he gets the medication in hand and schedule a lab appointment for Testosterone and a OV to see Stoioff in 6-8 weeks

## 2019-06-11 NOTE — Telephone Encounter (Signed)
Patient notified and is wanting to try the Clomid.

## 2019-06-11 NOTE — Telephone Encounter (Signed)
Pt following up on Rx, would like to discuss his options. Please advise. Thanks.

## 2019-06-11 NOTE — Telephone Encounter (Signed)
Rx sent.  Recommend follow-up office visit with testosterone level in 6-8 weeks

## 2019-06-15 ENCOUNTER — Telehealth: Payer: Self-pay

## 2019-06-15 NOTE — Telephone Encounter (Signed)
Incoming notice of approval from [facility] for Clomiphene 50mg . Dates 06/11/2019 - 06/10/2020. Pt informed document scanned.

## 2019-07-21 ENCOUNTER — Encounter: Payer: Self-pay | Admitting: Family Medicine

## 2019-07-21 DIAGNOSIS — R7989 Other specified abnormal findings of blood chemistry: Secondary | ICD-10-CM | POA: Insufficient documentation

## 2019-07-21 DIAGNOSIS — E349 Endocrine disorder, unspecified: Secondary | ICD-10-CM | POA: Insufficient documentation

## 2019-07-26 ENCOUNTER — Other Ambulatory Visit: Payer: Self-pay | Admitting: Family Medicine

## 2019-07-26 DIAGNOSIS — E1169 Type 2 diabetes mellitus with other specified complication: Secondary | ICD-10-CM

## 2019-08-04 ENCOUNTER — Telehealth: Payer: Self-pay | Admitting: Family Medicine

## 2019-08-04 ENCOUNTER — Other Ambulatory Visit: Payer: BC Managed Care – PPO

## 2019-08-04 DIAGNOSIS — E1169 Type 2 diabetes mellitus with other specified complication: Secondary | ICD-10-CM

## 2019-08-04 DIAGNOSIS — M1A09X0 Idiopathic chronic gout, multiple sites, without tophus (tophi): Secondary | ICD-10-CM

## 2019-08-04 NOTE — Telephone Encounter (Signed)
Lab order placed patient wants blood drawn in the afternoon so requested [facility] order.

## 2019-08-05 LAB — BASIC METABOLIC PANEL
BUN/Creatinine Ratio: 10 (ref 9–20)
BUN: 13 mg/dL (ref 6–24)
CO2: 26 mmol/L (ref 20–29)
Calcium: 9.6 mg/dL (ref 8.7–10.2)
Chloride: 101 mmol/L (ref 96–106)
Creatinine, Ser: 1.31 mg/dL — ABNORMAL HIGH (ref 0.76–1.27)
GFR calc Af Amer: 78 mL/min/{1.73_m2} (ref >59)
GFR calc non Af Amer: 68 mL/min/{1.73_m2} (ref >59)
Glucose: 110 mg/dL — ABNORMAL HIGH (ref 65–99)
Potassium: 4.3 mmol/L (ref 3.5–5.2)
Sodium: 140 mmol/L (ref 134–144)

## 2019-08-05 LAB — URIC ACID: Uric Acid: 5 mg/dL (ref 3.7–8.6)

## 2019-08-05 LAB — HEMOGLOBIN A1C
Est. average glucose Bld gHb Est-mCnc: 197 mg/dL
Hgb A1c MFr Bld: 8.5 % — ABNORMAL HIGH (ref 4.8–5.6)

## 2019-08-11 ENCOUNTER — Other Ambulatory Visit: Payer: Self-pay

## 2019-08-11 ENCOUNTER — Ambulatory Visit: Payer: BC Managed Care – PPO | Admitting: Family Medicine

## 2019-08-11 ENCOUNTER — Encounter: Payer: Self-pay | Admitting: Family Medicine

## 2019-08-11 VITALS — BP 139/66 | HR 78 | Temp 98.4°F | Resp 16 | Ht 75.0 in | Wt 318.0 lb

## 2019-08-11 DIAGNOSIS — M1A09X0 Idiopathic chronic gout, multiple sites, without tophus (tophi): Secondary | ICD-10-CM | POA: Diagnosis not present

## 2019-08-11 DIAGNOSIS — E1169 Type 2 diabetes mellitus with other specified complication: Secondary | ICD-10-CM

## 2019-08-11 DIAGNOSIS — F419 Anxiety disorder, unspecified: Secondary | ICD-10-CM

## 2019-08-11 DIAGNOSIS — E3122 Multiple endocrine neoplasia [MEN] type IIA: Secondary | ICD-10-CM

## 2019-08-11 MED ORDER — ESCITALOPRAM OXALATE 20 MG PO TABS: 20.0000 mg | ORAL_TABLET | Freq: Every day | ORAL | 2 refills | Status: DC

## 2019-08-11 MED ORDER — FLUOXETINE HCL 10 MG PO CAPS: ORAL_CAPSULE | ORAL | 0 refills | Status: DC

## 2019-08-11 MED ORDER — FARXIGA 10 MG PO TABS: 10.0000 mg | ORAL_TABLET | Freq: Every day | ORAL | 1 refills | Status: DC

## 2019-08-11 NOTE — Assessment & Plan Note (Signed)
Concern with recent life stressors, and health changes now worse anxiety Uncontrolled on fluoxetine  Goal to limit ED side effect and improve anxiety control  Switch agent - SSRI Fluoxetine to Escitalopram. - Taper rx written Fluoxetine 10mg  capsules x 2 daily for 20mg  daily for 1 week, then to 10mg  daily for 1 week then start Escitalopram HALF of 20mg  = 10mg  daily for 1-2 weeks then up to 20mg  as tolerated

## 2019-08-11 NOTE — Assessment & Plan Note (Signed)
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

## 2019-08-11 NOTE — Assessment & Plan Note (Signed)
Without gout flare Uric acid down to 5.0 on allopurinol 200mg  daily, continue current dose

## 2019-08-11 NOTE — Patient Instructions (Addendum)
Thank you for coming to the office today.  New medication changes  Increase Farxiga 5 to 10mg  - use the old ones up and then get new ones  Activate coupon card with manufacturer  Taper down on Fluoxetine Start Lexapro when ready as advised               escitalopram (LEXAPRO) 20 MG tablet                    Take 1 tablet (20 mg total) by mouth daily. Start with half pill for 10mg  daily for first 1-2 weeks then may increase up to full pill 20mg  if needed, Starting Wed 08/11/2019, Normal               FLUoxetine (PROZAC) 10 MG capsule                    Take 2 capsules (20mg ) daily for 1 week, then reduce to 1 capsule (10mg ) daily for up to 7 more days, then switch to Lexapro, Normal    Please schedule a Follow-up Appointment to: Return in about 4 months (around 12/09/2019) for 4 month follow-up DM A1c, Anxiety PHQ / GAD med adjust.  If you have any other questions or concerns, please feel free to call the office or send a message through [facility]. You may also schedule an earlier appointment if necessary.  Additionally, you may be receiving a survey about your experience at our office within a few days to 1 week by e-mail or mail. We value your feedback.  Nobie Putnam, DO [place]

## 2019-08-11 NOTE — Assessment & Plan Note (Signed)
Elevated A1c now up to 8.5 without improvement on Farxiga, attributed to lifestyle / stress Remains OFF Metformin Complications - hyperlipidemia inc risk of cardiovascular complication, obesity, gout OFF Metformin XR due to GI intolerance and concern w/ potential side effect  Plan:  1. INCREASE Farxiga from 5 to 10mg  daily - advised him to register coupon discord from manufacturer sent rx 2. Encourage improved lifestyle - low carb, low sugar diet, reduce portion size, continue improving regular exercise 3. Check CBG, bring log to next visit for review 4. DM Foot exam done. Request DM Eye  Future follow-up 4 months

## 2019-08-11 NOTE — Assessment & Plan Note (Signed)
Abnormal weight, relatively stable in past 1 year Encourage improve lifestyle diet exercise

## 2019-08-11 NOTE — Progress Notes (Signed)
Subjective:    Patient ID: Aaron Schwartz, male    DOB: [birth year], 38 y.o.   MRN: [id number]  Aaron Schwartz is a 38 y.o. male presenting on 08/11/2019 for Diabetes   HPI   CHRONIC DM, Type 2 / Elevated Creatinine / Morbid Obesity BMI >39 - Last visit with mefor DM 05/2019 - added Farxiga 20m daily he was not candidate for GLP1 due to diagnosis of MEN diagnosis and fam history medullary thyroid cancer, see prior notes for background information. - Interval updates -now on farxiga tolerating well, increased urination, trying to stay hydrated, remained off metformin due to GI intolerance, self discontinued - Today now has elevated A1c up to 8.5 - he says not following lifestyle as much. He has not stayed as hydrated as he could. Medicine makes him urinate more. But he thinks it is working. 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 He was on OtMankatout felt worse then came off of this and now feels mood is better. He has been on Fluoxetine for 5-10 years and has done well on it overall. He does admit some side effect with erectile dysfunction. He feels like with a lot of stressors including MEN thyroid diagnosis he is more anxious and would like to re-address this medication, he feels "on edge" more - Additionally with endocrinology, identified pheochromocytoma as well and likely thought that this is contributing to his anxiety with symptoms. Also anticipated future thyroidectomy. He was started on Doxazosin for alpha blocker for pheo surgery coming up.  Follow-up Gout Last lab Uric Acid 5.0, currently on Allopurinol 20070maily (x2 of the  100m72mbs) for prevention he has not had any recent gout flares that are significant. He has had occasional mild pain resolves with Aleve.    Health Maintenance:  Due for Flu Shot, declines today despite counseling on benefits  Declined pneumonia vaccine in setting of diabetes.  Depression screen PHQ [facility] 04/29/2019 02/17/2019 08/20/2017  Decreased Interest 1 0 1  Down, Depressed, Hopeless 1 0 1  PHQ - 2 Score 2 0 2  Altered sleeping 1 - 0  Tired, decreased energy 0 - 2  Change in appetite 1 - 0  Feeling bad or failure about yourself  0 - 0  Trouble concentrating 0 - 0  Moving slowly or fidgety/restless 0 - 1  Suicidal thoughts 0 - 0  PHQ-9 Score 4 - 5  Difficult doing work/chores Not difficult at all - Not difficult at all   GAD 7 : Generalized Anxiety Score 04/29/2019 02/17/2019 08/20/2017 04/03/2017  Nervous, Anxious, on Edge 2 1 1 1   Control/stop worrying 2 1 1 1   Worry too much - different things 2 1 1 1   Trouble relaxing 2 1 1 1   Restless 1 1 0 1  Easily annoyed or irritable 1 1 0 1  Afraid - awful might happen 2 1 0 0  Total GAD 7 Score 12 7 4 6   Anxiety Difficulty Somewhat difficult Not difficult at all Not difficult at all Not difficult at all     Social History   Tobacco Use  . Smoking status: Never Smoker  . Smokeless tobacco: Never Used  Substance Use  Topics  . Alcohol use: Yes    Comment: occasionally   . Drug use: No    Review of Systems Per HPI unless specifically indicated above     Objective:    BP 139/66   Pulse 78   Temp 98.4 F (36.9 C) (Oral)   Resp 16   Ht 6' 3"  (1.905 m)   Wt (!) 318 lb (144.2 kg)   BMI 39.75 kg/m   Wt Readings from Last 3 Encounters:  08/11/19 (!) 318 lb (144.2 kg)  04/15/19 (!) 319 lb 9.6 oz (145 kg)  10/10/18 (!) 320 lb (145.2 kg)    Physical Exam Vitals signs and nursing note reviewed.  Constitutional:      General: He is not in acute distress.    Appearance: He is well-developed. He is not diaphoretic.      Comments: Well-appearing, comfortable, cooperative  HENT:     Head: Normocephalic and atraumatic.  Eyes:     General:        Right eye: No discharge.        Left eye: No discharge.     Conjunctiva/sclera: Conjunctivae normal.  Cardiovascular:     Rate and Rhythm: Normal rate.  Pulmonary:     Effort: Pulmonary effort is normal.  Skin:    General: Skin is warm and dry.     Findings: No erythema or rash.  Neurological:     Mental Status: He is alert and oriented to person, place, and time.  Psychiatric:        Behavior: Behavior normal.     Comments: Well groomed, good eye contact, normal speech and thoughts      Diabetic Foot Exam - Simple   Simple Foot Form Diabetic Foot exam was performed with the following findings: Yes 08/11/2019  2:38 PM  Visual Inspection No deformities, no ulcerations, no other skin breakdown bilaterally: Yes Sensation Testing Intact to touch and monofilament testing bilaterally: Yes Pulse Check Posterior Tibialis and Dorsalis pulse intact bilaterally: Yes Comments       Recent Labs    02/11/19 0806 04/26/19 0828 08/04/19 1619  HGBA1C 7.6* 8.4* 8.5*      Results for orders placed or performed in visit on 07/37/10  Basic Metabolic Panel (BMET)  Result Value Ref Range   Glucose 110 (H) 65 - 99 mg/dL   BUN 13 6 - 24 mg/dL   Creatinine, Ser 1.31 (H) 0.76 - 1.27 mg/dL   GFR calc non Af Amer 68 >59 mL/min/1.73   GFR calc Af Amer 78 >59 mL/min/1.73   BUN/Creatinine Ratio 10 9 - 20   Sodium 140 134 - 144 mmol/L   Potassium 4.3 3.5 - 5.2 mmol/L   Chloride 101 96 - 106 mmol/L   CO2 26 20 - 29 mmol/L   Calcium 9.6 8.7 - 10.2 mg/dL  Uric acid  Result Value Ref Range   Uric Acid 5.0 3.7 - 8.6 mg/dL  Hemoglobin A1c  Result Value Ref Range   Hgb A1c MFr Bld 8.5 (H) 4.8 - 5.6 %   Est. average glucose Bld gHb Est-mCnc 197 mg/dL      Assessment & Plan:   Problem List Items Addressed This Visit    Type 2 diabetes mellitus with other  specified complication (HCC) - Primary    Elevated A1c now up to 8.5 without improvement on Farxiga, attributed to lifestyle / stress Remains OFF Metformin Complications - hyperlipidemia inc risk of cardiovascular complication, obesity, gout OFF Metformin XR due  to GI intolerance and concern w/ potential side effect  Plan:  1. INCREASE Farxiga from 5 to 53m daily - advised him to register coupon discord from manufacturer sent rx 2. Encourage improved lifestyle - low carb, low sugar diet, reduce portion size, continue improving regular exercise 3. Check CBG, bring log to next visit for review 4. DM Foot exam done. 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 ([place]    [facility] Anticipated surgery for thyroidectomy / adrenalectomy for possible pheo See note in care everywhere      Morbid obesity ([place]    Abnormal weight, relatively stable in past 1 year Encourage improve lifestyle diet exercise      Relevant Medications   dapagliflozin propanediol (FARXIGA) 10 MG TABS tablet   Idiopathic chronic gout of multiple sites without tophus    Without gout flare Uric acid down to 5.0 on allopurinol 2029mdaily, continue current dose      Anxiety    Concern with recent life stressors, and health changes now worse anxiety Uncontrolled on fluoxetine  Goal to limit ED side effect and improve anxiety control  Switch agent - SSRI Fluoxetine to Escitalopram. - Taper rx written Fluoxetine 1017mapsules x 2 daily for 65m76mily for 1 week, then to 10mg84mly for 1 week then start Escitalopram HALF of 65mg 35mmg d80m for 1-2 weeks then up to 65mg as48merated      Relevant Medications   escitalopram (LEXAPRO) 20 MG tablet   FLUoxetine (PROZAC) 10 MG capsule      Meds ordered this encounter  Medications  .  dapagliflozin propanediol (FARXIGA) 10 MG TABS tablet    Sig: Take 10 mg by mouth daily before breakfast.    Dispense:  90 tablet    Refill:  1    Increased dose from 5 to 10  . escitalopram (LEXAPRO) 20 MG tablet    Sig: Take 1 tablet (20 mg total) by mouth daily. Start with half pill for 10mg dai35mor first 1-2 weeks then may increase up to full pill 65mg if n90md    Dispense:  30 tablet    Refill:  2  . FLUoxetine (PROZAC) 10 MG capsule    Sig: Take 2 capsules (65mg) dail27mr 1 week, then reduce to 1 capsule (10mg) daily29m up to 7 more days, then switch to Lexapro    Dispense:  21 capsule    Refill:  0    Follow up plan: Return in about 4 months (around 12/09/2019) for 4 month follow-up DM A1c, Anxiety PHQ / GAD med adjust.    Aaron KaNobie PutnamrahamO[place]/2020, 2:02 PM

## 2019-09-02 ENCOUNTER — Telehealth: Payer: BC Managed Care – PPO | Admitting: Physician Assistant

## 2019-09-02 DIAGNOSIS — R0981 Nasal congestion: Secondary | ICD-10-CM | POA: Diagnosis not present

## 2019-09-02 DIAGNOSIS — J349 Unspecified disorder of nose and nasal sinuses: Secondary | ICD-10-CM | POA: Diagnosis not present

## 2019-09-02 MED ORDER — AFRIN NASAL SPRAY 0.05 % NA SOLN: 1.0000 | Freq: Two times a day (BID) | NASAL | 0 refills | Status: DC

## 2019-09-02 NOTE — Progress Notes (Signed)
We are sorry that you are not feeling well.  Here is how we plan to help!  Based on what you have shared with me it looks like you have sinusitis.  Sinusitis is inflammation and infection in the sinus cavities of the head.  Based on your presentation I believe you most likely have Acute Viral Sinusitis.This is an infection most likely caused by a virus. There is not specific treatment for viral sinusitis other than to help you with the symptoms until the infection runs its course.  The vast majority (80%) of sinus infections are viral and do not require treatment with antibiotics. You may use an oral decongestant such as Mucinex D or if you have glaucoma or high blood pressure use plain Mucinex. Saline nasal spray help and can safely be used as often as needed for congestion, I have prescribed: Afrin nasal spray, one spray in each nostril twice daily. This is a very powerful decongestant. DO NOT USE THIS FOR LONGER THAN 3 DAYS, as it can cause rebound (worsening) congestion if used for too long. You can also use over the counter allergy medications such as zyrtec or benadryl for your congestion.    Some authorities believe that zinc sprays or the use of Echinacea may shorten the course of your symptoms.  Sinus infections are not as easily transmitted as other respiratory infection, however we still recommend that you avoid close contact with loved ones, especially the very young and elderly.  Remember to wash your hands thoroughly throughout the day as this is the number one way to prevent the spread of infection!  Home Care:  Only take medications as instructed by your medical team.  Do not take these medications with alcohol.  A steam or ultrasonic humidifier can help congestion.  You can place a towel over your head and breathe in the steam from hot water coming from a faucet.  Avoid close contacts especially the very young and the elderly.  Cover your mouth when you cough or sneeze.  Always  remember to wash your hands.  Get Help Right Away If:  You develop worsening fever or sinus pain.  You develop a severe head ache or visual changes.  Your symptoms persist after you have completed your treatment plan.  Make sure you  Understand these instructions.  Will watch your condition.  Will get help right away if you are not doing well or get worse.  Your e-visit answers were reviewed by a board certified advanced clinical practitioner to complete your personal care plan.  Depending on the condition, your plan could have included both over the counter or prescription medications.  If there is a problem please reply  once you have received a response from your provider.  Your safety is important to [place].  If you have drug allergies check your prescription carefully.    You can use MyChart to ask questions about today's visit, request a non-urgent call back, or ask for a work or school excuse for 24 hours related to this e-Visit. If it has been greater than 24 hours you will need to follow up with your provider, or enter a new e-Visit to address those concerns.  You will get an e-mail in the next two days asking about your experience.  I hope that your e-visit has been valuable and will speed your recovery. Thank you for using e-visits.  Greater than 5 minutes, yet less than 10 minutes of time have been spent researching, coordinating, and implementing  care for this patient today.

## 2019-09-03 ENCOUNTER — Other Ambulatory Visit: Payer: Self-pay | Admitting: Family Medicine

## 2019-09-03 DIAGNOSIS — F419 Anxiety disorder, unspecified: Secondary | ICD-10-CM

## 2019-09-16 ENCOUNTER — Other Ambulatory Visit: Payer: Self-pay | Admitting: Family Medicine

## 2019-09-16 DIAGNOSIS — I10 Essential (primary) hypertension: Secondary | ICD-10-CM

## 2019-10-11 ENCOUNTER — Other Ambulatory Visit: Payer: Self-pay | Admitting: Urology

## 2019-10-12 NOTE — Telephone Encounter (Signed)
He has not had a follow-up visit for labs since starting Clomid.  He needs a testosterone, PSA and hematocrit level with a follow-up visit.  Limited refill was sent.

## 2019-10-13 ENCOUNTER — Other Ambulatory Visit: Payer: Self-pay | Admitting: *Deleted

## 2019-10-13 DIAGNOSIS — E291 Testicular hypofunction: Secondary | ICD-10-CM

## 2019-10-20 ENCOUNTER — Other Ambulatory Visit: Payer: Self-pay | Admitting: Family Medicine

## 2019-10-20 DIAGNOSIS — J209 Acute bronchitis, unspecified: Secondary | ICD-10-CM

## 2019-10-20 DIAGNOSIS — J302 Other seasonal allergic rhinitis: Secondary | ICD-10-CM

## 2019-11-10 ENCOUNTER — Ambulatory Visit: Payer: BC Managed Care – PPO | Admitting: Urology

## 2019-12-06 ENCOUNTER — Other Ambulatory Visit: Payer: Self-pay | Admitting: Family Medicine

## 2019-12-06 ENCOUNTER — Other Ambulatory Visit: Payer: Self-pay

## 2019-12-06 ENCOUNTER — Ambulatory Visit (INDEPENDENT_AMBULATORY_CARE_PROVIDER_SITE_OTHER): Payer: BC Managed Care – PPO | Admitting: Family Medicine

## 2019-12-06 ENCOUNTER — Encounter: Payer: Self-pay | Admitting: Family Medicine

## 2019-12-06 VITALS — BP 131/70 | HR 76 | Temp 98.7°F | Resp 16 | Ht 75.0 in | Wt 305.0 lb

## 2019-12-06 DIAGNOSIS — E3122 Multiple endocrine neoplasia [MEN] type IIA: Secondary | ICD-10-CM

## 2019-12-06 DIAGNOSIS — E1169 Type 2 diabetes mellitus with other specified complication: Secondary | ICD-10-CM

## 2019-12-06 DIAGNOSIS — C73 Malignant neoplasm of thyroid gland: Secondary | ICD-10-CM | POA: Diagnosis not present

## 2019-12-06 DIAGNOSIS — E559 Vitamin D deficiency, unspecified: Secondary | ICD-10-CM

## 2019-12-06 DIAGNOSIS — J4520 Mild intermittent asthma, uncomplicated: Secondary | ICD-10-CM

## 2019-12-06 DIAGNOSIS — D3500 Benign neoplasm of unspecified adrenal gland: Secondary | ICD-10-CM

## 2019-12-06 DIAGNOSIS — I10 Essential (primary) hypertension: Secondary | ICD-10-CM

## 2019-12-06 DIAGNOSIS — Z0000 Encounter for general adult medical examination without abnormal findings: Secondary | ICD-10-CM

## 2019-12-06 LAB — POCT GLYCOSYLATED HEMOGLOBIN (HGB A1C): Hemoglobin A1C: 7.3 % — AB (ref 4.0–5.6)

## 2019-12-06 MED ORDER — ALBUTEROL SULFATE HFA 108 (90 BASE) MCG/ACT IN AERS: INHALATION_SPRAY | RESPIRATORY_TRACT | 3 refills | Status: DC

## 2019-12-06 NOTE — Assessment & Plan Note (Signed)
Improving weight loss with lifestyle diet exercise, now post-op from MEN2A likely thyroid was affecting his function

## 2019-12-06 NOTE — Assessment & Plan Note (Signed)
Improved A1c down to 7.3% now improved with lifestyle post op MEN2A thyroid/pheo Remains OFF Metformin Complications - hyperlipidemia inc risk of cardiovascular complication, obesity, gout OFF Metformin XR due to GI intolerance and concern w/ potential side effect  Plan:  1. Continue Farxiga 29m daily 2. Encourage improved lifestyle - low carb, low sugar diet, reduce portion size, continue improving regular exercise 3. Check CBG, bring log to next visit for review 4. [facility]

## 2019-12-06 NOTE — Assessment & Plan Note (Signed)
Followed by [facility] and [facility] Post-op with some vocal cord hoarseness, due to recurrent laryngeal nerve issue, now improving, may be due for repeat steroid injection, anticipated 2nd surgery for complete thyroidectomy in 02/2020

## 2019-12-06 NOTE — Progress Notes (Signed)
Subjective:    Patient ID: Aaron Schwartz, male    DOB: 16-Aug-1981, 39 y.o.   MRN: [id number]  [place] Gross[place] is a 39 y.o. male presenting on 12/06/2019 for Diabetes   HPI   CHRONIC DM, Type 2 / Morbid Obesity BMI >38 See HPI below post op from R thyroidectomy and R adrenalectomy - briefly, now Post-op, has had reduced appetite and decreased portion intake overall. He is resting better after thyroid surgery, has more energy He feels better overall Last A1c >8 Now lab shows A1c 7.3% CBGs:None recently Meds: Farxiga 56m daily - tolerating well, staying hydrated. 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 He is waiting to return to ENT may benefit from steroid injection in vocal cords to help. - Anticipating Surgical procedure #2 in June 2021 for removing rest of thyroid [facility]  Asthma Without flare up. He request refill on albuterol inhaler.  [facility] 12/06/2019 04/29/2019 02/17/2019  Decreased Interest 0 1 0  Down, Depressed, Hopeless 0 1 0  PHQ - 2 Score 0 2 0  Altered sleeping 0 1 -  Tired, decreased energy 0 0 -  Change in appetite 0 1 -  Feeling bad or failure about yourself  0 0 -  Trouble concentrating 0 0 -  Moving slowly or fidgety/restless 0 0 -  Suicidal thoughts 0 0 -  PHQ-9 Score 0 4 -  Difficult doing work/chores Not difficult at all Not difficult at all -    GAD 7 : Generalized Anxiety Score 12/06/2019 04/29/2019 02/17/2019 08/20/2017  Nervous, Anxious, on Edge 1 2 1 1   Control/stop worrying 1 2 1 1   Worry too much - different things 1 2 1 1   Trouble relaxing 1 2 1 1   Restless 0 1 1 0  Easily annoyed or irritable 0 1 1 0  Afraid - awful might happen 0 2 1 0  Total GAD 7 Score 4 12 7 4   Anxiety Difficulty Not difficult at all Somewhat difficult Not difficult at all Not difficult at all     Social History   Tobacco Use  . Smoking status: Never Smoker  . Smokeless tobacco: Never Used  Substance Use Topics  . Alcohol use: Yes    Comment: occasionally   . Drug use: No    Review of Systems Per HPI unless specifically indicated above     Objective:    BP 131/70   Pulse 76   Temp 98.7 F (37.1 C) (Temporal)   Resp 16   Ht 6' 3"  (1.905 m)   Wt (!) 305 lb (138.3 kg)   BMI 38.12 kg/m   Wt Readings from Last 3 Encounters:  12/06/19 (!) 305 lb (138.3 kg)  08/11/19 (!) 318 lb (144.2 kg)  04/15/19 (!) 319 lb 9.6 oz (145 kg)    Physical Exam Vitals and nursing note reviewed.  Constitutional:      General: He is not in acute distress.    Appearance: He is well-developed.  He is not diaphoretic.     Comments: Well-appearing, comfortable, cooperative, weight loss  HENT:     Head: Normocephalic and atraumatic.     Comments: Mild hoarse voice, improved Eyes:     General:        Right eye: No discharge.        Left eye: No discharge.     Conjunctiva/sclera: Conjunctivae normal.  Cardiovascular:     Rate and Rhythm: Normal rate.  Pulmonary:     Effort: Pulmonary effort is normal.  Skin:    General: Skin is warm and dry.     Findings: No erythema or rash.  Neurological:     Mental Status: He is alert and oriented to person, place, and time.  Psychiatric:        Behavior: Behavior normal.     Comments: Well groomed, good eye contact, normal speech and thoughts      Recent Labs    04/26/19 0828 08/04/19 1619  12/06/19 0830  HGBA1C 8.4* 8.5* 7.3*    Results for orders placed or performed in visit on 12/06/19  POCT HgB A1C  Result Value Ref Range   Hemoglobin A1C 7.3 (A) 4.0 - 5.6 %      Assessment & Plan:   Problem List Items Addressed This Visit    Type 2 diabetes mellitus with other specified complication ([place]) - Primary    Improved A1c down to 7.3% now improved with lifestyle post op MEN2A thyroid/pheo Remains OFF Metformin Complications - hyperlipidemia inc risk of cardiovascular complication, obesity, gout OFF Metformin XR due to GI intolerance and concern w/ potential side effect  Plan:  1. Continue Farxiga 9m daily 2. Encourage improved lifestyle - low carb, low sugar diet, reduce portion size, continue improving regular exercise 3. Check CBG, bring log to next visit for review 4. [facility]      Relevant Orders   POCT HgB A1C (Completed)   Morbid obesity ([place]    Improving weight loss with lifestyle diet exercise, now post-op from MEN2A likely thyroid was affecting his function      MEN 2A syndrome ([place]    [facility] Post-op with some vocal cord hoarseness, due to recurrent laryngeal nerve issue, now improving, may be due for repeat steroid injection, anticipated 2nd surgery for complete thyroidectomy in 02/2020      Medullary thyroid carcinoma and pheochromocytoma (HCC)   Asthma    Controlled w/o exacerbation Refill Albuterol PRN      Relevant Medications   albuterol (VENTOLIN HFA) 108 (90 Base) MCG/ACT inhaler      Meds ordered this encounter  Medications  . albuterol (VENTOLIN HFA) 108 (90 Base) MCG/ACT inhaler    Sig: TAKE 2 PUFFS BY MOUTH EVERY 6 HOURS AS NEEDED FOR WHEEZE OR SHORTNESS OF BREATH    Dispense:  8.5 g    Refill:  3      Follow up plan: Return in about 5 months (around 05/07/2020) for Annual Physical.  Future labs ordered for 04/21/20  ANobie Putnam  [facility] 12/06/2019, 8:21 AM

## 2019-12-06 NOTE — Assessment & Plan Note (Signed)
Controlled w/o exacerbation Refill Albuterol PRN

## 2019-12-06 NOTE — Patient Instructions (Addendum)
Thank you for coming to the office today.  Request record from [facility] from 08/2019  Keep up good work overall!  Recent Labs    04/26/19 0828 08/04/19 1619 12/06/19 0830  HGBA1C 8.4* 8.5* 7.3*    Keep on Farxiga 10mg  daily  DUE for FASTING BLOOD WORK (no food or drink after midnight before the lab appointment, only water or coffee without cream/sugar on the morning of)  SCHEDULE "Lab Only" visit in the morning at the clinic for lab draw in 5 MONTHS   - Make sure Lab Only appointment is at about 1 week before your next appointment, so that results will be available  For Lab Results, once available within 2-3 days of blood draw, you can can log in to MyChart online to view your results and a brief explanation. Also, we can discuss results at next follow-up visit.   Please schedule a Follow-up Appointment to: Return in about 5 months (around 05/07/2020) for Annual Physical.  If you have any other questions or concerns, please feel free to call the office or send a message through [place]. You may also schedule an earlier appointment if necessary.  Additionally, you may be receiving a survey about your experience at our office within a few days to 1 week by e-mail or mail. We value your feedback.  Nobie Putnam, DO [place]

## 2019-12-08 ENCOUNTER — Ambulatory Visit (INDEPENDENT_AMBULATORY_CARE_PROVIDER_SITE_OTHER): Payer: BC Managed Care – PPO | Admitting: Urology

## 2019-12-08 ENCOUNTER — Other Ambulatory Visit: Payer: Self-pay

## 2019-12-08 ENCOUNTER — Encounter: Payer: Self-pay | Admitting: Urology

## 2019-12-08 VITALS — BP 138/82 | HR 86 | Ht 75.0 in | Wt 305.0 lb

## 2019-12-08 DIAGNOSIS — E291 Testicular hypofunction: Secondary | ICD-10-CM

## 2019-12-08 NOTE — Progress Notes (Signed)
12/08/2019 11:26 AM   Aaron Schwartz [birth year] [id number]  Referring provider: Olin Hauser, DO [address] [place],  [place] [address]  Chief Complaint  Patient presents with  . Follow-up    HPI: 39 yo male presents for follow-up.  -Seen 03/2019 for hypogonadism -Symptoms tiredness, fatigue, low energy, decreased libido -Elected initial trial Clomid; started September 2020 -Has not been seen in follow-up since starting -Has noted mild improvement in his symptoms since starting Clomid and also have a thyroid surgery. -Labs have not been rechecked since starting treatment   PMH: Past Medical History:  Diagnosis Date  . Allergy   . Asthma   . Depression   . Hypertension     Surgical History: Past Surgical History:  Procedure Laterality Date  . none      Home Medications:  Allergies as of 12/08/2019      Reactions   Gramineae Pollens       Medication List       Accurate as of December 08, 2019 11:26 AM. If you have any questions, ask your nurse or doctor.        STOP taking these medications   Afrin Nasal Spray 0.05 % nasal spray Generic drug: oxymetazoline Stopped by: Abbie Sons, MD   doxazosin 4 MG tablet Commonly known as: CARDURA Stopped by: Abbie Sons, MD   escitalopram 20 MG tablet Commonly known as: LEXAPRO Stopped by: Abbie Sons, MD   Fluocinolone Acetonide Scalp 0.01 % Oil Stopped by: Abbie Sons, MD   olopatadine 0.1 % ophthalmic solution Commonly known as: Patanol Stopped by: Abbie Sons, MD   triamcinolone ointment 0.5 % Commonly known as: KENALOG Stopped by: Abbie Sons, MD   Xyosted 100 MG/0.5ML Soaj Generic drug: Testosterone Enanthate Stopped by: Abbie Sons, MD     TAKE these medications   albuterol 108 (90 Base) MCG/ACT inhaler Commonly known as: VENTOLIN HFA TAKE 2 PUFFS BY MOUTH EVERY 6 HOURS AS NEEDED FOR WHEEZE OR SHORTNESS OF BREATH   allopurinol 100 MG tablet Commonly known  as: ZYLOPRIM Take 2 tablets (200 mg total) by mouth daily.   cetirizine 10 MG tablet Commonly known as: ZYRTEC TAKE 1 TABLET BY MOUTH EVERY DAY   clomiPHENE 50 MG tablet Commonly known as: CLOMID TAKE 1/2 TABLET BY MOUTH DAILY   Farxiga 10 MG Tabs tablet Generic drug: dapagliflozin propanediol Take 10 mg by mouth daily before breakfast.   fluocinonide cream 0.05 % Commonly known as: LIDEX Apply 1 application topically 2 (two) times daily.   FLUoxetine 10 MG capsule Commonly known as: PROZAC Take 2 capsules (20mg ) daily for 1 week, then reduce to 1 capsule (10mg ) daily for up to 7 more days, then switch to Lexapro   fluticasone 50 MCG/ACT nasal spray Commonly known as: FLONASE SPRAY 2 SPRAYS INTO EACH NOSTRIL EVERY DAY   lisinopril 20 MG tablet Commonly known as: ZESTRIL TAKE 1 TABLET BY MOUTH EVERY DAY   Otezla 30 MG Tabs Generic drug: Apremilast       Allergies:  Allergies  Allergen Reactions  . Gramineae Pollens     Family History: Family History  Problem Relation Age of Onset  . Hypertension Mother   . Thyroid cancer Mother     Social History:  reports that he has never smoked. He has never used smokeless tobacco. He reports current alcohol use. He reports that he does not use drugs.   Physical Exam: BP 138/82  Pulse 86   Ht 6\' 3"  (1.905 m)   Wt (!) 305 lb (138.3 kg)   BMI 38.12 kg/m   Constitutional:  Alert and oriented, No acute distress. HEENT: [place] AT, moist mucus membranes.  Trachea midline, no masses. Cardiovascular: No clubbing, cyanosis, or edema. Respiratory: Normal respiratory effort, no increased work of breathing. Skin: No rashes, bruises or suspicious lesions. Neurologic: Grossly intact, no focal deficits, moving all 4 extremities. Psychiatric: Normal mood and affect.   Assessment & Plan:    - Hypogonadism Mild improvement in symptoms on Clomid.  Testosterone, hematocrit and PSA drawn today.  If testosterone level >500 we  discussed that some patients have improvement in levels but not symptoms on Clomid and discuss switching to TRT.  If level improved but <500 will increase Clomid dose.   Abbie Sons, [place] [address] [place], [place] [address] [phone]

## 2019-12-09 ENCOUNTER — Telehealth: Payer: Self-pay | Admitting: Urology

## 2019-12-09 LAB — PSA: Prostate Specific Ag, Serum: 0.5 ng/mL (ref 0.0–4.0)

## 2019-12-09 LAB — HEMOGLOBIN AND HEMATOCRIT, BLOOD
Hematocrit: 46.4 % (ref 37.5–51.0)
Hemoglobin: 14.9 g/dL (ref 13.0–17.7)

## 2019-12-09 LAB — TESTOSTERONE: Testosterone: 392 ng/dL (ref 264–916)

## 2019-12-09 MED ORDER — CLOMIPHENE CITRATE 50 MG PO TABS: 50.0000 mg | ORAL_TABLET | Freq: Every day | ORAL | 1 refills | Status: DC

## 2019-12-09 NOTE — Telephone Encounter (Signed)
Testosterone level was better at 392 but in the low normal range.  We can try increasing the Clomid or if he desires to switch to testosterone that can also be done.

## 2019-12-09 NOTE — Telephone Encounter (Signed)
Increase to 1 tab daily.  Rx sent to pharmacy.  Follow-up lab visit 6 weeks for testosterone and hepatic function panel.

## 2019-12-09 NOTE — Telephone Encounter (Signed)
Patient would like to try to increasing the Clomid  First it does not help than gel or shots.

## 2019-12-10 NOTE — Telephone Encounter (Signed)
Notified patient as instructed, patient pleased. Discussed follow-up appointments, patient agrees  

## 2019-12-29 ENCOUNTER — Other Ambulatory Visit: Payer: Self-pay | Admitting: Family Medicine

## 2019-12-29 DIAGNOSIS — M1A09X0 Idiopathic chronic gout, multiple sites, without tophus (tophi): Secondary | ICD-10-CM

## 2020-01-20 ENCOUNTER — Other Ambulatory Visit: Payer: Self-pay

## 2020-01-20 ENCOUNTER — Other Ambulatory Visit: Payer: BC Managed Care – PPO

## 2020-01-20 DIAGNOSIS — E291 Testicular hypofunction: Secondary | ICD-10-CM

## 2020-01-21 LAB — HEPATIC FUNCTION PANEL
ALT: 10 IU/L (ref 0–44)
AST: 14 IU/L (ref 0–40)
Albumin: 4.4 g/dL (ref 4.0–5.0)
Alkaline Phosphatase: 58 IU/L (ref 39–117)
Bilirubin Total: 0.4 mg/dL (ref 0.0–1.2)
Bilirubin, Direct: 0.12 mg/dL (ref 0.00–0.40)
Total Protein: 7.4 g/dL (ref 6.0–8.5)

## 2020-01-21 LAB — TESTOSTERONE: Testosterone: 713 ng/dL (ref 264–916)

## 2020-01-24 ENCOUNTER — Telehealth: Payer: Self-pay | Admitting: *Deleted

## 2020-01-24 NOTE — Telephone Encounter (Signed)
-----   Message from Abbie Sons, MD sent at 01/23/2020 10:58 AM EDT ----- Testosterone level looks much better at 713.  Can continue Clomid if desired.  Follow-up appointment September 2021 with testosterone, PSA, hematocrit and hepatic function panel

## 2020-01-24 NOTE — Telephone Encounter (Signed)
Notified patient as instructed, patient pleased. Discussed follow-up appointments, patient agrees  

## 2020-02-04 ENCOUNTER — Other Ambulatory Visit: Payer: Self-pay | Admitting: Family Medicine

## 2020-02-04 DIAGNOSIS — E1169 Type 2 diabetes mellitus with other specified complication: Secondary | ICD-10-CM

## 2020-02-21 ENCOUNTER — Other Ambulatory Visit: Payer: Self-pay | Admitting: Urology

## 2020-02-24 ENCOUNTER — Other Ambulatory Visit: Payer: Self-pay | Admitting: Family Medicine

## 2020-02-24 DIAGNOSIS — F419 Anxiety disorder, unspecified: Secondary | ICD-10-CM

## 2020-02-24 NOTE — Telephone Encounter (Signed)
Requested medication (s) are due for refill today: yes  Requested medication (s) are on the active medication list: no  Last refill:  11/28/19  Future visit scheduled: yes  Notes to clinic:  d/c'd by Dr Bernardo Heater 12/08/19   Requested Prescriptions  Pending Prescriptions Disp Refills   escitalopram (LEXAPRO) 20 MG tablet [Pharmacy Med Name: ESCITALOPRAM 20 MG TABLET] 90 tablet 1    Sig: TAKE 1 [place]      Psychiatry:  Antidepressants - SSRI Passed - 02/24/2020  1:35 AM      Passed - Valid encounter within last 6 months    Recent Outpatient Visits           2 months ago Type 2 diabetes mellitus with other specified complication, without long-term current use of insulin ([place])   [facility], Devonne Doughty, DO   6 months ago Type 2 diabetes mellitus with other specified complication, without long-term current use of insulin ([place])   [facility], Devonne Doughty, DO   10 months ago Type 2 diabetes mellitus with other specified complication, without long-term current use of insulin [facility])   [facility], Devonne Doughty, DO   1 year ago Type 2 diabetes mellitus with other specified complication, without long-term current use of insulin [facility])   [place], DO   1 year ago Acute idiopathic gout of multiple sites   [place], Devonne Doughty, [place]

## 2020-03-15 ENCOUNTER — Other Ambulatory Visit: Payer: Self-pay | Admitting: Family Medicine

## 2020-03-15 DIAGNOSIS — I10 Essential (primary) hypertension: Secondary | ICD-10-CM

## 2020-03-30 ENCOUNTER — Other Ambulatory Visit: Payer: Self-pay | Admitting: Family Medicine

## 2020-03-30 DIAGNOSIS — J4520 Mild intermittent asthma, uncomplicated: Secondary | ICD-10-CM

## 2020-04-17 ENCOUNTER — Other Ambulatory Visit: Payer: Self-pay

## 2020-04-17 DIAGNOSIS — D3500 Benign neoplasm of unspecified adrenal gland: Secondary | ICD-10-CM

## 2020-04-17 DIAGNOSIS — I10 Essential (primary) hypertension: Secondary | ICD-10-CM

## 2020-04-17 DIAGNOSIS — E1169 Type 2 diabetes mellitus with other specified complication: Secondary | ICD-10-CM

## 2020-04-17 DIAGNOSIS — C73 Malignant neoplasm of thyroid gland: Secondary | ICD-10-CM

## 2020-04-17 DIAGNOSIS — Z0000 Encounter for general adult medical examination without abnormal findings: Secondary | ICD-10-CM

## 2020-04-17 DIAGNOSIS — E559 Vitamin D deficiency, unspecified: Secondary | ICD-10-CM

## 2020-04-17 DIAGNOSIS — E3122 Multiple endocrine neoplasia [MEN] type IIA: Secondary | ICD-10-CM

## 2020-04-21 ENCOUNTER — Other Ambulatory Visit: Payer: BC Managed Care – PPO

## 2020-04-25 ENCOUNTER — Encounter: Payer: BC Managed Care – PPO | Admitting: Family Medicine

## 2020-05-01 ENCOUNTER — Other Ambulatory Visit: Payer: Self-pay | Admitting: *Deleted

## 2020-05-01 ENCOUNTER — Encounter: Payer: Self-pay | Admitting: Urology

## 2020-05-01 MED ORDER — CLOMIPHENE CITRATE 50 MG PO TABS: 50.0000 mg | ORAL_TABLET | Freq: Every day | ORAL | 1 refills | Status: DC

## 2020-05-03 ENCOUNTER — Other Ambulatory Visit: Payer: Self-pay | Admitting: Family Medicine

## 2020-05-03 DIAGNOSIS — E1169 Type 2 diabetes mellitus with other specified complication: Secondary | ICD-10-CM

## 2020-05-18 ENCOUNTER — Telehealth: Payer: BC Managed Care – PPO | Admitting: Emergency Medicine

## 2020-05-18 DIAGNOSIS — H5789 Other specified disorders of eye and adnexa: Secondary | ICD-10-CM

## 2020-05-18 MED ORDER — POLYMYXIN B-TRIMETHOPRIM 10000-0.1 UNIT/ML-% OP SOLN: 1.0000 [drp] | OPHTHALMIC | 0 refills | Status: DC

## 2020-05-18 NOTE — Progress Notes (Signed)
E-Visit for Pink Eye   We are sorry that you are not feeling well.  Here is how we plan to help!  Based on what you have shared with me it looks like you have conjunctivitis.  Conjunctivitis is a common inflammatory or infectious condition of the eye that is often referred to as "pink eye".  In most cases it is contagious (viral or bacterial). However, not all conjunctivitis requires antibiotics (ex. Allergic).  We have made appropriate suggestions for you based upon your presentation.  I have prescribed Polytrim Ophthalmic drops 1-2 drops 4 times a day times 5 days  Pink eye can be highly contagious.  It is typically spread through direct contact with secretions, or contaminated objects or surfaces that one may have touched.  Strict handwashing is suggested with soap and water is urged.  If not available, use alcohol based had sanitizer.  Avoid unnecessary touching of the eye.  If you wear contact lenses, you will need to refrain from wearing them until you see no white discharge from the eye for at least 24 hours after being on medication.  You should see symptom improvement in 1-2 days after starting the medication regimen.  Call us if symptoms are not improved in 1-2 days.  Home Care:  Wash your hands often!  Do not wear your contacts until you complete your treatment plan.  Avoid sharing towels, bed linen, personal items with a person who has pink eye.  See attention for anyone in your home with similar symptoms.  Get Help Right Away If:  Your symptoms do not improve.  You develop blurred or loss of vision.  Your symptoms worsen (increased discharge, pain or redness)  Your e-visit answers were reviewed by a board certified advanced clinical practitioner to complete your personal care plan.  Depending on the condition, your plan could have included both over the counter or prescription medications.  If there is a problem please reply  once you have received a response from your  provider.  Your safety is important to us.  If you have drug allergies check your prescription carefully.    You can use MyChart to ask questions about today's visit, request a non-urgent call back, or ask for a work or school excuse for 24 hours related to this e-Visit. If it has been greater than 24 hours you will need to follow up with your provider, or enter a new e-Visit to address those concerns.   You will get an e-mail in the next two days asking about your experience.  I hope that your e-visit has been valuable and will speed your recovery. Thank you for using e-visits.     Approximately 5 minutes was used in reviewing the patient's chart, questionnaire, prescribing medications, and documentation.  

## 2020-05-23 ENCOUNTER — Other Ambulatory Visit: Payer: Self-pay

## 2020-05-23 ENCOUNTER — Encounter: Payer: Self-pay | Admitting: Family Medicine

## 2020-05-23 ENCOUNTER — Ambulatory Visit: Payer: BC Managed Care – PPO | Admitting: Family Medicine

## 2020-05-23 VITALS — BP 149/80 | HR 75 | Temp 98.4°F | Ht 75.0 in | Wt 308.4 lb

## 2020-05-23 DIAGNOSIS — H109 Unspecified conjunctivitis: Secondary | ICD-10-CM | POA: Diagnosis not present

## 2020-05-23 DIAGNOSIS — I10 Essential (primary) hypertension: Secondary | ICD-10-CM | POA: Diagnosis not present

## 2020-05-23 MED ORDER — ERYTHROMYCIN 5 MG/GM OP OINT: 1.0000 "application " | TOPICAL_OINTMENT | Freq: Every day | OPHTHALMIC | 0 refills | Status: DC

## 2020-05-23 MED ORDER — OFLOXACIN 0.3 % OP SOLN: 2.0000 [drp] | Freq: Four times a day (QID) | OPHTHALMIC | 0 refills | Status: DC

## 2020-05-23 NOTE — Patient Instructions (Signed)
I have sent in a prescription for ofloxacin drops to use 2 drops in the right eye 4x per day.  Be sure to wash your hands well before and after touching your face and don't allow the bottle tip to touch your eye/eyelid to prevent spreading the infection.  I have also sent in a prescription for erythromycin ointment to use at bedtime, if you are not having symptom improvement with the ofloxacin.  Try to get exercise a minimum of 30 minutes per day at least 5 days per week as well as  adequate water intake all while measuring blood pressure a few times per week.  Keep a blood pressure log and bring back to clinic at your next visit.  If your readings are consistently over 130/80 to contact our office/send me a MyChart message and we will see you sooner.  Can try DASH and Mediterranean diet options, avoiding processed foods, lowering sodium intake, avoiding pork products, and eating a plant based diet for optimal health.  Education and discussion with patient regarding hypertension as well as the effects on the organs and body.  Specifically, we spoke about kidney disease, kidney failure, heart attack, stroke and up to and including death, as likely outcomes if non-compliant with blood pressure regulation.  Discussed how all of these habits are attached to each other and each has the effect on each other.  We will plan to see you back for a follow up with Dr. Parks Ranger in the next 4-6 weeks for blood pressure re-evaluation  You will receive a survey after today's visit either digitally by e-mail or paper by USPS mail. Your experiences and feedback matter to [place].  Please respond so we know how we are doing as we provide care for you.  Call us with any questions/concerns/needs.  It is my goal to be available to you for your health concerns.  Thanks for choosing me to be a partner in your healthcare needs!  Harlin Rain, FNP-C Family Nurse Practitioner [facility] Phone: [phone]

## 2020-05-23 NOTE — Assessment & Plan Note (Signed)
Uncontrolled hypertension.  BP is not at goal < 130/80.  Pt is working on lifestyle modifications.  Taking medications tolerating well without side effects. Discussed adding in HCTZ to his treatment regimen since his HTN is uncontrolled.  Patient declines. Complications: Obesity, K1PT, HLD  Plan: 1. Continue taking lisinopril 20mg  daily 2. Obtain labs ordered by Dr. Hilbert Corrigan 3. Encouraged heart healthy diet and increasing exercise to 30 minutes most days of the week, going no more than 2 days in a row without exercise. 4. Check BP 1-2 x per week at home, keep log, and bring to clinic at next appointment. 5. Follow up 4-6 weeks.

## 2020-05-23 NOTE — Progress Notes (Signed)
Subjective:    Patient ID: Aaron Schwartz, male    DOB: Jan 29, 1981, 39 y.o.   MRN: [id number]  Aaron Schwartz is a 39 y.o. male presenting on 05/23/2020 for Conjunctivitis (pt state the symptoms started off with stye on the bottom of the right eyelid. He put a warm compress on the eye and the syte improved.Rt pink eye, itching, drainage x 6 days )   HPI  Aaron Schwartz presents to clinic for concerns of right eye bacterial conjunctivitis.  Reports it had started as a stye last week, had put a warm compress on and the style improved.  Shortly after that he started to have itching and draining with caking on eyelashes, approx 6 days ago.  Had completed an e visit through [facility] and was prescribed polytrim antibiotics, without improvement in his symptoms.  Denies vision changes, fevers, pain with eye movement, facial swelling.    Depression screen [facility] 2/9 12/06/2019 04/29/2019 02/17/2019  Decreased Interest 0 1 0  Down, Depressed, Hopeless 0 1 0  PHQ - 2 Score 0 2 0  Altered sleeping 0 1 -  Tired, decreased energy 0 0 -  Change in appetite 0 1 -  Feeling bad or failure about yourself  0 0 -  Trouble concentrating 0 0 -  Moving slowly or fidgety/restless 0 0 -  Suicidal thoughts 0 0 -  PHQ-9 Score 0 4 -  Difficult doing work/chores Not difficult at all Not difficult at all -    Social History   Tobacco Use  . Smoking status: Never Smoker  . Smokeless tobacco: Never Used  Vaping Use  . Vaping Use: Never used  Substance Use Topics  . Alcohol use: Yes    Comment: occasionally   . Drug use: No    Review of Systems  Constitutional: Negative.   HENT: Negative.   Eyes: Positive for discharge, redness and itching. Negative for photophobia, pain and visual disturbance.  Respiratory: Negative.   Cardiovascular: Negative.   Gastrointestinal: Negative.   Endocrine: Negative.   Genitourinary: Negative.   Musculoskeletal: Negative.   Skin: Negative.   Allergic/Immunologic: Negative.     Neurological: Negative.   Hematological: Negative.   Psychiatric/Behavioral: Negative.    Per HPI unless specifically indicated above     Objective:    BP (!) 149/80 (BP Location: Right Arm, Patient Position: Sitting, Cuff Size: Large)   Pulse 75   Temp 98.4 F (36.9 C) (Oral)   Ht 6\' 3"  (1.905 m)   Wt (!) 308 lb 6.4 oz (139.9 kg)   BMI 38.55 kg/m   Wt Readings from Last 3 Encounters:  05/23/20 (!) 308 lb 6.4 oz (139.9 kg)  12/08/19 (!) 305 lb (138.3 kg)  12/06/19 (!) 305 lb (138.3 kg)    Physical Exam Vitals reviewed.  Constitutional:      General: He is not in acute distress.    Appearance: Normal appearance. He is well-developed and well-groomed. He is obese. He is not ill-appearing or toxic-appearing.  HENT:     Head: Normocephalic and atraumatic.     Nose:     Comments: Lizbeth Bark is in place, covering mouth and nose. Eyes:     General: Vision grossly intact.        Right eye: Discharge present. No foreign body or hordeolum.        Left eye: No discharge.     Extraocular Movements: Extraocular movements intact.     Conjunctiva/sclera:     Right eye: Right conjunctiva  is injected. Exudate present. No chemosis or hemorrhage.    Pupils: Pupils are equal, round, and reactive to light.  Cardiovascular:     Pulses: Normal pulses.  Pulmonary:     Effort: Pulmonary effort is normal. No respiratory distress.  Musculoskeletal:     Right lower leg: No edema.     Left lower leg: No edema.  Skin:    General: Skin is warm and dry.     Capillary Refill: Capillary refill takes less than 2 seconds.  Neurological:     General: No focal deficit present.     Mental Status: He is alert and oriented to person, place, and time.  Psychiatric:        Attention and Perception: Attention and perception normal.        Mood and Affect: Mood and affect normal.        Speech: Speech normal.        Behavior: Behavior normal. Behavior is cooperative.        Thought Content: Thought  content normal.        Cognition and Memory: Cognition and memory normal.    Results for orders placed or performed in visit on 01/20/20  Hepatic function panel  Result Value Ref Range   Total Protein 7.4 6.0 - 8.5 g/dL   Albumin 4.4 4.0 - 5.0 g/dL   Bilirubin Total 0.4 0.0 - 1.2 mg/dL   Bilirubin, Direct 0.12 0.00 - 0.40 mg/dL   Alkaline Phosphatase 58 39 - 117 IU/L   AST 14 0 - 40 IU/L   ALT 10 0 - 44 IU/L  Testosterone  Result Value Ref Range   Testosterone 713 264 - 916 ng/dL      Assessment & Plan:   Problem List Items Addressed This Visit      Cardiovascular and Mediastinum   Hypertension    Uncontrolled hypertension.  BP is not at goal < 130/80.  Pt is working on lifestyle modifications.  Taking medications tolerating well without side effects. Discussed adding in HCTZ to his treatment regimen since his HTN is uncontrolled.  Patient declines. Complications: Obesity, D1SH, HLD  Plan: 1. Continue taking lisinopril 20mg  daily 2. Obtain labs ordered by Aaron Schwartz 3. Encouraged heart healthy diet and increasing exercise to 30 minutes most days of the week, going no more than 2 days in a row without exercise. 4. Check BP 1-2 x per week at home, keep log, and bring to clinic at next appointment. 5. Follow up 4-6 weeks.           Other   Bacterial conjunctivitis of right eye - Primary    Bacterial conjunctivitis of right eye, was seen with virtual e visit and prescribed polytrim eye drops that have not improved his symptoms.  Reviewed good hand hygiene and not allowing the bottle to touch his eye or eyelid to prevent cross contamination.  Will switch to ofloxacin eye drops 2 drops in right eye 4x per day.  Discussed can send in a prescription for erythromycin ointment to use at night to help resolve.   Plan: 1. Begin ofloxacin eye drops, 2 drops in right eye 4x per day 2. Can use erythromycin ointment nightly 3. Practice good hand hygiene and wash your pillow case  nightly until this has resolved 4. RTC if symptoms worsen or fail to improve      Relevant Medications   ofloxacin (OCUFLOX) 0.3 % ophthalmic solution   erythromycin ophthalmic ointment  Meds ordered this encounter  Medications  . ofloxacin (OCUFLOX) 0.3 % ophthalmic solution    Sig: Place 2 drops into the right eye 4 (four) times daily.    Dispense:  10 mL    Refill:  0  . erythromycin ophthalmic ointment    Sig: Place 1 application into the right eye at bedtime.    Dispense:  3.5 g    Refill:  0      Follow up plan: Return if symptoms worsen or fail to improve.   Harlin Rain, [place] Family Nurse Practitioner [facility] 05/23/2020, 4:14 PM

## 2020-05-23 NOTE — Assessment & Plan Note (Signed)
Bacterial conjunctivitis of right eye, was seen with virtual e visit and prescribed polytrim eye drops that have not improved his symptoms.  Reviewed good hand hygiene and not allowing the bottle to touch his eye or eyelid to prevent cross contamination.  Will switch to ofloxacin eye drops 2 drops in right eye 4x per day.  Discussed can send in a prescription for erythromycin ointment to use at night to help resolve.   Plan: 1. Begin ofloxacin eye drops, 2 drops in right eye 4x per day 2. Can use erythromycin ointment nightly 3. Practice good hand hygiene and wash your pillow case nightly until this has resolved 4. RTC if symptoms worsen or fail to improve

## 2020-06-13 ENCOUNTER — Other Ambulatory Visit: Payer: Self-pay | Admitting: Family Medicine

## 2020-06-13 DIAGNOSIS — I10 Essential (primary) hypertension: Secondary | ICD-10-CM

## 2020-06-13 NOTE — Telephone Encounter (Signed)
Requested medications are due for refill today?  Yes  Requested medications are on active medication list?  Yes  Last Refill:   03/15/2020  # 90 with one refill   Future visit scheduled?  Yes  Notes to Clinic:  Medication failed Rx refill protocol due to no valid labs within the past 180 days.  Last labs were performed on 08/04/2019.

## 2020-06-14 ENCOUNTER — Other Ambulatory Visit: Payer: Self-pay

## 2020-06-14 DIAGNOSIS — E291 Testicular hypofunction: Secondary | ICD-10-CM

## 2020-06-20 ENCOUNTER — Other Ambulatory Visit: Payer: Self-pay

## 2020-06-24 ENCOUNTER — Other Ambulatory Visit: Payer: Self-pay | Admitting: Family Medicine

## 2020-06-24 DIAGNOSIS — M1A09X0 Idiopathic chronic gout, multiple sites, without tophus (tophi): Secondary | ICD-10-CM

## 2020-06-24 NOTE — Telephone Encounter (Signed)
Requested Prescriptions  Pending Prescriptions Disp Refills  . allopurinol (ZYLOPRIM) 100 MG tablet [Pharmacy Med Name: ALLOPURINOL 100 MG TABLET] 180 tablet 0    Sig: TAKE 2 TABLETS BY MOUTH EVERY DAY     Endocrinology:  Gout Agents Failed - 06/24/2020 10:00 AM      Failed - Cr in normal range and within 360 days    Creat  Date Value Ref Range Status  04/26/2019 1.12 0.60 - 1.35 mg/dL Final   Creatinine, Ser  Date Value Ref Range Status  08/04/2019 1.31 (H) 0.76 - 1.27 mg/dL Final         Passed - Uric Acid in normal range and within 360 days    Uric Acid  Date Value Ref Range Status  08/04/2019 5.0 3.7 - 8.6 mg/dL Final    Comment:               Therapeutic target for gout patients: <6.0         Passed - Valid encounter within last 12 months    Recent Outpatient Visits          1 month ago Bacterial conjunctivitis of right eye   [facility], Lupita Raider, FNP   6 months ago Type 2 diabetes mellitus with other specified complication, without long-term current use of insulin ([place])   [facility], Devonne Doughty, DO   10 months ago Type 2 diabetes mellitus with other specified complication, without long-term current use of insulin ([place])   [facility], Devonne Doughty, DO   1 year ago Type 2 diabetes mellitus with other specified complication, without long-term current use of insulin [facility])   [facility], Devonne Doughty, DO   1 year ago Type 2 diabetes mellitus with other specified complication, without long-term current use of insulin ([place])   [facility], Devonne Doughty, DO      Future Appointments            In 2 months Parks Ranger, Devonne Doughty, DO [facility]

## 2020-07-04 ENCOUNTER — Other Ambulatory Visit: Payer: Self-pay

## 2020-07-04 ENCOUNTER — Encounter: Payer: Self-pay | Admitting: Urology

## 2020-07-12 ENCOUNTER — Other Ambulatory Visit: Payer: Self-pay | Admitting: Urology

## 2020-07-13 NOTE — Telephone Encounter (Signed)
Refilled for 1 month.  Need to r/s appt, cannot give additional refills until labs done

## 2020-07-14 MED ORDER — CLOMIPHENE CITRATE 50 MG PO TABS
50.0000 mg | ORAL_TABLET | Freq: Every day | ORAL | 0 refills | Status: DC
Start: 2020-07-14 — End: 2020-08-08

## 2020-07-14 NOTE — Addendum Note (Signed)
Addended by: Alvera Novel on: 07/14/2020 12:59 PM   Modules accepted: Orders

## 2020-07-25 ENCOUNTER — Encounter: Payer: Self-pay | Admitting: Urology

## 2020-08-08 ENCOUNTER — Telehealth: Payer: Self-pay | Admitting: *Deleted

## 2020-08-08 DIAGNOSIS — E291 Testicular hypofunction: Secondary | ICD-10-CM

## 2020-08-08 MED ORDER — CLOMIPHENE CITRATE 50 MG PO TABS: 50.0000 mg | ORAL_TABLET | Freq: Every day | ORAL | 0 refills | Status: DC

## 2020-08-08 NOTE — Telephone Encounter (Signed)
Talked with patient about clomid . He is going to [employer] and use the good rx coupon.

## 2020-08-13 ENCOUNTER — Telehealth: Payer: Self-pay | Admitting: Urology

## 2020-08-13 NOTE — Telephone Encounter (Signed)
Patient has not been seen since March 2021.  I approved the recent refill request for Clomid however he needs a follow-up office visit with PSA, hematocrit and testosterone.  We will not be able to give additional refills until he has a follow-up office visit.

## 2020-08-14 ENCOUNTER — Encounter: Payer: Self-pay | Admitting: *Deleted

## 2020-08-17 ENCOUNTER — Other Ambulatory Visit: Payer: Self-pay

## 2020-08-17 ENCOUNTER — Encounter: Payer: Self-pay | Admitting: Urology

## 2020-08-29 ENCOUNTER — Other Ambulatory Visit: Payer: Self-pay | Admitting: Family Medicine

## 2020-08-29 DIAGNOSIS — F419 Anxiety disorder, unspecified: Secondary | ICD-10-CM

## 2020-09-04 ENCOUNTER — Encounter: Payer: BC Managed Care – PPO | Admitting: Family Medicine

## 2020-09-12 ENCOUNTER — Other Ambulatory Visit: Payer: Self-pay | Admitting: Family Medicine

## 2020-09-12 DIAGNOSIS — I10 Essential (primary) hypertension: Secondary | ICD-10-CM

## 2020-09-18 ENCOUNTER — Ambulatory Visit: Payer: BC Managed Care – PPO | Admitting: Family Medicine

## 2020-09-18 ENCOUNTER — Encounter: Payer: Self-pay | Admitting: Family Medicine

## 2020-09-18 ENCOUNTER — Other Ambulatory Visit: Payer: Self-pay | Admitting: Family Medicine

## 2020-09-18 ENCOUNTER — Telehealth: Payer: Self-pay | Admitting: Urology

## 2020-09-18 ENCOUNTER — Other Ambulatory Visit: Payer: Self-pay

## 2020-09-18 VITALS — BP 136/76 | HR 76 | Temp 97.7°F | Resp 16 | Ht 75.0 in | Wt 309.0 lb

## 2020-09-18 DIAGNOSIS — E559 Vitamin D deficiency, unspecified: Secondary | ICD-10-CM

## 2020-09-18 DIAGNOSIS — M1A09X0 Idiopathic chronic gout, multiple sites, without tophus (tophi): Secondary | ICD-10-CM

## 2020-09-18 DIAGNOSIS — Z0000 Encounter for general adult medical examination without abnormal findings: Secondary | ICD-10-CM | POA: Diagnosis not present

## 2020-09-18 DIAGNOSIS — E785 Hyperlipidemia, unspecified: Secondary | ICD-10-CM

## 2020-09-18 DIAGNOSIS — F419 Anxiety disorder, unspecified: Secondary | ICD-10-CM

## 2020-09-18 DIAGNOSIS — D3500 Benign neoplasm of unspecified adrenal gland: Secondary | ICD-10-CM

## 2020-09-18 DIAGNOSIS — J0100 Acute maxillary sinusitis, unspecified: Secondary | ICD-10-CM

## 2020-09-18 DIAGNOSIS — I10 Essential (primary) hypertension: Secondary | ICD-10-CM

## 2020-09-18 DIAGNOSIS — C73 Malignant neoplasm of thyroid gland: Secondary | ICD-10-CM | POA: Diagnosis not present

## 2020-09-18 DIAGNOSIS — E1169 Type 2 diabetes mellitus with other specified complication: Secondary | ICD-10-CM

## 2020-09-18 DIAGNOSIS — E782 Mixed hyperlipidemia: Secondary | ICD-10-CM

## 2020-09-18 MED ORDER — ALLOPURINOL 100 MG PO TABS
200.0000 mg | ORAL_TABLET | Freq: Every day | ORAL | 3 refills | Status: DC
Start: 2020-09-18 — End: 2021-09-13

## 2020-09-18 MED ORDER — ESCITALOPRAM OXALATE 20 MG PO TABS: 20.0000 mg | ORAL_TABLET | Freq: Every day | ORAL | 3 refills | Status: DC

## 2020-09-18 MED ORDER — LISINOPRIL 20 MG PO TABS
20.0000 mg | ORAL_TABLET | Freq: Every day | ORAL | 3 refills | Status: DC
Start: 2020-09-18 — End: 2021-12-12

## 2020-09-18 MED ORDER — AMOXICILLIN-POT CLAVULANATE 875-125 MG PO TABS
1.0000 | ORAL_TABLET | Freq: Two times a day (BID) | ORAL | 0 refills | Status: DC
Start: 2020-09-18 — End: 2020-12-16

## 2020-09-18 MED ORDER — FARXIGA 10 MG PO TABS: 10.0000 mg | ORAL_TABLET | Freq: Every day | ORAL | 3 refills | Status: DC

## 2020-09-18 NOTE — Assessment & Plan Note (Signed)
Controlled on Escitalorpam 20mg  daily Off Fluoxetine

## 2020-09-18 NOTE — Telephone Encounter (Signed)
Pt scheduled lab appt for tomorrow 12/21.  He needs a refill for clomiPHENE (CLOMID) 50 MG tablet.

## 2020-09-18 NOTE — Assessment & Plan Note (Signed)
Improving weight loss with lifestyle diet exercise, now post-op from MEN2A likely thyroid was affecting his function

## 2020-09-18 NOTE — Assessment & Plan Note (Signed)
Without gout flare Uric acid down to 5.0 previously on allopurinol 200mg  daily, continue current dose  Check uric acid level

## 2020-09-18 NOTE — Assessment & Plan Note (Signed)
Due for A1c Improved A1c down to 7.3% now improved with lifestyle post op MEN2A thyroid/pheo Remains OFF Metformin Complications - hyperlipidemia inc risk of cardiovascular complication, obesity, gout OFF Metformin XR due to GI intolerance and concern w/ potential side effect  Plan:  1. Continue Farxiga 41m daily 2. Encourage improved lifestyle - low carb, low sugar diet, reduce portion size, continue improving regular exercise 3. Check CBG, bring log to next visit for review 4. [facility]

## 2020-09-18 NOTE — Progress Notes (Signed)
Subjective:    Patient ID: Aaron Schwartz, male    DOB: November 04, 1980, 39 y.o.   MRN: [id number]  Aaron Schwartz is a 39 y.o. male presenting on 09/18/2020 for Annual Exam   HPI   Here for Annual Physical and due for fasting labs.  CHRONIC DM, Type 2/ Morbid Obesity BMI >38 See HPI below post op from R thyroidectomy and R adrenalectomy - briefly, now Post-op, has had reduced appetite and decreased portion intake overall. He is resting better after thyroid surgery, has more energy He feels better overall Last A1c >7.3 Due for lab CBGs:None recently Meds:Farxiga 26m daily - tolerating well, staying hydrated. 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 He is waiting to return to ENT may benefit from steroid injection in vocal cords to help. [facility] levothyroxine 22429mdaily (x2 of the 112 dose)  Asthma Without flare up. He request refill on albuterol inhaler.  Acute sinusitis Recent congestion allergies onset now few weeks, progress to chest.  Health Maintenance: He has received Moderna vaccine x 2 already awaiting booster dose.  [facility] 09/18/2020 12/06/2019 04/29/2019  Decreased Interest 0 0 1  Down, Depressed, Hopeless 0 0 1  PHQ - 2 Score 0 0 2  Altered sleeping - 0 1  Tired, decreased energy - 0 0  Change in appetite - 0 1   Feeling bad or failure about yourself  - 0 0  Trouble concentrating - 0 0  Moving slowly or fidgety/restless - 0 0  Suicidal thoughts - 0 0  PHQ-9 Score - 0 4  Difficult doing work/chores - Not difficult at all Not difficult at all    Past Medical History:  Diagnosis Date  . Allergy   . Asthma   . Depression   . Hypertension    Past Surgical History:  Procedure Laterality Date  . none     Social History   Socioeconomic History  . Marital status: Married    Spouse name: Not on file  . Number of children: Not on file  . Years of education: Not on file  . Highest education level: Not on file  Occupational History  . Occupation: Football Coach    Comment: [employer]  Tobacco Use  . Smoking status: Never Smoker  . Smokeless tobacco: Never Used  Vaping Use  . Vaping Use: Never used  Substance and Sexual Activity  . Alcohol use: Yes    Comment: occasionally   . Drug use: No  . Sexual activity: Not on file  Other Topics Concern  . Not on file  Social History Narrative  . Not on file   Social Determinants of Health   Financial Resource Strain: Not on file  Food Insecurity: Not on file  Transportation Needs: Not on file  Physical Activity: Not on file  Stress: Not on file  Social Connections: Not on file  Intimate Partner Violence: Not on file   Family History  Problem Relation  Age of Onset  . Hypertension Mother   . Thyroid cancer Mother    Current Outpatient Medications on File Prior to Visit  Medication Sig  . albuterol (VENTOLIN HFA) 108 (90 Base) MCG/ACT inhaler TAKE 2 PUFFS BY MOUTH EVERY 6 HOURS AS NEEDED FOR WHEEZE OR SHORTNESS OF BREATH  . cetirizine (ZYRTEC) 10 MG tablet TAKE 1 TABLET BY MOUTH EVERY DAY  . clomiPHENE (CLOMID) 50 MG tablet Take 1 tablet (50 mg total) by mouth daily.  . fluocinonide cream (LIDEX) 2.99 % Apply 1 application topically 2 (two) times daily.  . fluticasone (FLONASE) 50 MCG/ACT nasal spray SPRAY 2 SPRAYS INTO EACH NOSTRIL  EVERY DAY  . ofloxacin (OCUFLOX) 0.3 % ophthalmic solution Place 2 drops into the right eye 4 (four) times daily.  Marland Kitchen TALTZ 80 MG/ML SOAJ Inject into the skin.  Marland Kitchen levothyroxine (SYNTHROID) 112 MCG tablet Take 2 tablets (224 mcg total) by mouth daily before breakfast.   No current facility-administered medications on file prior to visit.    Review of Systems  Constitutional: Negative for activity change, appetite change, chills, diaphoresis, fatigue and fever.  HENT: Positive for congestion, sinus pressure and sinus pain. Negative for hearing loss.   Eyes: Negative for visual disturbance.  Respiratory: Negative for cough, chest tightness, shortness of breath and wheezing.   Cardiovascular: Negative for chest pain, palpitations and leg swelling.  Gastrointestinal: Negative for abdominal pain, constipation, diarrhea, nausea and vomiting.  Genitourinary: Negative for dysuria, frequency and hematuria.  Musculoskeletal: Negative for arthralgias and neck pain.  Skin: Negative for rash.  Neurological: Negative for dizziness, weakness, light-headedness, numbness and headaches.  Hematological: Negative for adenopathy.  Psychiatric/Behavioral: Negative for behavioral problems, dysphoric mood and sleep disturbance.   Per HPI unless specifically indicated above      Objective:    BP 136/76   Pulse 76   Temp 97.7 F (36.5 C) (Temporal)   Resp 16   Ht 6' 3"  (1.905 m)   Wt (!) 309 lb (140.2 kg)   SpO2 96%   BMI 38.62 kg/m   Wt Readings from Last 3 Encounters:  09/18/20 (!) 309 lb (140.2 kg)  05/23/20 (!) 308 lb 6.4 oz (139.9 kg)  12/08/19 (!) 305 lb (138.3 kg)    Physical Exam Vitals and nursing note reviewed.  Constitutional:      General: He is not in acute distress.    Appearance: He is well-developed and well-nourished. He is not diaphoretic.     Comments: Well-appearing, comfortable, cooperative  HENT:     Head: Normocephalic and atraumatic.     Mouth/Throat:     Mouth:  Oropharynx is clear and moist.  Eyes:     General:        Right eye: No discharge.        Left eye: No discharge.     Extraocular Movements: EOM normal.     Conjunctiva/sclera: Conjunctivae normal.     Pupils: Pupils are equal, round, and reactive to light.  Neck:     Thyroid: No thyromegaly.  Cardiovascular:     Rate and Rhythm: Normal rate and regular rhythm.     Pulses: Intact distal pulses.     Heart sounds: Normal heart sounds. No murmur heard.   Pulmonary:     Effort: Pulmonary effort is normal. No respiratory distress.     Breath sounds: Normal breath sounds. No wheezing or rales.  Abdominal:     General: Bowel sounds are normal. There is no distension.  Palpations: Abdomen is soft. There is no mass.     Tenderness: There is no abdominal tenderness.  Musculoskeletal:        General: No tenderness or edema. Normal range of motion.     Cervical back: Normal range of motion and neck supple.     Right lower leg: No edema.     Left lower leg: No edema.     Comments: Upper / Lower Extremities: - Normal muscle tone, strength bilateral upper extremities 5/5, lower extremities 5/5  Lymphadenopathy:     Cervical: No cervical adenopathy.  Skin:    General: Skin is warm and dry.     Findings: No erythema or rash.  Neurological:     Mental Status: He is alert and oriented to person, place, and time.     Comments: Distal sensation intact to light touch all extremities  Psychiatric:        Mood and Affect: Mood and affect normal.        Behavior: Behavior normal.     Comments: Well groomed, good eye contact, normal speech and thoughts       Diabetic Foot Exam - Simple   Simple Foot Form Diabetic Foot exam was performed with the following findings: Yes 09/18/2020  1:44 PM  Visual Inspection No deformities, no ulcerations, no other skin breakdown bilaterally: Yes Sensation Testing Intact to touch and monofilament testing bilaterally: Yes Pulse Check Posterior Tibialis  and Dorsalis pulse intact bilaterally: Yes Comments     Results for orders placed or performed in visit on 01/20/20  Hepatic function panel  Result Value Ref Range   Total Protein 7.4 6.0 - 8.5 g/dL   Albumin 4.4 4.0 - 5.0 g/dL   Bilirubin Total 0.4 0.0 - 1.2 mg/dL   Bilirubin, Direct 0.12 0.00 - 0.40 mg/dL   Alkaline Phosphatase 58 39 - 117 IU/L   AST 14 0 - 40 IU/L   ALT 10 0 - 44 IU/L  Testosterone  Result Value Ref Range   Testosterone 713 264 - 916 ng/dL      Assessment & Plan:   Problem List Items Addressed This Visit    Vitamin D deficiency   Relevant Orders   VITAMIN D 25 Hydroxy (Vit-D Deficiency, Fractures)   Type 2 diabetes mellitus with other specified complication ([place])    Due for A1c Improved A1c down to 7.3% now improved with lifestyle post op MEN2A thyroid/pheo Remains OFF Metformin Complications - hyperlipidemia inc risk of cardiovascular complication, obesity, gout OFF Metformin XR due to GI intolerance and concern w/ potential side effect  Plan:  1. Continue Farxiga 75m daily 2. Encourage improved lifestyle - low carb, low sugar diet, reduce portion size, continue improving regular exercise 3. Check CBG, bring log to next visit for review 4. [facility]      Relevant Medications   FARXIGA 10 MG TABS tablet   lisinopril (ZESTRIL) 20 MG tablet   Other Relevant Orders   Hemoglobin A1c   Morbid obesity ([place]    Improving weight loss with lifestyle diet exercise, now post-op from MEN2A likely thyroid was affecting his function      Relevant Medications   FARXIGA 10 MG TABS tablet   Other Relevant Orders   CBC with Differential/Platelet   Basic Metabolic Panel (BMET)   Medullary thyroid carcinoma and pheochromocytoma (HCC)   Relevant Medications   amoxicillin-clavulanate (AUGMENTIN) 875-125 MG tablet   allopurinol (ZYLOPRIM) 100 MG tablet  levothyroxine (SYNTHROID) 112 MCG tablet   Other Relevant Orders    Basic Metabolic Panel (BMET)   Idiopathic chronic gout of multiple sites without tophus    Without gout flare Uric acid down to 5.0 previously on allopurinol 217m daily, continue current dose  Check uric acid level      Relevant Medications   allopurinol (ZYLOPRIM) 100 MG tablet   Other Relevant Orders   Uric acid   Hyperlipidemia associated with type 2 diabetes mellitus ([place]    Due for fasting lipid Not on statin therapy      Relevant Medications   FARXIGA 10 MG TABS tablet   lisinopril (ZESTRIL) 20 MG tablet   Anxiety    Controlled on Escitalorpam 243mdaily Off Fluoxetine      Relevant Medications   escitalopram (LEXAPRO) 20 MG tablet    Other Visit Diagnoses    Annual physical exam    -  Primary   Relevant Orders   Hemoglobin A1c   Lipid panel   CBC with Differential/Platelet   Basic Metabolic Panel (BMET)   Essential hypertension       Relevant Medications   lisinopril (ZESTRIL) 20 MG tablet   Other Relevant Orders   CBC with Differential/Platelet   Basic Metabolic Panel (BMET)   Magnesium   Acute non-recurrent maxillary sinusitis       Relevant Medications   amoxicillin-clavulanate (AUGMENTIN) 875-125 MG tablet      Updated Health Maintenance information Recommend Booster for COVID Order labs [facility] he will get drawn tomorrow Encouraged improvement to lifestyle with diet and exercise - Goal of weight loss   Meds ordered this encounter  Medications  . amoxicillin-clavulanate (AUGMENTIN) 875-125 MG tablet    Sig: Take 1 tablet by mouth 2 (two) times daily.    Dispense:  20 tablet    Refill:  0  . allopurinol (ZYLOPRIM) 100 MG tablet    Sig: Take 2 tablets (200 mg total) by mouth daily.    Dispense:  180 tablet    Refill:  3  . escitalopram (LEXAPRO) 20 MG tablet    Sig: Take 1 tablet (20 mg total) by mouth daily.    Dispense:  90 tablet    Refill:  3  . FARXIGA 10 MG TABS tablet    Sig: Take 1 tablet (10 mg total) by mouth daily before  breakfast.    Dispense:  90 tablet    Refill:  3  . lisinopril (ZESTRIL) 20 MG tablet    Sig: Take 1 tablet (20 mg total) by mouth daily.    Dispense:  90 tablet    Refill:  3      Follow up plan: No follow-ups on file.   [facility] orders printed. He will go tomorrow.  AlNobie PutnamDOApplingedical Group 09/18/2020, 1:34 PM

## 2020-09-18 NOTE — Telephone Encounter (Signed)
Requested medication (s) are due for refill today:   Yes  Requested medication (s) are on the active medication list:   Yes  Future visit scheduled:   Yes today (12/20) at 1:20.   Address during OV?   Last ordered: Returned due to [place] scheduled for today Monday   Requested Prescriptions  Pending Prescriptions Disp Refills   allopurinol (ZYLOPRIM) 100 MG tablet [Pharmacy Med Name: ALLOPURINOL 100 MG TABLET] 180 tablet 0    Sig: TAKE 2 [place]      Endocrinology:  Gout Agents Failed - 09/18/2020  1:34 AM      Failed - Uric Acid in normal range and within 360 days    Uric Acid  Date Value Ref Range Status  08/04/2019 5.0 3.7 - 8.6 mg/dL Final    Comment:               Therapeutic target for gout patients: <6.0          Failed - Cr in normal range and within 360 days    Creat  Date Value Ref Range Status  04/26/2019 1.12 0.60 - 1.35 mg/dL Final   Creatinine, Ser  Date Value Ref Range Status  08/04/2019 1.31 (H) 0.76 - 1.27 mg/dL Final          Passed - Valid encounter within last 12 months    Recent Outpatient Visits           3 months ago Bacterial conjunctivitis of right eye   [facility], Lupita Raider, FNP   9 months ago Type 2 diabetes mellitus with other specified complication, without long-term current use of insulin ([place])   [facility], Devonne Doughty, DO   1 year ago Type 2 diabetes mellitus with other specified complication, without long-term current use of insulin ([place])   [facility], Devonne Doughty, DO   1 year ago Type 2 diabetes mellitus with other specified complication, without long-term current use of insulin [facility])   [facility] [place], Devonne Doughty, DO   1 year ago Type 2 diabetes mellitus with other specified complication, without long-term current use of insulin ([place])   [facility], Devonne Doughty, DO       Future  Appointments             Today [place] Devonne Doughty, DO [facility], [facility]

## 2020-09-18 NOTE — Patient Instructions (Addendum)
Thank you for coming to the office today.  Recommend DM Eye Exam once yearly.  Foot exam today  I do strongly recommend COVID Moderna Booster vaccine at pharmacy [facility], can try walk in or call ahead or schedule online  [facility] orders printed  meds refilled.   Please schedule a Follow-up Appointment to: Return in about 6 months (around 03/19/2021) for 6 month Annual Physical - print [facility] orders after visit.  If you have any other questions or concerns, please feel free to call the office or send a message through [place]. You may also schedule an earlier appointment if necessary.  Additionally, you may be receiving a survey about your experience at our office within a few days to 1 week by e-mail or mail. We value your feedback.  Nobie Putnam, DO [place]

## 2020-09-18 NOTE — Assessment & Plan Note (Signed)
Due for fasting lipid Not on statin therapy

## 2020-09-19 ENCOUNTER — Other Ambulatory Visit: Payer: BC Managed Care – PPO

## 2020-09-19 DIAGNOSIS — E291 Testicular hypofunction: Secondary | ICD-10-CM

## 2020-09-20 ENCOUNTER — Encounter: Payer: BC Managed Care – PPO | Admitting: Family Medicine

## 2020-09-20 LAB — CBC WITH DIFFERENTIAL/PLATELET
Basophils Absolute: 0 10*3/uL (ref 0.0–0.2)
Basos: 1 %
EOS (ABSOLUTE): 0.2 10*3/uL (ref 0.0–0.4)
Eos: 5 %
Hematocrit: 47.7 % (ref 37.5–51.0)
Hemoglobin: 15.7 g/dL (ref 13.0–17.7)
Immature Grans (Abs): 0 10*3/uL (ref 0.0–0.1)
Immature Granulocytes: 0 %
Lymphocytes Absolute: 1.6 10*3/uL (ref 0.7–3.1)
Lymphs: 40 %
MCH: 29.1 pg (ref 26.6–33.0)
MCHC: 32.9 g/dL (ref 31.5–35.7)
MCV: 89 fL (ref 79–97)
Monocytes Absolute: 0.5 10*3/uL (ref 0.1–0.9)
Monocytes: 11 %
Neutrophils Absolute: 1.8 10*3/uL (ref 1.4–7.0)
Neutrophils: 43 %
Platelets: 322 10*3/uL (ref 150–450)
RBC: 5.39 x10E6/uL (ref 4.14–5.80)
RDW: 13.3 % (ref 11.6–15.4)
WBC: 4.2 10*3/uL (ref 3.4–10.8)

## 2020-09-20 LAB — HEPATIC FUNCTION PANEL
ALT: 13 IU/L (ref 0–44)
AST: 17 IU/L (ref 0–40)
Albumin: 4.5 g/dL (ref 4.0–5.0)
Alkaline Phosphatase: 59 IU/L (ref 44–121)
Bilirubin Total: 0.3 mg/dL (ref 0.0–1.2)
Bilirubin, Direct: 0.11 mg/dL (ref 0.00–0.40)
Total Protein: 7.6 g/dL (ref 6.0–8.5)

## 2020-09-20 LAB — BASIC METABOLIC PANEL
BUN/Creatinine Ratio: 14 (ref 9–20)
BUN: 19 mg/dL (ref 6–24)
CO2: 22 mmol/L (ref 20–29)
Calcium: 8.7 mg/dL (ref 8.7–10.2)
Chloride: 99 mmol/L (ref 96–106)
Creatinine, Ser: 1.37 mg/dL — ABNORMAL HIGH (ref 0.76–1.27)
GFR calc Af Amer: 74 mL/min/{1.73_m2} (ref >59)
GFR calc non Af Amer: 64 mL/min/{1.73_m2} (ref >59)
Glucose: 135 mg/dL — ABNORMAL HIGH (ref 65–99)
Potassium: 4.9 mmol/L (ref 3.5–5.2)
Sodium: 137 mmol/L (ref 134–144)

## 2020-09-20 LAB — MAGNESIUM: Magnesium: 2 mg/dL (ref 1.6–2.3)

## 2020-09-20 LAB — TESTOSTERONE: Testosterone: 561 ng/dL (ref 264–916)

## 2020-09-20 LAB — LIPID PANEL
Chol/HDL Ratio: 5.5 ratio — ABNORMAL HIGH (ref 0.0–5.0)
Cholesterol, Total: 232 mg/dL — ABNORMAL HIGH (ref 100–199)
HDL: 42 mg/dL (ref >39)
LDL Chol Calc (NIH): 152 mg/dL — ABNORMAL HIGH (ref 0–99)
Triglycerides: 209 mg/dL — ABNORMAL HIGH (ref 0–149)
VLDL Cholesterol Cal: 38 mg/dL (ref 5–40)

## 2020-09-20 LAB — HEMOGLOBIN A1C
Est. average glucose Bld gHb Est-mCnc: 169 mg/dL
Hgb A1c MFr Bld: 7.5 % — ABNORMAL HIGH (ref 4.8–5.6)

## 2020-09-20 LAB — HEMATOCRIT: Hematocrit: 46.9 % (ref 37.5–51.0)

## 2020-09-20 LAB — URIC ACID: Uric Acid: 5.9 mg/dL (ref 3.8–8.4)

## 2020-09-20 LAB — VITAMIN D 25 HYDROXY (VIT D DEFICIENCY, FRACTURES): Vit D, 25-Hydroxy: 23.9 ng/mL — ABNORMAL LOW (ref 30.0–100.0)

## 2020-09-20 LAB — PSA: Prostate Specific Ag, Serum: 0.5 ng/mL (ref 0.0–4.0)

## 2020-09-23 ENCOUNTER — Other Ambulatory Visit: Payer: Self-pay | Admitting: Urology

## 2020-09-23 DIAGNOSIS — E291 Testicular hypofunction: Secondary | ICD-10-CM

## 2020-09-25 ENCOUNTER — Encounter: Payer: Self-pay | Admitting: *Deleted

## 2020-09-25 MED ORDER — CLOMIPHENE CITRATE 50 MG PO TABS: 50.0000 mg | ORAL_TABLET | Freq: Every day | ORAL | 0 refills | Status: DC

## 2020-10-27 ENCOUNTER — Encounter: Payer: Self-pay | Admitting: Urology

## 2020-10-30 ENCOUNTER — Other Ambulatory Visit: Payer: Self-pay | Admitting: *Deleted

## 2020-10-30 DIAGNOSIS — E291 Testicular hypofunction: Secondary | ICD-10-CM

## 2020-10-30 MED ORDER — CLOMIPHENE CITRATE 50 MG PO TABS: 50.0000 mg | ORAL_TABLET | Freq: Every day | ORAL | 1 refills | Status: DC

## 2020-11-12 LAB — COMPREHENSIVE METABOLIC PANEL: Calcium: 9.1 (ref 8.7–10.7)

## 2020-11-12 LAB — CBC AND DIFFERENTIAL
HCT: 48 (ref 41–53)
Hemoglobin: 15.4 (ref 13.5–17.5)
Neutrophils Absolute: 1.4
Platelets: 337 (ref 150–399)
WBC: 4

## 2020-11-12 LAB — BASIC METABOLIC PANEL
BUN: 16 (ref 4–21)
CO2: 21 (ref 13–22)
Chloride: 100 (ref 99–108)
Creatinine: 1.4 — AB (ref 0.6–1.3)
Glucose: 132
Potassium: 5.4 — AB (ref 3.4–5.3)
Sodium: 138 (ref 137–147)

## 2020-11-12 LAB — CBC: RBC: 5.26 — AB (ref 3.87–5.11)

## 2020-11-16 ENCOUNTER — Encounter: Payer: Self-pay | Admitting: Family Medicine

## 2020-12-05 ENCOUNTER — Encounter: Payer: Self-pay | Admitting: Urology

## 2020-12-05 ENCOUNTER — Other Ambulatory Visit: Payer: Self-pay | Admitting: *Deleted

## 2020-12-05 DIAGNOSIS — E291 Testicular hypofunction: Secondary | ICD-10-CM

## 2020-12-05 MED ORDER — CLOMIPHENE CITRATE 50 MG PO TABS: 50.0000 mg | ORAL_TABLET | Freq: Every day | ORAL | 0 refills | Status: DC

## 2020-12-14 ENCOUNTER — Ambulatory Visit: Payer: BC Managed Care – PPO | Admitting: Urology

## 2020-12-14 ENCOUNTER — Encounter: Payer: Self-pay | Admitting: Urology

## 2020-12-14 ENCOUNTER — Other Ambulatory Visit: Payer: Self-pay

## 2020-12-14 VITALS — BP 138/70 | HR 68 | Ht 75.0 in | Wt 310.0 lb

## 2020-12-14 DIAGNOSIS — E291 Testicular hypofunction: Secondary | ICD-10-CM

## 2020-12-15 ENCOUNTER — Ambulatory Visit: Payer: Self-pay | Admitting: Urology

## 2020-12-16 ENCOUNTER — Encounter: Payer: Self-pay | Admitting: Urology

## 2020-12-16 MED ORDER — XYOSTED 75 MG/0.5ML ~~LOC~~ SOAJ: 75.0000 mg | SUBCUTANEOUS | 3 refills | Status: DC

## 2020-12-16 NOTE — Progress Notes (Signed)
12/14/2020 10:41 AM   Aaron Schwartz [birth year] [id number]  Referring provider: Olin Hauser, DO [address] [place],  [place] [address]  Chief Complaint  Patient presents with  . Hypogonadism    HPI: 40 y.o. male presents for follow-up of hypogonadism.   Symptoms tiredness, fatigue, low energy and decreased libido  Initially elected Clomid started 06/2019  Improvement in testosterone levels however symptoms have not improved; testosterone level 08/2020 was 561; HCT 10/2020 47.7  Desires a trial of TRT   PMH: Past Medical History:  Diagnosis Date  . Allergy   . Asthma   . Depression   . Hypertension     Surgical History: Past Surgical History:  Procedure Laterality Date  . none      Home Medications:  Allergies as of 12/14/2020      Reactions   Gramineae Pollens    Mixed Grasses       Medication List       Accurate as of December 14, 2020 11:59 PM. If you have any questions, ask your nurse or doctor.        albuterol 108 (90 Base) MCG/ACT inhaler Commonly known as: VENTOLIN HFA TAKE 2 PUFFS BY MOUTH EVERY 6 HOURS AS NEEDED FOR WHEEZE OR SHORTNESS OF BREATH   allopurinol 100 MG tablet Commonly known as: ZYLOPRIM Take 2 tablets (200 mg total) by mouth daily.   amoxicillin-clavulanate 875-125 MG tablet Commonly known as: AUGMENTIN Take 1 tablet by mouth 2 (two) times daily.   cetirizine 10 MG tablet Commonly known as: ZYRTEC TAKE 1 TABLET BY MOUTH EVERY DAY   clomiPHENE 50 MG tablet Commonly known as: CLOMID Take 1 tablet (50 mg total) by mouth daily.   escitalopram 20 MG tablet Commonly known as: LEXAPRO Take 1 tablet (20 mg total) by mouth daily.   Farxiga 10 MG Tabs tablet Generic drug: dapagliflozin propanediol Take 1 tablet (10 mg total) by mouth daily before breakfast.   fluocinonide cream 0.05 % Commonly known as: LIDEX Apply 1 application topically 2 (two) times daily.   fluticasone 50 MCG/ACT nasal spray Commonly known  as: FLONASE SPRAY 2 SPRAYS INTO EACH NOSTRIL EVERY DAY   levothyroxine 112 MCG tablet Commonly known as: SYNTHROID Take 2 tablets (224 mcg total) by mouth daily before breakfast.   lisinopril 20 MG tablet Commonly known as: ZESTRIL Take 1 tablet (20 mg total) by mouth daily.   ofloxacin 0.3 % ophthalmic solution Commonly known as: OCUFLOX Place 2 drops into the right eye 4 (four) times daily.   Taltz 80 MG/ML Soaj Generic drug: Ixekizumab Inject into the skin.       Allergies:  Allergies  Allergen Reactions  . Gramineae Pollens   . Mixed Grasses     Family History: Family History  Problem Relation Age of Onset  . Hypertension Mother   . Thyroid cancer Mother     Social History:  reports that he has never smoked. He has never used smokeless tobacco. He reports current alcohol use. He reports that he does not use drugs.   Physical Exam: BP 138/70   Pulse 68   Ht 6\' 3"  (1.905 m)   Wt (!) 310 lb (140.6 kg)   BMI 38.75 kg/m   Constitutional:  Alert and oriented, No acute distress. HEENT: [place] AT, moist mucus membranes.  Trachea midline, no masses. Cardiovascular: No clubbing, cyanosis, or edema. Respiratory: Normal respiratory effort, no increased work of breathing.   Assessment & Plan:    1.  Hypogonadism  Desires a trial of TRT  Delivery methods were discussed including topicals, IM testosterone cypionate and subq testosterone enanthate.  Recent approval of oral testosterone was discussed however he was informed insurance coverage is poor  Would like to see if testosterone enanthate Berdine Addison) will be covered by insurance and Rx sent to pharmacy  Potential side effects of testosterone replacement were discussed including stimulation of benign prostatic growth with lower urinary tract symptoms; erythrocytosis; edema; gynecomastia; worsening sleep apnea; venous thromboembolism; testicular atrophy and infertility. Recent studies suggesting an increased incidence  of heart attack and stroke in patients taking testosterone was discussed. He was informed there is conflicting evidence regarding the impact of testosterone therapy on cardiovascular risk. The theoretical risk of growth stimulation of an undetected prostate cancer was also discussed.  He was informed that current evidence does not provide any definitive answers regarding the risks of testosterone therapy on prostate cancer and cardiovascular disease. The need for periodic monitoring of his testosterone level, PSA, hematocrit and DRE was discussed.   Abbie Sons, [place] [address], [place], [place] [address] [phone]

## 2021-01-04 ENCOUNTER — Encounter: Payer: Self-pay | Admitting: Urology

## 2021-01-07 ENCOUNTER — Telehealth: Payer: Self-pay | Admitting: Urology

## 2021-01-07 MED ORDER — TESTOSTERONE 20.25 MG/ACT (1.62%) TD GEL: TRANSDERMAL | 2 refills | Status: DC

## 2021-01-07 NOTE — Telephone Encounter (Signed)
rx sent. Needs T level ~ 6 weeks

## 2021-01-09 ENCOUNTER — Encounter: Payer: Self-pay | Admitting: *Deleted

## 2021-01-16 ENCOUNTER — Other Ambulatory Visit: Payer: Self-pay | Admitting: *Deleted

## 2021-01-16 DIAGNOSIS — E291 Testicular hypofunction: Secondary | ICD-10-CM

## 2021-02-02 ENCOUNTER — Other Ambulatory Visit: Payer: Self-pay | Admitting: Family Medicine

## 2021-02-02 DIAGNOSIS — F419 Anxiety disorder, unspecified: Secondary | ICD-10-CM

## 2021-02-08 ENCOUNTER — Telehealth: Payer: BC Managed Care – PPO | Admitting: Physician Assistant

## 2021-02-08 DIAGNOSIS — H109 Unspecified conjunctivitis: Secondary | ICD-10-CM

## 2021-02-08 MED ORDER — OFLOXACIN 0.3 % OP SOLN: 2.0000 [drp] | Freq: Four times a day (QID) | OPHTHALMIC | 0 refills | Status: DC

## 2021-02-08 NOTE — Progress Notes (Signed)
If you are already using antibiotic eye drops and ointment, there is not much we can do through this platform. I would recommend to seek in-person evaluation, particularly with an ophthalmologist (eye doctor).   Based on what you shared with me, I feel your condition warrants further evaluation and I recommend that you be seen in a face to face office visit.  NOTE: If you entered your credit card information for this eVisit, you will not be charged. You may see a "hold" on your card for the $35 but that hold will drop off and you will not have a charge processed.  If you are having a true medical emergency please call 911.  For an urgent face to face visit, [place] has six urgent care centers for your convenience:  [facility] [place] [facility] [place] Get Driving Directions [phone] [place], [place] [address] 8 am - 4 pm Monday - Friday  [facility] Get Driving Directions [phone] [place], [place] [address] 8 am to 8 pm Monday-Friday 10 am to 6 pm [facility]) Get Driving Directions [phone]  [address] [place] 8 am to 8 pm Monday-Friday 8 am to 4 pm [facility] Get Driving Directions [phone] [place], [place], [place] [address] 8 am to 8 pm Monday-Friday 8 am to 4 pm [facility] Get Driving Directions [phone] [address] [place], [place] [address] 8 am to 8 pm Monday-Friday 8 am to 4 pm [facility] at [place] Get Driving Directions [phone] [address] [place], [place] [address] 8 am to 8 pm Monday-Friday 8 am to 4 pm Saturday-Sunday    Your MyChart E-visit  questionnaire answers were reviewed by a board certified advanced clinical practitioner to complete your personal care plan based on your specific symptoms. Thank you for using e-Visits.  I provided 6 minutes of non face-to-face time during this encounter for chart review and documentation.

## 2021-02-08 NOTE — Addendum Note (Signed)
Addended by: Mar Daring on: 02/08/2021 03:17 PM   Modules accepted: Orders

## 2021-02-21 ENCOUNTER — Other Ambulatory Visit: Payer: BC Managed Care – PPO

## 2021-02-21 ENCOUNTER — Other Ambulatory Visit: Payer: Self-pay

## 2021-02-21 DIAGNOSIS — E291 Testicular hypofunction: Secondary | ICD-10-CM

## 2021-02-22 ENCOUNTER — Telehealth: Payer: Self-pay | Admitting: Family Medicine

## 2021-02-22 ENCOUNTER — Encounter: Payer: Self-pay | Admitting: *Deleted

## 2021-02-22 LAB — TESTOSTERONE: Testosterone: 465 ng/dL (ref 264–916)

## 2021-02-22 NOTE — Telephone Encounter (Signed)
Patient notified Testosterone has been approved Approval dates: 02/21/2021-02/22/2024

## 2021-04-23 ENCOUNTER — Encounter: Payer: Self-pay | Admitting: Podiatry

## 2021-04-23 ENCOUNTER — Ambulatory Visit (INDEPENDENT_AMBULATORY_CARE_PROVIDER_SITE_OTHER): Payer: BC Managed Care – PPO

## 2021-04-23 ENCOUNTER — Other Ambulatory Visit: Payer: Self-pay

## 2021-04-23 ENCOUNTER — Ambulatory Visit: Payer: BC Managed Care – PPO | Admitting: Podiatry

## 2021-04-23 DIAGNOSIS — M2142 Flat foot [pes planus] (acquired), left foot: Secondary | ICD-10-CM

## 2021-04-23 DIAGNOSIS — M2141 Flat foot [pes planus] (acquired), right foot: Secondary | ICD-10-CM | POA: Diagnosis not present

## 2021-04-23 DIAGNOSIS — M722 Plantar fascial fibromatosis: Secondary | ICD-10-CM

## 2021-04-23 DIAGNOSIS — M216X2 Other acquired deformities of left foot: Secondary | ICD-10-CM | POA: Diagnosis not present

## 2021-04-23 DIAGNOSIS — M21862 Other specified acquired deformities of left lower leg: Secondary | ICD-10-CM

## 2021-04-23 NOTE — Patient Instructions (Signed)

## 2021-04-23 NOTE — Progress Notes (Signed)
  Subjective:  Patient ID: Aaron Schwartz, male    DOB: 15-Jun-1981,  MRN: [id number]  Chief Complaint  Patient presents with   Plantar Fasciitis      (xrays)NP/ I THINK I HAVE PLANTAR FASCIITIS ON THE LEFT FOOT/ NREQ    40 y.o. male presents with the above complaint. History confirmed with patient.  Started around April he is a high [employer] he notes the pain has gotten progressively worse over the summer especially through football camp.  Objective:  Physical Exam: warm, good capillary refill, no trophic changes or ulcerative lesions, normal DP and PT pulses, and normal sensory exam. Left Foot: Sharp pain on palpation of the plantar calcaneal tubercle of the plantar fascia and on the plantar heel pad.  Gastroc equinus a positive Silfverskiold test  Radiographs: Multiple views x-ray of the right foot: He has pes planus deformity, no plantar heel spur there is a posterior calcaneal spur Assessment:   1. Plantar fasciitis of left foot   2. Pes planus of both feet   3. Gastrocnemius equinus of left lower extremity      Plan:  Patient was evaluated and treated and all questions answered.  Discussed the etiology and treatment options for plantar fasciitis including stretching, formal physical therapy, supportive shoegears such as a running shoe or sneaker, pre fabricated orthoses, injection therapy, and oral medications. We also discussed the role of surgical treatment of this for patients who do not improve after exhausting non-surgical treatment options.   -XR reviewed with patient -Educated patient on stretching and icing of the affected limb -Plantar fascial brace dispensed -Injection delivered to the plantar fascia of the right foot. -Reviewed his most recent lab work he did have some elevation in his creatinine so I recommended OTC Tylenol and NSAIDs as needed instead of placing him on a standing dose of NSAID right now -We also discussed  the role of custom molded orthoses and likely will plan to casted for these at his next visit if he is improving  After sterile prep with povidone-iodine solution and alcohol, the left heel was injected with 0.5cc 2% xylocaine plain, 0.5cc 0.5% marcaine plain, '5mg'$  triamcinolone acetonide, and '2mg'$  dexamethasone was injected along the medial plantar fascia at the insertion on the plantar calcaneus. The patient tolerated the procedure well without complication.      Return in about 1 month (around 05/24/2021) for recheck plantar fasciitis.

## 2021-04-30 ENCOUNTER — Other Ambulatory Visit: Payer: Self-pay | Admitting: "Endocrinology

## 2021-04-30 ENCOUNTER — Other Ambulatory Visit (HOSPITAL_COMMUNITY): Payer: Self-pay | Admitting: "Endocrinology

## 2021-04-30 DIAGNOSIS — C73 Malignant neoplasm of thyroid gland: Secondary | ICD-10-CM

## 2021-04-30 DIAGNOSIS — C801 Malignant (primary) neoplasm, unspecified: Secondary | ICD-10-CM

## 2021-05-08 ENCOUNTER — Other Ambulatory Visit: Payer: Self-pay | Admitting: Family Medicine

## 2021-05-08 DIAGNOSIS — J4520 Mild intermittent asthma, uncomplicated: Secondary | ICD-10-CM

## 2021-05-08 DIAGNOSIS — J209 Acute bronchitis, unspecified: Secondary | ICD-10-CM

## 2021-05-08 DIAGNOSIS — J302 Other seasonal allergic rhinitis: Secondary | ICD-10-CM

## 2021-05-08 MED ORDER — FLUTICASONE PROPIONATE 50 MCG/ACT NA SUSP: NASAL | 1 refills | Status: DC

## 2021-05-08 MED ORDER — ALBUTEROL SULFATE HFA 108 (90 BASE) MCG/ACT IN AERS: INHALATION_SPRAY | RESPIRATORY_TRACT | 4 refills | Status: DC

## 2021-05-08 NOTE — Telephone Encounter (Signed)
Requested Prescriptions  Pending Prescriptions Disp Refills  . albuterol (VENTOLIN HFA) 108 (90 Base) MCG/ACT inhaler 8.5 g 4    Sig: TAKE 2 PUFFS BY MOUTH EVERY 6 HOURS AS NEEDED FOR WHEEZE OR SHORTNESS OF BREATH     Pulmonology:  Beta Agonists Failed - 05/08/2021  4:25 PM      Failed - One inhaler should last at least one month. If the patient is requesting refills earlier, contact the patient to check for uncontrolled symptoms.      Passed - Valid encounter within last 12 months    Recent Outpatient Visits          7 months ago Annual physical exam   [facility] Olin Hauser, DO   11 months ago Bacterial conjunctivitis of right eye   [facility], Lupita Raider, FNP   1 year ago Type 2 diabetes mellitus with other specified complication, without long-term current use of insulin ([place])   [facility], Devonne Doughty, DO   1 year ago Type 2 diabetes mellitus with other specified complication, without long-term current use of insulin ([place])   [facility], Devonne Doughty, DO   2 years ago Type 2 diabetes mellitus with other specified complication, without long-term current use of insulin ([place])   [facility], Devonne Doughty, DO             . fluticasone (FLONASE) 50 MCG/ACT nasal spray 48 mL 1    Sig: SPRAY 2 SPRAYS INTO EACH NOSTRIL EVERY DAY     Ear, Nose, and Throat: Nasal Preparations - Corticosteroids Passed - 05/08/2021  4:25 PM      Passed - Valid encounter within last 12 months    Recent Outpatient Visits          7 months ago Annual physical exam   [facility] Olin Hauser, DO   11 months ago Bacterial conjunctivitis of right eye   [facility], Lupita Raider, FNP   1 year ago Type 2 diabetes mellitus with other specified complication, without long-term current use of insulin [facility])   [facility], Devonne Doughty, DO   1 year ago Type 2 diabetes mellitus with other specified complication, without long-term current use of insulin [facility])   [facility], Devonne Doughty, DO   2 years ago Type 2 diabetes mellitus with other specified complication, without long-term current use of insulin [facility])   [place], Devonne Doughty, DO

## 2021-05-08 NOTE — Telephone Encounter (Signed)
Medication Refill - Medication: albuterol (VENTOLIN HFA) 108 (90 Base) MCG/ACT inhaler   fluticasone (FLONASE) 50 MCG/ACT nasal spray   Has the patient contacted their pharmacy? Yes.   (Agent: If no, request that the patient contact the pharmacy for the refill.) (Agent: If yes, when and what did the pharmacy advise?)pharmacy advised pt to call pcp due to expired Rx   Preferred Pharmacy (with phone number or street name):  [id number] [address] Phone:  [phone] Fax:  [phone]     Agent: Please be advised that RX refills may take up to 3 business days. We ask that you follow-up with your pharmacy.

## 2021-05-09 ENCOUNTER — Ambulatory Visit
Admission: RE | Admit: 2021-05-09 | Discharge: 2021-05-09 | Disposition: A | Discharge status: Home / Self Care | Payer: BC Managed Care – PPO | Source: Ambulatory Visit | Attending: "Endocrinology | Admitting: "Endocrinology

## 2021-05-09 ENCOUNTER — Other Ambulatory Visit: Payer: Self-pay

## 2021-05-09 DIAGNOSIS — C801 Malignant (primary) neoplasm, unspecified: Secondary | ICD-10-CM | POA: Diagnosis not present

## 2021-05-09 DIAGNOSIS — C73 Malignant neoplasm of thyroid gland: Secondary | ICD-10-CM | POA: Diagnosis present

## 2021-05-23 ENCOUNTER — Ambulatory Visit (INDEPENDENT_AMBULATORY_CARE_PROVIDER_SITE_OTHER): Payer: BC Managed Care – PPO

## 2021-05-23 ENCOUNTER — Ambulatory Visit: Payer: BC Managed Care – PPO | Admitting: Podiatry

## 2021-05-23 ENCOUNTER — Other Ambulatory Visit: Payer: Self-pay

## 2021-05-23 DIAGNOSIS — M722 Plantar fascial fibromatosis: Secondary | ICD-10-CM | POA: Diagnosis not present

## 2021-05-23 DIAGNOSIS — M216X2 Other acquired deformities of left foot: Secondary | ICD-10-CM | POA: Diagnosis not present

## 2021-05-23 DIAGNOSIS — M2142 Flat foot [pes planus] (acquired), left foot: Secondary | ICD-10-CM

## 2021-05-23 DIAGNOSIS — M2141 Flat foot [pes planus] (acquired), right foot: Secondary | ICD-10-CM | POA: Diagnosis not present

## 2021-05-23 DIAGNOSIS — M21862 Other specified acquired deformities of left lower leg: Secondary | ICD-10-CM

## 2021-05-23 NOTE — Progress Notes (Signed)
  Subjective:  Patient ID: Aaron Schwartz, male    DOB: July 18, 1981,  MRN: [id number]  Chief Complaint  Patient presents with   Plantar Fasciitis     1 month  follow up  plantar fasciitis left.    40 y.o. male presents with the above complaint. History confirmed with patient.  Overall doing well the injection helped quite a bit and has been doing the therapy exercise and wearing a plantar fasciitis brace.  He is some new pain on the dorsal lateral foot over the fourth metatarsal wonders if it may be a stress fracture  Objective:  Physical Exam: warm, good capillary refill, no trophic changes or ulcerative lesions, normal DP and PT pulses, and normal sensory exam. Left Foot: No pain on palpation of the plantar calcaneal tubercle of the plantar fascia and on the plantar heel pad today.  He does have some tenderness over the fourth metatarsal.  Gastroc equinus a positive Silfverskiold test  Radiographs: Multiple views x-ray of the right foot new films taken today: No evidence of stress fracture he has pes planus deformity, no plantar heel spur there is a posterior calcaneal spur Assessment:   1. Plantar fasciitis of left foot   2. Gastrocnemius equinus of left lower extremity   3. Pes planus of both feet      Plan:  Patient was evaluated and treated and all questions answered.  Discussed the etiology and treatment options for plantar fasciitis including stretching, formal physical therapy, supportive shoegears such as a running shoe or sneaker, pre fabricated orthoses, injection therapy, and oral medications. We also discussed the role of surgical treatment of this for patients who do not improve after exhausting non-surgical treatment options.   -Continue stretching and icing of the affected limb -Continue wearing plantar fascial brace  -No injection performed today we will consider this at next visit if not improving or worsens -Tylenol as needed for pain -We again discussed custom  orthoses and he was molded for these today.  He thinks will benefit him from having a supportive deep heel cup and a heel relief for his plantar fasciitis. -Reviewed his new x-rays today with him no evidence of stress fracture I think is overcompensation and extensor tendinitis from trying to avoid pressure on the medial side of the foot and heel.  Custom molded orthoses should help with this as well        No follow-ups on file.

## 2021-05-26 ENCOUNTER — Other Ambulatory Visit: Payer: Self-pay | Admitting: Urology

## 2021-05-26 DIAGNOSIS — E291 Testicular hypofunction: Secondary | ICD-10-CM

## 2021-05-28 NOTE — Telephone Encounter (Signed)
He will need labs in 6 months (testosterone, PSA and H&H).

## 2021-07-19 ENCOUNTER — Ambulatory Visit: Payer: BC Managed Care – PPO | Admitting: Podiatry

## 2021-08-02 ENCOUNTER — Other Ambulatory Visit: Payer: Self-pay | Admitting: Urology

## 2021-08-02 DIAGNOSIS — E291 Testicular hypofunction: Secondary | ICD-10-CM

## 2021-08-09 ENCOUNTER — Ambulatory Visit: Payer: BC Managed Care – PPO | Admitting: Podiatry

## 2021-08-09 ENCOUNTER — Other Ambulatory Visit: Payer: Self-pay

## 2021-08-09 DIAGNOSIS — M722 Plantar fascial fibromatosis: Secondary | ICD-10-CM

## 2021-08-13 NOTE — Progress Notes (Signed)
  Subjective:  Patient ID: Aaron Schwartz, male    DOB: [birth year],  MRN: [id number]  Chief Complaint  Patient presents with   Plantar Fasciitis    8 week follow up, left foot    40 y.o. male presents with the above complaint. History confirmed with patient.  Overall doing much better he is nearly fully pain-free  Objective:  Physical Exam: warm, good capillary refill, no trophic changes or ulcerative lesions, normal DP and PT pulses, and normal sensory exam. Left Foot: No pain on palpation of the plantar calcaneal tubercle of the plantar fascia and on the plantar heel pad today.  Radiographs: Multiple views x-ray of the right foot new films taken today: No evidence of stress fracture he has pes planus deformity, no plantar heel spur there is a posterior calcaneal spur Assessment:   1. Plantar fasciitis of left foot       Plan:  Patient was evaluated and treated and all questions answered.  Discussed the etiology and treatment options for plantar fasciitis including stretching, formal physical therapy, supportive shoegears such as a running shoe or sneaker, pre fabricated orthoses, injection therapy, and oral medications. We also discussed the role of surgical treatment of this for patients who do not improve after exhausting non-surgical treatment options.   Doing much better he is continue his therapy exercises and stretching and manual therapy with lacrosse ball until his pain is resolved.  His orthotics are ready but are ready for dispensation in the [place] office.  I reviewed the break-in period with him and he will let me know if he has any problems with him and see me back for this as needed.  He will go by the office sometime to pick them up       Return if symptoms worsen or fail to improve.

## 2021-09-13 ENCOUNTER — Other Ambulatory Visit: Payer: Self-pay | Admitting: Family Medicine

## 2021-09-13 DIAGNOSIS — M1A09X0 Idiopathic chronic gout, multiple sites, without tophus (tophi): Secondary | ICD-10-CM

## 2021-09-13 NOTE — Telephone Encounter (Signed)
Call to patient- scheduled appointment- Rx refilled

## 2021-09-17 ENCOUNTER — Encounter: Payer: Self-pay | Admitting: Family Medicine

## 2021-09-17 LAB — HM DIABETES EYE EXAM

## 2021-09-20 ENCOUNTER — Encounter: Payer: BC Managed Care – PPO | Admitting: Family Medicine

## 2021-10-15 ENCOUNTER — Other Ambulatory Visit: Payer: Self-pay | Admitting: Urology

## 2021-10-15 DIAGNOSIS — E291 Testicular hypofunction: Secondary | ICD-10-CM

## 2021-11-02 ENCOUNTER — Other Ambulatory Visit: Payer: Self-pay | Admitting: Family Medicine

## 2021-11-02 DIAGNOSIS — F419 Anxiety disorder, unspecified: Secondary | ICD-10-CM

## 2021-11-02 DIAGNOSIS — E1169 Type 2 diabetes mellitus with other specified complication: Secondary | ICD-10-CM

## 2021-11-02 NOTE — Telephone Encounter (Signed)
Requested medication (s) are due for refill today: yes  Requested medication (s) are on the active medication list: yes  Last refill:  09/18/20  Future visit scheduled: no  Notes to clinic:  Unable to refill per protocol due to failed labs, no updated results.      Requested Prescriptions  Pending Prescriptions Disp Refills   FARXIGA 10 MG TABS tablet [Pharmacy Med Name: FARXIGA 10 MG TABLET] 90 tablet 3    Sig: TAKE 1 TABLET BY MOUTH DAILY BEFORE BREAKFAST.     Endocrinology:  Diabetes - SGLT2 Inhibitors Failed - 11/02/2021  5:12 PM      Failed - Cr in normal range and within 360 days    Creatinine  Date Value Ref Range Status  11/12/2020 1.4 (A) 0.6 - 1.3 Final   Creat  Date Value Ref Range Status  04/26/2019 1.12 0.60 - 1.35 mg/dL Final   Creatinine, Ser  Date Value Ref Range Status  09/19/2020 1.37 (H) 0.76 - 1.27 mg/dL Final          Failed - HBA1C is between 0 and 7.9 and within 180 days    Hgb A1c MFr Bld  Date Value Ref Range Status  09/19/2020 7.5 (H) 4.8 - 5.6 % Final    Comment:             Prediabetes: 5.7 - 6.4          Diabetes: >6.4          Glycemic control for adults with diabetes: <7.0           Failed - eGFR in normal range and within 360 days    GFR, Est African American  Date Value Ref Range Status  04/26/2019 95 > OR = 60 mL/min/1.77m Final   GFR calc Af Amer  Date Value Ref Range Status  09/19/2020 74 >59 mL/min/1.73 Final    Comment:    **In accordance with recommendations from the NKF-ASN Task force,**   [facility] is in the process of updating its eGFR calculation to the   2021 CKD-EPI creatinine equation that estimates kidney function   without a race variable.    GFR, Est Non African American  Date Value Ref Range Status  04/26/2019 82 > OR = 60 mL/min/1.778mFinal   GFR calc non Af Amer  Date Value Ref Range Status  09/19/2020 64 >59 mL/min/1.73 Final          Failed - Valid encounter within last 6 months    Recent  Outpatient Visits           1 year ago Annual physical exam   [facility]   1 year ago Bacterial conjunctivitis of right eye   [facility]   1 year ago Type 2 diabetes mellitus with other specified complication, [facility] long-[facility] use [facility]  [facility]   2 years ago Type 2 diabetes mellitus with other specified complication, [facility] long-term current use of insulin ([facility]  [facility]   2 years ago Type 2 diabetes mellitus with other specified complication, without long-term current use of insulin ([facility]  [facility]               escitalopram (LEXAPRO) 20 MG tablet [Pharmacy Med Name: ESCITALOPRAM 20 MG TABLET] 90 tablet 2  Sig: TAKE 1 TABLET BY MOUTH EVERY DAY     Psychiatry:  Antidepressants - SSRI Failed - 11/02/2021  5:12 PM      Failed - Valid encounter within last 6 months    Recent Outpatient Visits           1 year ago Annual physical exam   [place], DO   1 year ago Bacterial conjunctivitis of right eye   [facility], Lupita Raider, FNP   1 year ago Type 2 diabetes mellitus with other specified complication, without long-term current use of insulin [facility])   [facility], Devonne Doughty, DO   2 years ago Type 2 diabetes mellitus with other specified complication, without long-term current use of insulin [facility])   [facility], Devonne Doughty, DO   2 years ago Type 2 diabetes mellitus with other specified complication, without long-term current use of insulin [facility])   [place], Devonne Doughty, DO

## 2021-11-21 ENCOUNTER — Other Ambulatory Visit: Payer: Self-pay

## 2021-11-21 DIAGNOSIS — E291 Testicular hypofunction: Secondary | ICD-10-CM

## 2021-11-27 ENCOUNTER — Other Ambulatory Visit: Payer: Self-pay

## 2021-11-27 ENCOUNTER — Other Ambulatory Visit: Payer: BC Managed Care – PPO

## 2021-11-27 DIAGNOSIS — E291 Testicular hypofunction: Secondary | ICD-10-CM

## 2021-11-28 LAB — TESTOSTERONE: Testosterone: 334 ng/dL (ref 264–916)

## 2021-11-28 LAB — HEMOGLOBIN AND HEMATOCRIT, BLOOD
Hematocrit: 50.7 % (ref 37.5–51.0)
Hemoglobin: 16.1 g/dL (ref 13.0–17.7)

## 2021-11-28 LAB — PSA: Prostate Specific Ag, Serum: 0.5 ng/mL (ref 0.0–4.0)

## 2021-11-29 ENCOUNTER — Telehealth: Payer: Self-pay | Admitting: Urology

## 2021-11-29 NOTE — Telephone Encounter (Signed)
T level was 334- has he noticed any symptom improvement? ?

## 2021-11-30 ENCOUNTER — Encounter: Payer: Self-pay | Admitting: *Deleted

## 2021-12-12 ENCOUNTER — Other Ambulatory Visit: Payer: Self-pay | Admitting: Family Medicine

## 2021-12-12 DIAGNOSIS — M1A09X0 Idiopathic chronic gout, multiple sites, without tophus (tophi): Secondary | ICD-10-CM

## 2021-12-12 DIAGNOSIS — I10 Essential (primary) hypertension: Secondary | ICD-10-CM

## 2021-12-12 NOTE — Telephone Encounter (Signed)
Pt. Has appointment. ?

## 2021-12-12 NOTE — Telephone Encounter (Signed)
Requested medication (s) are due for refill today: Yes ? ?Requested medication (s) are on the active medication list: Yes ? ?Last refill:  09/18/20 ? ?Future visit scheduled: Yes ? ?Notes to clinic:  Prescription has expired. ? ? ? ?Requested Prescriptions  ?Pending Prescriptions Disp Refills  ? lisinopril (ZESTRIL) 20 MG tablet [Pharmacy Med Name: LISINOPRIL 20 MG TABLET] 90 tablet 3  ?  Sig: TAKE 1 TABLET BY MOUTH EVERY DAY  ?  ? Cardiovascular:  ACE Inhibitors Failed - 12/12/2021  2:00 AM  ?  ?  Failed - Cr in normal range and within 180 days  ?  Creatinine  ?Date Value Ref Range Status  ?11/12/2020 1.4 (A) 0.6 - 1.3 Final  ? ?Creat  ?Date Value Ref Range Status  ?04/26/2019 1.12 0.60 - 1.35 mg/dL Final  ? ?Creatinine, Ser  ?Date Value Ref Range Status  ?09/19/2020 1.37 (H) 0.76 - 1.27 mg/dL Final  ?  ?  ?  ?  Failed - K in normal range and within 180 days  ?  Potassium  ?Date Value Ref Range Status  ?11/12/2020 5.4 (A) 3.4 - 5.3 Final  ?  ?  ?  ?  Failed - Valid encounter within last 6 months  ?  Recent Outpatient Visits   ? ?      ? 1 year ago Annual physical exam  ? [place], DO  ? 1 year ago Bacterial conjunctivitis of right eye  ? [place], FNP  ? 2 years ago Type 2 diabetes mellitus with other specified complication, without long-term current use of insulin ([place])  ? [facility] [place], Devonne Doughty, DO  ? 2 years ago Type 2 diabetes mellitus with other specified complication, without long-term current use of insulin ([place])  ? [facility] [place], Devonne Doughty, DO  ? 2 years ago Type 2 diabetes mellitus with other specified complication, without long-term current use of insulin ([place])  ? [place], DO  ? ?  ?  ?Future Appointments   ? ?        ? In 2 weeks [place], Devonne Doughty, DO [facility] [place]  ? ?  ? ?  ?  ?  Passed - Patient is  not pregnant  ?  ?  Passed - Last BP in normal range  ?  BP Readings from Last 1 Encounters:  ?12/14/20 138/70  ?  ?  ?  ?  ?Signed Prescriptions Disp Refills  ? allopurinol (ZYLOPRIM) 100 MG tablet 180 tablet 0  ?  Sig: TAKE 2 TABLETS BY MOUTH EVERY DAY  ?  ? Endocrinology:  Gout Agents - allopurinol Failed - 12/12/2021  2:00 AM  ?  ?  Failed - Uric Acid in normal range and within 360 days  ?  Uric Acid  ?Date Value Ref Range Status  ?09/19/2020 5.9 3.8 - 8.4 mg/dL Final  ?  Comment:  ?             Therapeutic target for gout patients: <6.0  ?  ?  ?  ?  Failed - Cr in normal range and within 360 days  ?  Creatinine  ?Date Value Ref Range Status  ?11/12/2020 1.4 (A) 0.6 - 1.3 Final  ? ?Creat  ?Date Value Ref Range Status  ?04/26/2019 1.12 0.60 - 1.35 mg/dL Final  ? ?Creatinine, Ser  ?Date Value Ref  Range Status  ?09/19/2020 1.37 (H) 0.76 - 1.27 mg/dL Final  ?  ?  ?  ?  Failed - Valid encounter within last 12 months  ?  Recent Outpatient Visits   ? ?      ? 1 year ago Annual physical exam  ? [place], DO  ? 1 year ago Bacterial conjunctivitis of right eye  ? [place], FNP  ? 2 years ago Type 2 diabetes mellitus with other specified complication, without long-term current use of insulin ([place])  ? [facility] [place], Devonne Doughty, DO  ? 2 years ago Type 2 diabetes mellitus with other specified complication, without long-term current use of insulin ([place])  ? [facility] [place], Devonne Doughty, DO  ? 2 years ago Type 2 diabetes mellitus with other specified complication, without long-term current use of insulin ([place])  ? [place], DO  ? ?  ?  ?Future Appointments   ? ?        ? In 2 weeks [place] Devonne Doughty, DO [facility] [place]  ? ?  ? ?  ?  ?  Failed - CBC within normal limits and completed in the last 12 months  ?  WBC  ?Date Value Ref Range  Status  ?11/12/2020 4.0  Final  ?03/31/2017 4.4 3.8 - 10.8 K/uL Final  ? ?RBC  ?Date Value Ref Range Status  ?11/12/2020 5.26 (A) 3.87 - 5.11 Final  ? ?Hemoglobin  ?Date Value Ref Range Status  ?11/27/2021 16.1 13.0 - 17.7 g/dL Final  ? ?Hematocrit  ?Date Value Ref Range Status  ?11/27/2021 50.7 37.5 - 51.0 % Final  ? ?MCHC  ?Date Value Ref Range Status  ?09/19/2020 32.9 31.5 - 35.7 g/dL Final  ?03/31/2017 32.5 32.0 - 36.0 g/dL Final  ? ?MCH  ?Date Value Ref Range Status  ?09/19/2020 29.1 26.6 - 33.0 pg Final  ?03/31/2017 28.9 27.0 - 33.0 pg Final  ? ?MCV  ?Date Value Ref Range Status  ?09/19/2020 89 79 - 97 fL Final  ? ?No results found for: PLTCOUNTKUC, LABPLAT, [place] ?RDW  ?Date Value Ref Range Status  ?09/19/2020 13.3 11.6 - 15.4 % Final  ? ?  ?  ?  ? ?

## 2021-12-13 ENCOUNTER — Other Ambulatory Visit: Payer: Self-pay | Admitting: Family Medicine

## 2021-12-13 NOTE — Telephone Encounter (Signed)
Requested medication (s) are due for refill today: yes ? ?Requested medication (s) are on the active medication list: yes ? ?Last refill:  05/08/21 #48 ml 1 refill ? ?Future visit scheduled: yes in 2 weeks ? ?Notes to clinic:  do you want courtesy refill for the next to 2 weeks, per protocol? ? ? ?  ?  ? ?

## 2021-12-28 ENCOUNTER — Encounter: Payer: Self-pay | Admitting: Family Medicine

## 2021-12-28 ENCOUNTER — Ambulatory Visit: Payer: BC Managed Care – PPO | Admitting: Family Medicine

## 2021-12-28 VITALS — BP 136/88 | HR 76 | Temp 97.1°F | Ht 75.0 in | Wt 305.0 lb

## 2021-12-28 DIAGNOSIS — C73 Malignant neoplasm of thyroid gland: Secondary | ICD-10-CM

## 2021-12-28 DIAGNOSIS — E3122 Multiple endocrine neoplasia [MEN] type IIA: Secondary | ICD-10-CM

## 2021-12-28 DIAGNOSIS — M1A09X0 Idiopathic chronic gout, multiple sites, without tophus (tophi): Secondary | ICD-10-CM

## 2021-12-28 DIAGNOSIS — N521 Erectile dysfunction due to diseases classified elsewhere: Secondary | ICD-10-CM

## 2021-12-28 DIAGNOSIS — E782 Mixed hyperlipidemia: Secondary | ICD-10-CM

## 2021-12-28 DIAGNOSIS — E1169 Type 2 diabetes mellitus with other specified complication: Secondary | ICD-10-CM

## 2021-12-28 DIAGNOSIS — I10 Essential (primary) hypertension: Secondary | ICD-10-CM

## 2021-12-28 DIAGNOSIS — D3500 Benign neoplasm of unspecified adrenal gland: Secondary | ICD-10-CM

## 2021-12-28 DIAGNOSIS — F419 Anxiety disorder, unspecified: Secondary | ICD-10-CM

## 2021-12-28 MED ORDER — SILDENAFIL CITRATE 20 MG PO TABS: ORAL_TABLET | ORAL | 0 refills | Status: DC

## 2021-12-28 MED ORDER — FARXIGA 10 MG PO TABS: 10.0000 mg | ORAL_TABLET | Freq: Every day | ORAL | 3 refills | Status: DC

## 2021-12-28 MED ORDER — BUSPIRONE HCL 10 MG PO TABS: 10.0000 mg | ORAL_TABLET | Freq: Two times a day (BID) | ORAL | 2 refills | Status: DC

## 2021-12-28 MED ORDER — SILDENAFIL CITRATE 20 MG PO TABS: ORAL_TABLET | ORAL | 3 refills | Status: DC

## 2021-12-28 MED ORDER — ALLOPURINOL 100 MG PO TABS: 200.0000 mg | ORAL_TABLET | Freq: Every day | ORAL | 3 refills | Status: DC

## 2021-12-28 NOTE — Patient Instructions (Addendum)
Thank you for coming to the office today. ? ?Medication Taper Off Instructions ?Current med - Escitalopram '20mg'$  ? ?Week 1: Alternate every OTHER day - Escitalopram '20mg'$  (whole tab) and then next day '10mg'$  (HALF tab) ?Week 2-3: 10 mg daily (HALF tablet) ?Week 4: '10mg'$  every OTHER day (HALF tablet) - alternating day is NO medicine (skip dose) ?STOP  completely ? ?Optional weeks 5-6: you may extend the week 4 plan for up to 3 weeks if need. Alternating half pill one day and no pill the next. May space out to every 3 days if need. ? ? ?Go ahead and start new Buspar for anxiety - can ease into it with a half once or twice a day, then gradually go up to a whole pill '10mg'$  once or twice a day, keep it consistent once you find a dose that works. Most patients are on '10mg'$  twice every day. Can go higher if needed. ? ?If this does not fully control the mood / anxiety, after a couple weeks, let me know, we can start the Wellbutrin (depression) ? ?Hopefully when you come off Escitalopram, ED problem could improve. ? ?If not or if you prefer to try it sooner, can take the Sildenafil (Generic Viagra) ? ? ?Please schedule a Follow-up Appointment to: Return in about 6 weeks (around 02/08/2022) for 6 weeks follow-up Anxiety/Mood, ED, updates. ? ?If you have any other questions or concerns, please feel free to call the office or send a message through [place]. You may also schedule an earlier appointment if necessary. ? ?Additionally, you may be receiving a survey about your experience at our office within a few days to 1 week by e-mail or mail. We value your feedback. ? ?Nobie Putnam, DO ?[place] ?

## 2021-12-28 NOTE — Progress Notes (Signed)
? ?Subjective:  ? ? Patient ID: Aaron Schwartz, male    DOB: Jul 02, 1981, 41 y.o.   MRN: [id number] ? ?Aaron Schwartz is a 41 y.o. male presenting on 12/28/2021 for No chief complaint on file. ? ? ?HPI ? ? ?CHRONIC DM, Type 2 / Morbid Obesity BMI >38 ?Updates ?>1 year ago 7.5 ?Due for lab ?CBGs: None recently ?Meds: Wilder Glade '10mg'$  daily - tolerating well, staying hydrated. ?Currently on ACEi ?Lifestyle: ?Weight stable ?- Diet (improved appetite now, improved hydration) ?- Exercise (increased activity w/ football coaching) ?Last DM Eye Exam [place] - request record from 08/23/19 - needs repeat exam ?Denies hypoglycemia, polyuria, visual changes, numbness or tingling ?  ?Anxiety ?Currently seems controlled and improved by his report, see scores below ?Continues on Escitalopram Lexapro '20mg'$  daily ?Asks about ED side effect - erectile dysfunction, he has hypogonadism and it is treated on testosterone. ? ? ?MEN II A / with pheochromocytoma ?S/p R thyroidectomy and R adrenalectomy. 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 ?Upcoming Endocrinology [facility] 04/26/22 ?Continuing w/ thyroid management ? ? ? ?  12/28/2021  ? 11:04 AM 09/18/2020  ?  1:43 PM 12/06/2019  ?  8:59 AM  ?Depression screen PHQ 2/9  ?Decreased Interest 1 0 0  ?Down, Depressed, Hopeless 1 0 0  ?PHQ - 2 Score 2 0 0  ?Altered sleeping 2  0  ?Tired, decreased energy 2  0  ?Change in appetite 1  0  ?Feeling bad or failure about yourself  1  0  ?Trouble concentrating 1  0  ?Moving slowly or fidgety/restless 0  0  ?Suicidal thoughts 0  0  ?PHQ-9 Score 9  0  ?Difficult doing work/chores Somewhat difficult  Not difficult at all  ? ? ?  12/28/2021  ? 11:04 AM 12/06/2019  ?  8:59 AM 04/29/2019  ? 11:47 AM 02/17/2019  ?  9:05 AM  ?GAD 7 : Generalized Anxiety Score  ?Nervous, Anxious, on Edge '2 1 2 1  '$ ?Control/stop worrying '2 1 2 1  '$ ?Worry too much - different things '2 1 2 1  '$ ?Trouble relaxing '2 1 2 1  '$ ?Restless 1 0 1 1  ?Easily annoyed  or irritable 2 0 1 1  ?Afraid - awful might happen 3 0 2 1  ?Total GAD 7 Score '14 4 12 7  '$ ?Anxiety Difficulty  Not difficult at all Somewhat difficult Not difficult at all  ? ? ? ? ?Social History  ? ?Tobacco Use  ? Smoking status: Never  ? Smokeless tobacco: Never  ?Vaping Use  ? Vaping Use: Never used  ?Substance Use Topics  ? Alcohol use: Yes  ?  Comment: occasionally   ? Drug use: No  ? ? ?Review of Systems ?Per HPI unless specifically indicated above ? ?   ?Objective:  ?  ?BP 136/88 (BP Location: Left Arm, Patient Position: Sitting, Cuff Size: Large)   Pulse 76   Temp (!) 97.1 ?F (36.2 ?C) (Temporal)   Ht '6\' 3"'$  (1.905 m)   Wt (!) 305 lb (138.3 kg)   SpO2 99%   BMI 38.12 kg/m?   ?Wt Readings from Last 3 Encounters:  ?12/28/21 (!) 305 lb (138.3 kg)  ?12/14/20 (!) 310 lb (140.6 kg)  ?09/18/20 (!) 309 lb (140.2 kg)  ?  ?Physical Exam ?Vitals and nursing note reviewed.  ?Constitutional:   ?   General: He is not in acute distress. ?   Appearance: He is well-developed. He is not diaphoretic.  ?  Comments: Well-appearing, comfortable, cooperative  ?HENT:  ?   Head: Normocephalic and atraumatic.  ?Eyes:  ?   General:     ?   Right eye: No discharge.     ?   Left eye: No discharge.  ?   Conjunctiva/sclera: Conjunctivae normal.  ?   Pupils: Pupils are equal, round, and reactive to light.  ?Neck:  ?   Thyroid: No thyromegaly.  ?Cardiovascular:  ?   Rate and Rhythm: Normal rate and regular rhythm.  ?   Pulses: Normal pulses.  ?   Heart sounds: Normal heart sounds. No murmur heard. ?Pulmonary:  ?   Effort: Pulmonary effort is normal. No respiratory distress.  ?   Breath sounds: Normal breath sounds. No wheezing or rales.  ?Abdominal:  ?   General: Bowel sounds are normal. There is no distension.  ?   Palpations: Abdomen is soft. There is no mass.  ?   Tenderness: There is no abdominal tenderness.  ?Musculoskeletal:     ?   General: No tenderness. Normal range of motion.  ?   Cervical back: Normal range of motion and  neck supple.  ?   Comments: Upper / Lower Extremities: ?- Normal muscle tone, strength bilateral upper extremities 5/5, lower extremities 5/5  ?Lymphadenopathy:  ?   Cervical: No cervical adenopathy.  ?Skin: ?   General: Skin is warm and dry.  ?   Findings: No erythema or rash.  ?Neurological:  ?   Mental Status: He is alert and oriented to person, place, and time.  ?   Comments: Distal sensation intact to light touch all extremities  ?Psychiatric:     ?   Mood and Affect: Mood normal.     ?   Behavior: Behavior normal.     ?   Thought Content: Thought content normal.  ?   Comments: Well groomed, good eye contact, normal speech and thoughts  ? ?Results for orders placed or performed in visit on 11/27/21  ?Hemoglobin and hematocrit, blood  ?Result Value Ref Range  ? Hemoglobin 16.1 13.0 - 17.7 g/dL  ? Hematocrit 50.7 37.5 - 51.0 %  ?PSA  ?Result Value Ref Range  ? Prostate Specific Ag, Serum 0.5 0.0 - 4.0 ng/mL  ?Testosterone  ?Result Value Ref Range  ? Testosterone 334 264 - 916 ng/dL  ? ?   ?Assessment & Plan:  ? ?Problem List Items Addressed This Visit   ? ? Type 2 diabetes mellitus with other specified complication ([place]) - Primary  ? Relevant Medications  ? FARXIGA 10 MG TABS tablet  ? Other Relevant Orders  ? Hemoglobin A1c  ? Morbid obesity ([place])  ? Relevant Medications  ? FARXIGA 10 MG TABS tablet  ? Other Relevant Orders  ? Lipid panel  ? MEN 2A syndrome ([place])  ? Medullary thyroid carcinoma and pheochromocytoma (HCC)  ? Relevant Medications  ? levothyroxine (SYNTHROID) 137 MCG tablet  ? allopurinol (ZYLOPRIM) 100 MG tablet  ? Idiopathic chronic gout of multiple sites without tophus  ? Relevant Medications  ? allopurinol (ZYLOPRIM) 100 MG tablet  ? Other Relevant Orders  ? Uric acid  ? Anxiety  ? Relevant Medications  ? busPIRone (BUSPAR) 10 MG tablet  ? ?Other Visit Diagnoses   ? ? Essential hypertension      ? Relevant Medications  ? sildenafil (REVATIO) 20 MG tablet  ? Erectile dysfunction due to diseases  classified elsewhere      ? Relevant Medications  ? sildenafil (  REVATIO) 20 MG tablet  ? Mixed hyperlipidemia      ? Relevant Medications  ? sildenafil (REVATIO) 20 MG tablet  ? Other Relevant Orders  ? Lipid panel  ? ?  ?  ?T2DM ?Due for repeat A1c ?Re order Wilder Glade '10mg'$  daily ? ?Gout, chronic ?No flares recently ?Re order Allopurinol '200mg'$  daily ('100mg'$  x 2) ?Check Uric Acid level ? ?ED ?Likely med side effect from SSRI ?Otherwise can be other factors ?Will trial taper off SSRI Escitalopram, on to Buspar for anxiety and also order rx Sildenafil #90 pill goodrx at [place], for PRN ?Future refer to Urology if indicated ? ?Anxiety ?See above, phase off of eScitralopram SSRI, can start Buspar now during taper, 5-'10mg'$  BID dosing, adjust if indicated ?Future consider add Wellbutrin if indicated as well. Goal to avoid ED side effect ? ?Labs ordered today to [facility] A1c Lipid Uric Acid ? ?Meds ordered this encounter  ?Medications  ? allopurinol (ZYLOPRIM) 100 MG tablet  ?  Sig: Take 2 tablets (200 mg total) by mouth daily.  ?  Dispense:  180 tablet  ?  Refill:  3  ?  Add extra refills, patient already filled in March 2023, not ready to fill today.  ? DISCONTD: sildenafil (REVATIO) 20 MG tablet  ?  Sig: Take 1-5 pills about 30 min prior to sex. Start with 1 and increase as needed.  ?  Dispense:  20 tablet  ?  Refill:  0  ? busPIRone (BUSPAR) 10 MG tablet  ?  Sig: Take 1 tablet (10 mg total) by mouth 2 (two) times daily. For anxiety  ?  Dispense:  60 tablet  ?  Refill:  2  ? FARXIGA 10 MG TABS tablet  ?  Sig: Take 1 tablet (10 mg total) by mouth daily before breakfast.  ?  Dispense:  90 tablet  ?  Refill:  3  ? sildenafil (REVATIO) 20 MG tablet  ?  Sig: Take 1-5 pills about 30 min prior to sex. Start with 1 and increase as needed.  ?  Dispense:  90 tablet  ?  Refill:  3  ? ? ? ? ?Follow up plan: ?Return in about 6 weeks (around 02/08/2022) for 6 weeks follow-up Anxiety/Mood, ED, updates. ? ? ?Nobie Putnam, DO ?[facility] ?[facility] ?12/28/2021, 11:09 AM ?

## 2022-01-08 LAB — LIPID PANEL
Chol/HDL Ratio: 5.7 ratio — ABNORMAL HIGH (ref 0.0–5.0)
Cholesterol, Total: 278 mg/dL — ABNORMAL HIGH (ref 100–199)
HDL: 49 mg/dL (ref >39)
LDL Chol Calc (NIH): 201 mg/dL — ABNORMAL HIGH (ref 0–99)
Triglycerides: 150 mg/dL — ABNORMAL HIGH (ref 0–149)
VLDL Cholesterol Cal: 28 mg/dL (ref 5–40)

## 2022-01-08 LAB — URIC ACID: Uric Acid: 6 mg/dL (ref 3.8–8.4)

## 2022-01-08 LAB — HEMOGLOBIN A1C
Est. average glucose Bld gHb Est-mCnc: 246 mg/dL
Hgb A1c MFr Bld: 10.2 % — ABNORMAL HIGH (ref 4.8–5.6)

## 2022-01-09 ENCOUNTER — Other Ambulatory Visit: Payer: Self-pay | Admitting: Family Medicine

## 2022-01-09 DIAGNOSIS — E1169 Type 2 diabetes mellitus with other specified complication: Secondary | ICD-10-CM

## 2022-01-09 MED ORDER — ATORVASTATIN CALCIUM 10 MG PO TABS: 10.0000 mg | ORAL_TABLET | Freq: Every day | ORAL | 3 refills | Status: DC

## 2022-01-09 MED ORDER — JANUVIA 100 MG PO TABS: 100.0000 mg | ORAL_TABLET | Freq: Every day | ORAL | 2 refills | Status: DC

## 2022-01-10 ENCOUNTER — Other Ambulatory Visit: Payer: Self-pay | Admitting: Urology

## 2022-01-10 DIAGNOSIS — E291 Testicular hypofunction: Secondary | ICD-10-CM

## 2022-01-20 ENCOUNTER — Other Ambulatory Visit: Payer: Self-pay | Admitting: Family Medicine

## 2022-01-20 DIAGNOSIS — F419 Anxiety disorder, unspecified: Secondary | ICD-10-CM

## 2022-01-22 NOTE — Telephone Encounter (Signed)
Requested medication (s) are due for refill today: No ? ?Requested medication (s) are on the active medication list: Yes ? ?Last refill:  12/28/21 ? ?Future visit scheduled: Yes ? ?Notes to clinic:  Pharmacy requests 90 day supply and diagnosis code. ? ? ? ?Requested Prescriptions  ?Pending Prescriptions Disp Refills  ? busPIRone (BUSPAR) 10 MG tablet [Pharmacy Med Name: BUSPIRONE HCL 10 MG TABLET] 180 tablet 1  ?  Sig: Take 1 tablet (10 mg total) by mouth 2 (two) times daily. For anxiety  ?  ? Psychiatry: Anxiolytics/Hypnotics - Non-controlled Passed - 01/20/2022 12:33 PM  ?  ?  Passed - Valid encounter within last 12 months  ?  Recent Outpatient Visits   ? ?      ? 3 weeks ago Type 2 diabetes mellitus with other specified complication, without long-term current use of insulin ([place])  ? [place], DO  ? 1 year ago Annual physical exam  ? [place], DO  ? 1 year ago Bacterial conjunctivitis of right eye  ? [place], FNP  ? 2 years ago Type 2 diabetes mellitus with other specified complication, without long-term current use of insulin ([place])  ? [facility] [place], Devonne Doughty, DO  ? 2 years ago Type 2 diabetes mellitus with other specified complication, without long-term current use of insulin ([place])  ? [place], DO  ? ?  ?  ?Future Appointments   ? ?        ? In 2 weeks [place] Devonne Doughty, DO [facility], [place]  ? ?  ? ? ?  ?  ?  ? ?

## 2022-01-30 ENCOUNTER — Telehealth: Payer: BC Managed Care – PPO | Admitting: Physician Assistant

## 2022-01-30 DIAGNOSIS — J069 Acute upper respiratory infection, unspecified: Secondary | ICD-10-CM | POA: Diagnosis not present

## 2022-01-30 MED ORDER — IPRATROPIUM BROMIDE 0.03 % NA SOLN: 2.0000 | Freq: Two times a day (BID) | NASAL | 0 refills | Status: AC

## 2022-01-30 MED ORDER — BENZONATATE 100 MG PO CAPS: 100.0000 mg | ORAL_CAPSULE | Freq: Three times a day (TID) | ORAL | 0 refills | Status: DC | PRN

## 2022-01-30 NOTE — Progress Notes (Signed)
E-Visit for Upper Respiratory Infection  ? ?We are sorry you are not feeling well.  Here is how we plan to help! ? ?Based on what you have shared with me, it looks like you may have a viral upper respiratory infection.  Upper respiratory infections are caused by a large number of viruses; however, rhinovirus is the most common cause.  ? ?Symptoms vary from person to person, with common symptoms including sore throat, cough, fatigue or lack of energy and feeling of general discomfort.  A low-grade fever of up to 100.4 may present, but is often uncommon.  Symptoms vary however, and are closely related to a person's age or underlying illnesses.  The most common symptoms associated with an upper respiratory infection are nasal discharge or congestion, cough, sneezing, headache and pressure in the ears and face.  These symptoms usually persist for about 3 to 10 days, but can last up to 2 weeks.  It is important to know that upper respiratory infections do not cause serious illness or complications in most cases.   ? ?Upper respiratory infections can be transmitted from person to person, with the most common method of transmission being a person's hands.  The virus is able to live on the skin and can infect other persons for up to 2 hours after direct contact.  Also, these can be transmitted when someone coughs or sneezes; thus, it is important to cover the mouth to reduce this risk.  To keep the spread of the illness at [place], good hand hygiene is very important. ? ?This is an infection that is most likely caused by a virus. There are no specific treatments other than to help you with the symptoms until the infection runs its course.  We are sorry you are not feeling well.  Here is how we plan to help! ? ? ?For nasal congestion, you may use an oral decongestants such as Mucinex D or if you have glaucoma or high blood pressure use plain Mucinex.  Saline nasal spray or nasal drops can help and can safely be used as often as  needed for congestion.  For your congestion, I have prescribed Ipratropium Bromide nasal spray 0.03% two sprays in each nostril 2-3 times a day ? ?If you do not have a history of heart disease, hypertension, diabetes or thyroid disease, prostate/bladder issues or glaucoma, you may also use Sudafed to treat nasal congestion.  It is highly recommended that you consult with a pharmacist or your primary care physician to ensure this medication is safe for you to take.    ? ?If you have a cough, you may use cough suppressants such as Delsym and Robitussin.  If you have glaucoma or high blood pressure, you can also use Coricidin HBP.   ?For cough I have prescribed for you A prescription cough medication called Tessalon Perles 100 mg. You may take 1-2 capsules every 8 hours as needed for cough ? ?If you have a sore or scratchy throat, use a saltwater gargle- ? to ? teaspoon of salt dissolved in a 4-ounce to 8-ounce glass of warm water.  Gargle the solution for approximately 15-30 seconds and then spit.  It is important not to swallow the solution.  You can also use throat lozenges/cough drops and Chloraseptic spray to help with throat pain or discomfort.  Warm or cold liquids can also be helpful in relieving throat pain. ? ?For headache, pain or general discomfort, you can use Ibuprofen or Tylenol as directed.   ?  Some authorities believe that zinc sprays or the use of Echinacea may shorten the course of your symptoms. ? ?May consider testing for Covid 19 if desired. ? ? ?HOME CARE ?Only take medications as instructed by your medical team. ?Be sure to drink plenty of fluids. Water is fine as well as fruit juices, sodas and electrolyte beverages. You may want to stay away from caffeine or alcohol. If you are nauseated, try taking small sips of liquids. How do you know if you are getting enough fluid? Your urine should be a pale yellow or almost colorless. ?Get rest. ?Taking a steamy shower or using a humidifier may help  nasal congestion and ease sore throat pain. You can place a towel over your head and breathe in the steam from hot water coming from a faucet. ?Using a saline nasal spray works much the same way. ?Cough drops, hard candies and sore throat lozenges may ease your cough. ?Avoid close contacts especially the very young and the elderly ?Cover your mouth if you cough or sneeze ?Always remember to wash your hands.  ? ?GET HELP RIGHT AWAY IF: ?You develop worsening fever. ?If your symptoms do not improve within 10 days ?You develop yellow or green discharge from your nose over 3 days. ?You have coughing fits ?You develop a severe head ache or visual changes. ?You develop shortness of breath, difficulty breathing or start having chest pain ?Your symptoms persist after you have completed your treatment plan ? ?MAKE SURE YOU  ?Understand these instructions. ?Will watch your condition. ?Will get help right away if you are not doing well or get worse. ? ?Thank you for choosing an e-visit. ? ?Your e-visit answers were reviewed by a board certified advanced clinical practitioner to complete your personal care plan. Depending upon the condition, your plan could have included both over the counter or prescription medications. ? ?Please review your pharmacy choice. Make sure the pharmacy is open so you can pick up prescription now. If there is a problem, you may contact your provider through [employer] and have the prescription routed to another pharmacy.  Your safety is important to [place]. If you have drug allergies check your prescription carefully.  ? ?For the next 24 hours you can use MyChart to ask questions about today's visit, request a non-urgent call back, or ask for a work or school excuse. ?You will get an email in the next two days asking about your experience. I hope that your e-visit has been valuable and will speed your recovery. ? ? ? ?I provided 5 minutes of non face-to-face time during this encounter for chart  review and documentation.  ? ?

## 2022-02-06 ENCOUNTER — Ambulatory Visit: Payer: BC Managed Care – PPO | Admitting: Family Medicine

## 2022-02-11 ENCOUNTER — Telehealth: Payer: Self-pay | Admitting: Family Medicine

## 2022-02-11 ENCOUNTER — Other Ambulatory Visit: Payer: Self-pay | Admitting: Urology

## 2022-02-11 DIAGNOSIS — F331 Major depressive disorder, recurrent, moderate: Secondary | ICD-10-CM

## 2022-02-11 DIAGNOSIS — E291 Testicular hypofunction: Secondary | ICD-10-CM

## 2022-02-11 MED ORDER — BUPROPION HCL ER (XL) 150 MG PO TB24: 150.0000 mg | ORAL_TABLET | Freq: Every day | ORAL | 0 refills | Status: DC

## 2022-02-11 NOTE — Telephone Encounter (Signed)
Pt called and stated that he was on the medication or anxiety and depression and was changed. Patient does not remember what this what called. Patient would like to go back to this medication. Patient would like a call back from the office. Please advise.  ?

## 2022-02-11 NOTE — Telephone Encounter (Signed)
Actually, I went ahead and called him today to ask him these questions and it turns out we discussed several options and will now start Wellbutrin XL '150mg'$  daily and he will keep his Buspar for anxiety. ? ?We decided against restarting fluoxetine or escitalopram for now. ? ?[facility]dered [facility]. He has apt in June ? ?Nobie Putnam, DO ?[facility] ?[facility] ?02/11/2022, 5:46 PM ? ?

## 2022-02-11 NOTE — Telephone Encounter (Signed)
Looks like end of March 2023, we tapered him off of Escitalopram '20mg'$  and switched to Buspar to avoid a side effect. ? ?Sounds like he preferred how he was doing on Escitalopram? ? ?Can you call and confirm this and then also ask if he plans to take BOTH medicines? ? ?Or does he prefer to stop Buspar and just only go back to Escitalopram/Lexapro? ? ?One last thing - the chart also shows he took Fluoxetine back in 2022, can you confirm that he is referring to Escitalopram/Lexapro and not the Fluoxetine - when he says he wants to restart. ? ?Need clarification in order to proceed. ? ?Thanks!  ? ?Nobie Putnam, DO ?[facility] ?[facility] ?02/11/2022, 10:59 AM ? ?

## 2022-02-17 ENCOUNTER — Other Ambulatory Visit: Payer: Self-pay | Admitting: Urology

## 2022-02-17 DIAGNOSIS — E291 Testicular hypofunction: Secondary | ICD-10-CM

## 2022-02-18 ENCOUNTER — Other Ambulatory Visit: Payer: Self-pay | Admitting: Family Medicine

## 2022-02-18 DIAGNOSIS — J4520 Mild intermittent asthma, uncomplicated: Secondary | ICD-10-CM

## 2022-02-19 NOTE — Telephone Encounter (Signed)
Requested Prescriptions  Pending Prescriptions Disp Refills  . albuterol (VENTOLIN HFA) 108 (90 Base) MCG/ACT inhaler [Pharmacy Med Name: ALBUTEROL HFA (PROAIR) INHALER] 8.5 each 4    Sig: TAKE 2 PUFFS BY MOUTH EVERY 6 HOURS AS NEEDED FOR WHEEZE OR SHORTNESS OF BREATH     Pulmonology:  Beta Agonists 2 Passed - 02/18/2022  2:19 AM      Passed - Last BP in normal range    BP Readings from Last 1 Encounters:  12/28/21 136/88         Passed - Last Heart Rate in normal range    Pulse Readings from Last 1 Encounters:  12/28/21 76         Passed - Valid encounter within last 12 months    Recent Outpatient Visits          1 month ago Type 2 diabetes mellitus with other specified complication, without long-term current use of insulin ([place])   [place], DO   1 year ago Annual physical exam   [place], DO   1 year ago Bacterial conjunctivitis of right eye   [facility], Lupita Raider, FNP   2 years ago Type 2 diabetes mellitus with other specified complication, without long-term current use of insulin [facility])   [facility], Devonne Doughty, DO   2 years ago Type 2 diabetes mellitus with other specified complication, without long-term current use of insulin ([place])   [facility] [place], Devonne Doughty, DO      Future Appointments            In 1 week [place], Devonne Doughty, DO [facility], [facility]

## 2022-02-22 ENCOUNTER — Other Ambulatory Visit: Payer: Self-pay | Admitting: Urology

## 2022-02-22 DIAGNOSIS — E291 Testicular hypofunction: Secondary | ICD-10-CM

## 2022-02-26 ENCOUNTER — Encounter: Payer: Self-pay | Admitting: *Deleted

## 2022-03-01 ENCOUNTER — Encounter: Payer: Self-pay | Admitting: Family Medicine

## 2022-03-01 ENCOUNTER — Ambulatory Visit (INDEPENDENT_AMBULATORY_CARE_PROVIDER_SITE_OTHER): Payer: BC Managed Care – PPO | Admitting: Family Medicine

## 2022-03-01 VITALS — BP 128/88 | HR 84 | Ht 75.0 in | Wt 298.6 lb

## 2022-03-01 DIAGNOSIS — F331 Major depressive disorder, recurrent, moderate: Secondary | ICD-10-CM

## 2022-03-01 DIAGNOSIS — F419 Anxiety disorder, unspecified: Secondary | ICD-10-CM | POA: Diagnosis not present

## 2022-03-01 MED ORDER — CLONAZEPAM 0.5 MG PO TABS: 0.5000 mg | ORAL_TABLET | Freq: Two times a day (BID) | ORAL | 2 refills | Status: DC | PRN

## 2022-03-01 MED ORDER — BUPROPION HCL ER (XL) 300 MG PO TB24: 300.0000 mg | ORAL_TABLET | Freq: Every day | ORAL | 1 refills | Status: DC

## 2022-03-01 NOTE — Patient Instructions (Addendum)
Thank you for coming to the office today.  Finish the Wellbutrin XL 150 for remaining 1-2 weeks, if it works suddenly - then let me know and I can refill that dosage.  If it doesn't work as well, then pick up the new one. '300mg'$  daily. 90 day  Adding Clonazepam (generic Klonopin) 0.'5mg'$  1 tablet per dose, up to max 2 in a day, should be taken about 6-8 hours apart if you end up taking the 2nd dose.   These offices have both PSYCHIATRY doctors and [place] (Virtual Available) [place] [address] [place], [place] [address] Phone: [phone]  [facility] Address: [address] [place], [place] [address] [email or web address] Phone: [phone]  [place] ([place] at [facility]) Address: [place], [place], [place] [address] Hours: 8:30AM-5PM Phone: [phone]  [place],  [place]  [address] Phone: [phone] Fax: [place], [place] [address] Phone: [phone]  [facility] (All ages) [address] [place], [id number] Phone: [phone] (Option 1) [email or web address]  ----------------------------------------------------------------- THERAPIST ONLY  (No Psychiatry)  [facility] [address], [place] [address] Johnnette Litter P: [place].   Address: [place], [place], [place] [address] Hours: Open today  9AM-7PM Phone: [phone]  [facility] [address], [place] Address: [address] [place], [place], [place] [address] Phone: [phone]    Please schedule a Follow-up Appointment to: Return if symptoms worsen or fail to improve.  If you have any other questions or concerns, please feel free to  call the office or send a message through [place]. You may also schedule an earlier appointment if necessary.  Additionally, you may be receiving a survey about your experience at our office within a few days to 1 week by e-mail or mail. We value your feedback.  Nobie Putnam, DO [place]

## 2022-03-01 NOTE — Progress Notes (Signed)
Subjective:    Patient ID: Aaron Schwartz, male    DOB: January 25, 1981, 41 y.o.   MRN: [id number]  Aaron Schwartz is a 41 y.o. male presenting on 03/01/2022 for Anxiety and Erectile Dysfunction   HPI  Depression recurrent Anxiety  Recent update 02/11/22 phone call regarding his mood/anxiety - I spoke with him on 5/15, we started Wellbutrin XL '150mg'$  daily due to concern with prior ED on SSRI and also kept on Buspar regular dosing since it was helping anxiety   Update some improved on wellbutrin 10-20% improved feels starting to work but not resolving symptoms yet   We decided against restarting fluoxetine or escitalopram for now. Due to ED   he adjusted his Endocrinology apt, they scheduled him sooner on 02/27/22 with [facility], saw Dr Honor Junes He was experiencing rush of energy couldn't focus sleep  pending adrenal metanephrine labs, they said the T4 mild elevation was not indication to change his dose yet.  Continues on Buspar for anxiety Asking about other med PRN if need      03/01/2022    3:50 PM 12/28/2021   11:04 AM 09/18/2020    1:43 PM  Depression screen PHQ 2/9  Decreased Interest 2 1 0  Down, Depressed, Hopeless 2 1 0  PHQ - 2 Score 4 2 0  Altered sleeping 3 2   Tired, decreased energy 1 2   Change in appetite 1 1   Feeling bad or failure about yourself  1 1   Trouble concentrating 2 1   Moving slowly or fidgety/restless 1 0   Suicidal thoughts 0 0   PHQ-9 Score 13 9   Difficult doing work/chores Very difficult Somewhat difficult       03/01/2022    3:50 PM 12/28/2021   11:04 AM 12/06/2019    8:59 AM 04/29/2019   11:47 AM  GAD 7 : Generalized Anxiety Score  Nervous, Anxious, on Edge '3 2 1 2  '$ Control/stop worrying '2 2 1 2  '$ Worry too much - different things '2 2 1 2  '$ Trouble relaxing '3 2 1 2  '$ Restless 2 1 0 1  Easily annoyed or irritable 3 2 0 1  Afraid - awful might happen 2 3 0 2  Total GAD 7 Score '17 14 4 12  '$ Anxiety Difficulty Somewhat difficult  Not  difficult at all Somewhat difficult     Social History   Tobacco Use   Smoking status: Never   Smokeless tobacco: Never  Vaping Use   Vaping Use: Never used  Substance Use Topics   Alcohol use: Yes    Comment: occasionally    Drug use: No    Review of Systems Per HPI unless specifically indicated above     Objective:    BP 128/88   Pulse 84   Ht '6\' 3"'$  (1.905 m)   Wt 298 lb 9.6 oz (135.4 kg)   SpO2 99%   BMI 37.32 kg/m   Wt Readings from Last 3 Encounters:  03/01/22 298 lb 9.6 oz (135.4 kg)  12/28/21 (!) 305 lb (138.3 kg)  12/14/20 (!) 310 lb (140.6 kg)    Physical Exam Vitals and nursing note reviewed.  Constitutional:      General: He is not in acute distress.    Appearance: Normal appearance. He is well-developed. He is not diaphoretic.     Comments: Well-appearing, comfortable, cooperative  HENT:     Head: Normocephalic and atraumatic.  Eyes:     General:  Right eye: No discharge.        Left eye: No discharge.     Conjunctiva/sclera: Conjunctivae normal.  Cardiovascular:     Rate and Rhythm: Normal rate.  Pulmonary:     Effort: Pulmonary effort is normal.  Skin:    General: Skin is warm and dry.     Findings: No erythema or rash.  Neurological:     Mental Status: He is alert and oriented to person, place, and time.  Psychiatric:        Mood and Affect: Mood normal.        Behavior: Behavior normal.        Thought Content: Thought content normal.     Comments: Well groomed, good eye contact, normal speech and thoughts      Results for orders placed or performed in visit on 12/28/21  Uric acid  Result Value Ref Range   Uric Acid 6.0 3.8 - 8.4 mg/dL  Hemoglobin A1c  Result Value Ref Range   Hgb A1c MFr Bld 10.2 (H) 4.8 - 5.6 %   Est. average glucose Bld gHb Est-mCnc 246 mg/dL  Lipid panel  Result Value Ref Range   Cholesterol, Total 278 (H) 100 - 199 mg/dL   Triglycerides 150 (H) 0 - 149 mg/dL   HDL 49 >39 mg/dL   VLDL Cholesterol  Cal 28 5 - 40 mg/dL   LDL Chol Calc (NIH) 201 (H) 0 - 99 mg/dL   Lipid Comment: Comment    Chol/HDL Ratio 5.7 (H) 0.0 - 5.0 ratio      Assessment & Plan:   Problem List Items Addressed This Visit     Anxiety - Primary   Relevant Medications   buPROPion (WELLBUTRIN XL) 300 MG 24 hr tablet   clonazePAM (KLONOPIN) 0.5 MG tablet   Other Visit Diagnoses     Moderate episode of recurrent major depressive disorder (HCC)       Relevant Medications   buPROPion (WELLBUTRIN XL) 300 MG 24 hr tablet       Finish the Wellbutrin XL 150 for remaining 1-2 weeks, if it works suddenly - then let me know and I can refill that dosage.  If it doesn't work as well, then pick up the new one. Wellbutrin XL '300mg'$  daily. 90 day ordered today  Continue Buspar  Given severity, will also add Clonazepam (generic Klonopin) 0.'5mg'$  1 tablet per dose, up to max 2 in a day, should be taken about 6-8 hours apart if you end up taking the 2nd dose. Detailed discussion on risks dependence withdrawal  F/u with Endocrine on the elevated T4 in future, if not improving and metanephrine lab.  Meds ordered this encounter  Medications   buPROPion (WELLBUTRIN XL) 300 MG 24 hr tablet    Sig: Take 1 tablet (300 mg total) by mouth daily.    Dispense:  90 tablet    Refill:  1   clonazePAM (KLONOPIN) 0.5 MG tablet    Sig: Take 1 tablet (0.5 mg total) by mouth 2 (two) times daily as needed for anxiety.    Dispense:  30 tablet    Refill:  2     Follow up plan: Return if symptoms worsen or fail to improve.   Nobie Putnam, [facility] 03/01/2022, 3:54 PM

## 2022-03-05 NOTE — Progress Notes (Unsigned)
03/06/2022 4:27 PM   Aaron Schwartz [birth year] [id number]  Referring provider: Olin Hauser, DO [address] [place],  [place] [address]  Urological history: 1. Testosterone deficiency -Contributing factors of age, obesity and diabetes -testosterone level pending -H & H level pending -testosterone gel 1.62%, 2 pumps daily  2. Pheochromocytoma -right adrenalectomy, 2020 -followed by endocrinology - last visit 01/2022  Chief Complaint  Patient presents with   Benign Prostatic Hypertrophy   Erectile Dysfunction    HPI: Aaron Schwartz is a 41 y.o. male who presents today for a yearly follow up.  He is having no energy, no libido and no drive.  Without the get for ~ one month.  He has no urinary complaints at this visit.  Patient denies any modifying or aggravating factors.  Patient denies any gross hematuria, dysuria or suprapubic/flank pain.  Patient denies any fevers, chills, nausea or vomiting.     IPSS     Row Name 03/06/22 1500         International Prostate Symptom Score   How often have you had the sensation of not emptying your bladder? Not at All     How often have you had to urinate less than every two hours? Less than half the time     How often have you found you stopped and started again several times when you urinated? Not at All     How often have you found it difficult to postpone urination? Not at All     How often have you had a weak urinary stream? Not at All     How often have you had to strain to start urination? Not at All     How many times did you typically get up at night to urinate? 1 Time     Total IPSS Score 3       Quality of Life due to urinary symptoms   If you were to spend the rest of your life with your urinary condition just the way it is now how would you feel about that? Delighted              Score:  1-7 Mild 8-19 Moderate 20-35 Severe   Patient still having spontaneous erections.  He denies any pain or  curvature with erections.  He has noticed a decrease in erections since he has been without the AndroGel.   SHIM     Row Name 03/06/22 1555         SHIM: Over the last 6 months:   How do you rate your confidence that you could get and keep an erection? Low     When you had erections with sexual stimulation, how often were your erections hard enough for penetration (entering your partner)? Sometimes (about half the time)     During sexual intercourse, how often were you able to maintain your erection after you had penetrated (entered) your partner? Sometimes (about half the time)     During sexual intercourse, how difficult was it to maintain your erection to completion of intercourse? Slightly Difficult     When you attempted sexual intercourse, how often was it satisfactory for you? A Few Times (much less than half the time)       SHIM Total Score   SHIM 14              Score: 1-7 Severe ED 8-11 Moderate ED 12-16 Mild-Moderate ED 17-21 Mild ED 22-25 No ED  PMH: Past Medical History:  Diagnosis Date   Allergy    Asthma    Depression    Hypertension     Surgical History: Past Surgical History:  Procedure Laterality Date   none      Home Medications:  Allergies as of 03/06/2022       Reactions   Gramineae Pollens    Mixed Grasses         Medication List        Accurate as of March 06, 2022  4:27 PM. If you have any questions, ask your nurse or doctor.          albuterol 108 (90 Base) MCG/ACT inhaler Commonly known as: VENTOLIN HFA TAKE 2 PUFFS BY MOUTH EVERY 6 HOURS AS NEEDED FOR WHEEZE OR SHORTNESS OF BREATH   allopurinol 100 MG tablet Commonly known as: ZYLOPRIM Take 2 tablets (200 mg total) by mouth daily.   atorvastatin 10 MG tablet Commonly known as: LIPITOR Take 1 tablet (10 mg total) by mouth at bedtime.   benzonatate 100 MG capsule Commonly known as: TESSALON Take 1 capsule (100 mg total) by mouth 3 (three) times daily as  needed.   buPROPion 300 MG 24 hr tablet Commonly known as: Wellbutrin XL Take 1 tablet (300 mg total) by mouth daily.   busPIRone 10 MG tablet Commonly known as: BUSPAR TAKE 1 TABLET (10 MG TOTAL) BY MOUTH 2 (TWO) TIMES DAILY. FOR ANXIETY   clonazePAM 0.5 MG tablet Commonly known as: KLONOPIN Take 1 tablet (0.5 mg total) by mouth 2 (two) times daily as needed for anxiety.   Farxiga 10 MG Tabs tablet Generic drug: dapagliflozin propanediol Take 1 tablet (10 mg total) by mouth daily before breakfast.   fluticasone 50 MCG/ACT nasal spray Commonly known as: FLONASE SPRAY 2 SPRAYS INTO EACH NOSTRIL EVERY DAY   ipratropium 0.03 % nasal spray Commonly known as: ATROVENT Place 2 sprays into both nostrils every 12 (twelve) hours.   Januvia 100 MG tablet Generic drug: sitaGLIPtin Take 1 tablet (100 mg total) by mouth daily.   levothyroxine 137 MCG tablet Commonly known as: SYNTHROID Take 274 mcg by mouth daily before breakfast. Per Endocrinology   lisinopril 20 MG tablet Commonly known as: ZESTRIL TAKE 1 TABLET BY MOUTH EVERY DAY   ofloxacin 0.3 % ophthalmic solution Commonly known as: OCUFLOX Place 2 drops into the right eye 4 (four) times daily.   sildenafil 20 MG tablet Commonly known as: REVATIO Take 1-5 pills about 30 min prior to sex. Start with 1 and increase as needed.   Taltz 80 MG/ML Soaj Generic drug: Ixekizumab Inject into the skin.   Testosterone 20.25 MG/ACT (1.62%) Gel APPLY 1 PUMP APPLIED TO EACH SHOULDER DAILY        Allergies:  Allergies  Allergen Reactions   Gramineae Pollens    Mixed Grasses     Family History: Family History  Problem Relation Age of Onset   Hypertension Mother    Thyroid cancer Mother     Social History:  reports that he has never smoked. He has never used smokeless tobacco. He reports current alcohol use. He reports that he does not use drugs.  ROS: Pertinent ROS in HPI  Physical Exam: BP 113/78   Pulse 87    Ht 6' 3" (1.905 m)   Wt 293 lb (132.9 kg)   BMI 36.62 kg/m   Constitutional:  Well nourished. Alert and oriented, No acute distress. HEENT: [place] AT, moist mucus membranes.  Trachea  midline Cardiovascular: No clubbing, cyanosis, or edema. Respiratory: Normal respiratory effort, no increased work of breathing. GI: Abdomen is soft, non tender, non distended, no abdominal masses. Liver and spleen not palpable.  No hernias appreciated.  Stool sample for occult testing is not indicated.   GU: No CVA tenderness.  No bladder fullness or masses.  Patient with uncircumcised phallus. Foreskin easily retracted  Urethral meatus is patent.  No penile discharge. No penile lesions or rashes. Scrotum without lesions, cysts, rashes and/or edema.  Testicles are located scrotally bilaterally. No masses are appreciated in the testicles. Left and right epididymis are normal.  Testicles are atrophic bilaterally. Rectal: Patient with  normal sphincter tone. Anus and perineum without scarring or rashes. No rectal masses are appreciated. Prostate is approximately 50 grams, I could only palpate the apex,  nodules are appreciated. Seminal vesicles could not be palpated. Neurologic: Grossly intact, no focal deficits, moving all 4 extremities. Psychiatric: Normal mood and affect.  Laboratory Data: Lab Results  Component Value Date   TESTOSTERONE 334 11/27/2021    Lab Results  Component Value Date   HGBA1C 10.2 (H) 01/07/2022       Component Value Date/Time   CHOL 278 (H) 01/07/2022 1010   HDL 49 01/07/2022 1010   CHOLHDL 5.7 (H) 01/07/2022 1010   CHOLHDL 4.7 03/31/2017 0857   VLDL 20 03/31/2017 0857   LDLCALC 201 (H) 01/07/2022 1010   Thyroid Stimulating Hormone (TSH) 0.450-5.330 uIU/ml uIU/mL 4.862   Resulting Agency  [facility]  Specimen Collected: 02/28/22 07:15 Last Resulted: 02/28/22 10:21  Received From: [facility]  Result Received: 03/01/22 15:26   Glucose 70 - 110  mg/dL 173 High    Sodium 136 - 145 mmol/L 134 Low    Potassium 3.6 - 5.1 mmol/L 4.5   Chloride 97 - 109 mmol/L 97   Carbon Dioxide (CO2) 22.0 - 32.0 mmol/L 26.9   Calcium 8.7 - 10.3 mg/dL 8.8   Urea Nitrogen (BUN) 7 - 25 mg/dL 19   Creatinine 0.7 - 1.3 mg/dL 1.4 High    Glomerular Filtration Rate (eGFR), MDRD Estimate >60 mL/min/1.73sq m 67   BUN/Crea Ratio 6.0 - 20.0 13.6   Anion Gap w/K 6.0 - 16.0 14.6   Resulting Agency  [facility]  Specimen Collected: 02/28/22 07:15 Last Resulted: 02/28/22 10:21  Received From: [facility]  Result Received: 03/01/22 15:26  I have reviewed the labs.   Pertinent Imaging: N/A  Assessment & Plan:    1. Testosterone deficiency  -testosterone levels are pending, but likely subtherapeutic as he has been without the gel for over a month -H & H pending -continue AndroGel 1.62% 2 pumps daily refill sent to pharmacy  2. BPH with LUTS -PSA pending -continue conservative management, avoiding bladder irritants and timed voiding's  3. Erectile dysfunction:    -We will reassess once therapeutic levels are achieved with TRT    Return in about 1 month (around 04/05/2022) for Testosterone only.  These notes generated with voice recognition software. I apologize for typographical errors.  Zara Council, PA-C  [facility] [address]  [place], [place] [address] [phone]

## 2022-03-06 ENCOUNTER — Ambulatory Visit: Payer: BC Managed Care – PPO | Admitting: Urology

## 2022-03-06 ENCOUNTER — Encounter: Payer: Self-pay | Admitting: Urology

## 2022-03-06 VITALS — BP 113/78 | HR 87 | Ht 75.0 in | Wt 293.0 lb

## 2022-03-06 DIAGNOSIS — N529 Male erectile dysfunction, unspecified: Secondary | ICD-10-CM | POA: Diagnosis not present

## 2022-03-06 DIAGNOSIS — E291 Testicular hypofunction: Secondary | ICD-10-CM

## 2022-03-06 DIAGNOSIS — N401 Enlarged prostate with lower urinary tract symptoms: Secondary | ICD-10-CM

## 2022-03-06 MED ORDER — TESTOSTERONE 20.25 MG/ACT (1.62%) TD GEL: TRANSDERMAL | 3 refills | Status: DC

## 2022-03-07 LAB — PSA: Prostate Specific Ag, Serum: 0.6 ng/mL (ref 0.0–4.0)

## 2022-03-07 LAB — TESTOSTERONE: Testosterone: 306 ng/dL (ref 264–916)

## 2022-03-07 LAB — HEMOGLOBIN AND HEMATOCRIT, BLOOD
Hematocrit: 47.9 % (ref 37.5–51.0)
Hemoglobin: 16.2 g/dL (ref 13.0–17.7)

## 2022-03-29 ENCOUNTER — Encounter: Payer: Self-pay | Admitting: Internal Medicine

## 2022-03-29 ENCOUNTER — Ambulatory Visit: Payer: BC Managed Care – PPO | Admitting: Internal Medicine

## 2022-03-29 ENCOUNTER — Ambulatory Visit: Payer: BC Managed Care – PPO | Admitting: Family Medicine

## 2022-03-29 VITALS — BP 136/82 | HR 82 | Temp 96.6°F | Ht 75.0 in | Wt 300.0 lb

## 2022-03-29 DIAGNOSIS — Z0289 Encounter for other administrative examinations: Secondary | ICD-10-CM

## 2022-03-29 NOTE — Progress Notes (Signed)
Commercial Driver Medical Examination   Aaron Schwartz is a 41 y.o. male who presents today for a commercial driver fitness determination physical exam. The patient reports no problems. The following portions of the patient's history were reviewed and updated as appropriate: allergies, current medications, past family history, past medical history, past social history, past surgical history, and problem list.  He has a history of HTN, HLD, DM2.  His last A1c was 10.2%, 12/2021.  Review of Systems Pertinent items are noted in HPI.    Past Medical History:  Diagnosis Date   Allergy    Asthma    Depression    Hypertension     Current Outpatient Medications  Medication Sig Dispense Refill   albuterol (VENTOLIN HFA) 108 (90 Base) MCG/ACT inhaler TAKE 2 PUFFS BY MOUTH EVERY 6 HOURS AS NEEDED FOR WHEEZE OR SHORTNESS OF BREATH 8.5 each 4   allopurinol (ZYLOPRIM) 100 MG tablet Take 2 tablets (200 mg total) by mouth daily. 180 tablet 3   atorvastatin (LIPITOR) 10 MG tablet Take 1 tablet (10 mg total) by mouth at bedtime. 90 tablet 3   buPROPion (WELLBUTRIN XL) 300 MG 24 hr tablet Take 1 tablet (300 mg total) by mouth daily. 90 tablet 1   busPIRone (BUSPAR) 10 MG tablet TAKE 1 TABLET (10 MG TOTAL) BY MOUTH 2 (TWO) TIMES DAILY. FOR ANXIETY 180 tablet 1   clonazePAM (KLONOPIN) 0.5 MG tablet Take 1 tablet (0.5 mg total) by mouth 2 (two) times daily as needed for anxiety. 30 tablet 2   FARXIGA 10 MG TABS tablet Take 1 tablet (10 mg total) by mouth daily before breakfast. 90 tablet 3   fluticasone (FLONASE) 50 MCG/ACT nasal spray SPRAY 2 SPRAYS INTO EACH NOSTRIL EVERY DAY 48 mL 1   JANUVIA 100 MG tablet Take 1 tablet (100 mg total) by mouth daily. 30 tablet 2   levothyroxine (SYNTHROID) 137 MCG tablet Take 274 mcg by mouth daily before breakfast. Per Endocrinology     lisinopril (ZESTRIL) 20 MG tablet TAKE 1 TABLET BY MOUTH EVERY DAY 90 tablet 3   sildenafil (REVATIO) 20 MG tablet Take 1-5 pills  about 30 min prior to sex. Start with 1 and increase as needed. 90 tablet 3   TALTZ 80 MG/ML SOAJ Inject into the skin.     Testosterone 20.25 MG/ACT (1.62%) GEL APPLY 1 PUMP APPLIED TO EACH SHOULDER DAILY 75 g 3   benzonatate (TESSALON) 100 MG capsule Take 1 capsule (100 mg total) by mouth 3 (three) times daily as needed. 30 capsule 0   ipratropium (ATROVENT) 0.03 % nasal spray Place 2 sprays into both nostrils every 12 (twelve) hours. (Patient not taking: Reported on 03/06/2022) 30 mL 0   ofloxacin (OCUFLOX) 0.3 % ophthalmic solution Place 2 drops into the right eye 4 (four) times daily. (Patient not taking: Reported on 03/06/2022) 10 mL 0   No current facility-administered medications for this visit.    Allergies  Allergen Reactions   Gramineae Pollens    Mixed Grasses     Family History  Problem Relation Age of Onset   Hypertension Mother    Thyroid cancer Mother     Social History   Socioeconomic History   Marital status: Married    Spouse name: Not on file   Number of children: Not on file   Years of education: Not on file   Highest education level: Not on file  Occupational History   Occupation: Football Coach    Comment: [employer]  Tobacco Use   Smoking status: Never   Smokeless tobacco: Never  Vaping Use   Vaping Use: Never used  Substance and Sexual Activity   Alcohol use: Yes    Comment: occasionally    Drug use: No   Sexual activity: Not on file  Other Topics Concern   Not on file  Social History Narrative   Not on file   Social Determinants of Health   Financial Resource Strain: Not on file  Food Insecurity: Not on file  Transportation Needs: Not on file  Physical Activity: Not on file  Stress: Not on file  Social Connections: Not on file  Intimate Partner Violence: Not on file     Constitutional: Denies fever, malaise, fatigue, headache or abrupt weight changes.  HEENT: Denies eye pain, eye redness, ear pain, ringing in the ears, wax buildup,  runny nose, nasal congestion, bloody nose, or sore throat. Respiratory: Denies difficulty breathing, shortness of breath, cough or sputum production.   Cardiovascular: Denies chest pain, chest tightness, palpitations or swelling in the hands or feet.  Gastrointestinal: Denies abdominal pain, bloating, constipation, diarrhea or blood in the stool.  GU: Denies urgency, frequency, pain with urination, burning sensation, blood in urine, odor or discharge. Musculoskeletal: Denies decrease in range of motion, difficulty with gait, muscle pain or joint pain and swelling.  Skin: Denies redness, rashes, lesions or ulcercations.  Neurological: Denies dizziness, difficulty with memory, difficulty with speech or problems with balance and coordination.  Psych: Patient has a history of anxiety.  Denies depression, SI/HI.  No other specific complaints in a complete review of systems (except as listed in HPI above).  Objective:    Vision:  Uncorrected Corrected Horizontal Field of Vision  Right Eye 20/10  > 70  degrees  Left Eye  20/13  > 70 degrees  Both Eyes  34/19     Applicant can recognize and distinguish among traffic control signals and devices showing standard red, green, and amber colors.     Monocular Vision?: No   Hearing:        Right Ear  > 5 ft     Left Ear  >5 ft      BP 136/82 (BP Location: Left Arm, Patient Position: Sitting, Cuff Size: Large)   Pulse 82   Temp (!) 96.6 F (35.9 C) (Temporal)   Ht '6\' 3"'$  (1.905 m)   Wt 300 lb (136.1 kg)   SpO2 99%   BMI 37.50 kg/m  Wt Readings from Last 3 Encounters:  03/29/22 300 lb (136.1 kg)  03/06/22 293 lb (132.9 kg)  03/01/22 298 lb 9.6 oz (135.4 kg)    General: Appears his stated age, obese, in NAD. Skin: Warm, dry and intact. No ulcerations noted. HEENT: Head: normal shape and size; Eyes: sclera white, no icterus, conjunctiva pink, PERRLA and EOMs intact; Ears: Tm's gray and intact, normal light reflex;  Cardiovascular:  Normal rate and rhythm. S1,S2 noted.  No murmur, rubs or gallops noted. No JVD or BLE edema.  Pulmonary/Chest: Normal effort and positive vesicular breath sounds. No respiratory distress. No wheezes, rales or ronchi noted.  Abdomen: Soft and nontender. Normal bowel sounds. No distention or masses noted.  Musculoskeletal: Normal range of motion.  Strength 5/5 BUE/BLE.  No difficulty with gait.  Neurological: Alert and oriented. Cranial nerves II-XII grossly intact. Coordination normal.  Psychiatric: Mood and affect normal. Behavior is normal. Judgment and thought content normal.    BMET    Component Value Date/Time  NA 138 11/12/2020 0000   K 5.4 (A) 11/12/2020 0000   CL 100 11/12/2020 0000   CO2 21 11/12/2020 0000   GLUCOSE 135 (H) 09/19/2020 0944   GLUCOSE 163 (H) 04/26/2019 0828   BUN 16 11/12/2020 0000   CREATININE 1.4 (A) 11/12/2020 0000   CREATININE 1.37 (H) 09/19/2020 0944   CREATININE 1.12 04/26/2019 0828   CALCIUM 9.1 11/12/2020 0000   GFRNONAA 64 09/19/2020 0944   GFRNONAA 82 04/26/2019 0828   GFRAA 74 09/19/2020 0944   GFRAA 95 04/26/2019 0828    Lipid Panel     Component Value Date/Time   CHOL 278 (H) 01/07/2022 1010   TRIG 150 (H) 01/07/2022 1010   HDL 49 01/07/2022 1010   CHOLHDL 5.7 (H) 01/07/2022 1010   CHOLHDL 4.7 03/31/2017 0857   VLDL 20 03/31/2017 0857   LDLCALC 201 (H) 01/07/2022 1010    CBC    Component Value Date/Time   WBC 4.0 11/12/2020 0000   WBC 4.4 03/31/2017 0857   RBC 5.26 (A) 11/12/2020 0000   HGB 16.2 03/06/2022 1534   HCT 47.9 03/06/2022 1534   PLT 337 11/12/2020 0000   PLT 322 09/19/2020 0944   MCV 89 09/19/2020 0944   MCH 29.1 09/19/2020 0944   MCH 28.9 03/31/2017 0857   MCHC 32.9 09/19/2020 0944   MCHC 32.5 03/31/2017 0857   RDW 13.3 09/19/2020 0944   LYMPHSABS 1.6 09/19/2020 0944   MONOABS 352 03/31/2017 0857   EOSABS 0.2 09/19/2020 0944   BASOSABS 0.0 09/19/2020 0944    Hgb A1C Lab Results  Component Value Date    HGBA1C 10.2 (H) 01/07/2022        Labs:  Urinalysis: Specific Gravity: 1.000, Protein: Negative, Glucose 1000, Blood: Negative  Assessment:    Healthy male exam.  Temporarily disqualified due to elevated  A1C of 10.2%..  Return to medical examiner's office for follow up on 04/08/22.    Plan:    Need follow-up in 20 days   for recheck of A1C.     Webb Silversmith, NP

## 2022-04-01 ENCOUNTER — Other Ambulatory Visit: Payer: BC Managed Care – PPO

## 2022-04-01 DIAGNOSIS — E291 Testicular hypofunction: Secondary | ICD-10-CM

## 2022-04-04 ENCOUNTER — Telehealth: Payer: Self-pay

## 2022-04-04 DIAGNOSIS — E291 Testicular hypofunction: Secondary | ICD-10-CM

## 2022-04-04 LAB — TESTOSTERONE: Testosterone: 346 ng/dL (ref 264–916)

## 2022-04-04 NOTE — Telephone Encounter (Signed)
Patient notified he states he has been on for 30 days. He does not feel like this dose is working for him.

## 2022-04-04 NOTE — Telephone Encounter (Signed)
-----   Message from Nori Riis, PA-C sent at 04/04/2022  8:04 AM EDT ----- Please let Aaron Schwartz know that his testosterone level is subtherapeutic.  Has he been on the gel for 30 days?

## 2022-04-05 ENCOUNTER — Other Ambulatory Visit: Payer: BC Managed Care – PPO

## 2022-04-05 MED ORDER — TESTOSTERONE 20.25 MG/ACT (1.62%) TD GEL: TRANSDERMAL | 3 refills | Status: DC

## 2022-04-05 NOTE — Telephone Encounter (Signed)
Per Larene Beach:  We will need to increase the dose to three pumps daily and then retest his testosterone levels in one month   Patient notified and lab apt made, new script needs to be sent into pharmacy order pended

## 2022-04-06 ENCOUNTER — Other Ambulatory Visit: Payer: Self-pay | Admitting: Family Medicine

## 2022-04-06 DIAGNOSIS — E1169 Type 2 diabetes mellitus with other specified complication: Secondary | ICD-10-CM

## 2022-04-08 ENCOUNTER — Ambulatory Visit (INDEPENDENT_AMBULATORY_CARE_PROVIDER_SITE_OTHER): Payer: BC Managed Care – PPO

## 2022-04-08 ENCOUNTER — Ambulatory Visit: Payer: BC Managed Care – PPO

## 2022-04-08 DIAGNOSIS — E1169 Type 2 diabetes mellitus with other specified complication: Secondary | ICD-10-CM

## 2022-04-08 LAB — POCT GLYCOSYLATED HEMOGLOBIN (HGB A1C): Hemoglobin A1C: 9.2 % — AB (ref 4.0–5.6)

## 2022-04-08 NOTE — Telephone Encounter (Signed)
Requested Prescriptions  Pending Prescriptions Disp Refills  . JANUVIA 100 MG tablet [Pharmacy Med Name: JANUVIA 100 MG TABLET] 30 tablet 2    Sig: TAKE 1 TABLET BY [place] DAY     Endocrinology:  Diabetes - DPP-4 Inhibitors Failed - 04/06/2022 11:15 AM      Failed - HBA1C is between 0 and 7.9 and within 180 days    Hgb A1c MFr Bld  Date Value Ref Range Status  01/07/2022 10.2 (H) 4.8 - 5.6 % Final    Comment:    **Verified by repeat analysis**          Prediabetes: 5.7 - 6.4          Diabetes: >6.4          Glycemic control for adults with diabetes: <7.0          Failed - Cr in normal range and within 360 days    Creatinine  Date Value Ref Range Status  11/12/2020 1.4 (A) 0.6 - 1.3 Final   Creat  Date Value Ref Range Status  04/26/2019 1.12 0.60 - 1.35 mg/dL Final   Creatinine, Ser  Date Value Ref Range Status  09/19/2020 1.37 (H) 0.76 - 1.27 mg/dL Final         Passed - Valid encounter within last 6 months    Recent Outpatient Visits          1 week ago Encounter for examination required by Department of Transportation (DOT)   [facility], Coralie Keens, NP   1 month ago [place], DO   3 months ago Type 2 diabetes mellitus with other specified complication, without long-term current use of insulin [facility])   [place], Devonne Doughty, DO   1 year ago Annual physical exam   [place], DO   1 year ago Bacterial conjunctivitis of right eye   [facility], Lupita Raider, [place]

## 2022-04-11 ENCOUNTER — Telehealth: Payer: Self-pay

## 2022-04-11 NOTE — Telephone Encounter (Signed)
Copied from [place] [id number]. 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  Please advise.

## 2022-04-15 NOTE — Progress Notes (Signed)
Completed, will place up front with Apolonio Schneiders for pt to pick up.

## 2022-04-16 NOTE — Telephone Encounter (Signed)
Pt called to get an update on DOT physical card, spoke to front office staff and confirmed forms were ready. Advised pt he could pick them up.

## 2022-04-23 ENCOUNTER — Other Ambulatory Visit: Payer: Self-pay | Admitting: Urology

## 2022-04-23 DIAGNOSIS — E291 Testicular hypofunction: Secondary | ICD-10-CM

## 2022-04-23 MED ORDER — TESTOSTERONE 20.25 MG/ACT (1.62%) TD GEL: TRANSDERMAL | 3 refills | Status: DC

## 2022-05-03 ENCOUNTER — Other Ambulatory Visit: Payer: BC Managed Care – PPO

## 2022-05-03 DIAGNOSIS — E291 Testicular hypofunction: Secondary | ICD-10-CM

## 2022-05-04 LAB — TESTOSTERONE: Testosterone: 150 ng/dL — ABNORMAL LOW (ref 264–916)

## 2022-05-08 ENCOUNTER — Telehealth: Payer: Self-pay

## 2022-05-08 NOTE — Telephone Encounter (Signed)
Pt states his Rx was messed up and he wasn't able to pick it up on time so he had ran out. Pt goes on to say he is thinking of switching to injections because applying the gel 3x daily isnt working with his schedule. 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 Pt states he will call back to schedule appt.

## 2022-05-08 NOTE — Telephone Encounter (Signed)
-----   Message from Despina Hidden, [place] sent at 05/08/2022  8:29 AM EDT -----  ----- Message ----- From: Debroah Loop, PA-C Sent: 05/07/2022   4:37 PM EDT To: Despina Hidden, CMA  His testosterone came back low again. Is he off androgel?

## 2022-05-09 ENCOUNTER — Ambulatory Visit: Payer: BC Managed Care – PPO

## 2022-05-13 NOTE — Progress Notes (Unsigned)
05/14/2022 9:44 PM   Aaron Schwartz 1981/03/25 [id number]  Referring provider: Olin Hauser, DO [address] [place],  [place] [address]  Urological history: 1. Testosterone deficiency -Contributing factors of age, obesity and diabetes -testosterone, 04/2022 - 150 -Hemoglobin/hematocrit, 02/2022 16.2/47.9  2. Pheochromocytoma -right adrenalectomy, 2020 -followed by endocrinology - last visit 01/2022  3. BPH with LU TS -PSA, 02/2022 -I PSS ***  4. ED -contributing factors of age, BPH, diabetes, HTN, HLD, depression, anxiety, testosterone deficiency, COPDand alcohol consumption -SHIM *** -sildenafil 20 mg, on-demand-dosing  No chief complaint on file.   HPI: Aaron Schwartz is a 41 y.o. male who presents today to discuss other forms of testosterone therapy.    He has not been able to achieve therapeutic levels with topical gels.        Score:  1-7 Mild 8-19 Moderate 20-35 Severe       Score: 1-7 Severe ED 8-11 Moderate ED 12-16 Mild-Moderate ED 17-21 Mild ED 22-25 No ED       PMH: Past Medical History:  Diagnosis Date   Allergy    Asthma    Depression    Hypertension     Surgical History: Past Surgical History:  Procedure Laterality Date   none      Home Medications:  Allergies as of 05/14/2022       Reactions   Gramineae Pollens    Mixed Grasses         Medication List        Accurate as of May 13, 2022  9:44 PM. If you have any questions, ask your nurse or doctor.          albuterol 108 (90 Base) MCG/ACT inhaler Commonly known as: VENTOLIN HFA TAKE 2 PUFFS BY MOUTH EVERY 6 HOURS AS NEEDED FOR WHEEZE OR SHORTNESS OF BREATH   allopurinol 100 MG tablet Commonly known as: ZYLOPRIM Take 2 tablets (200 mg total) by mouth daily.   atorvastatin 10 MG tablet Commonly known as: LIPITOR Take 1 tablet (10 mg total) by mouth at bedtime.   benzonatate 100 MG capsule Commonly known as: TESSALON Take 1 capsule (100  mg total) by mouth 3 (three) times daily as needed.   buPROPion 300 MG 24 hr tablet Commonly known as: Wellbutrin XL Take 1 tablet (300 mg total) by mouth daily.   busPIRone 10 MG tablet Commonly known as: BUSPAR TAKE 1 TABLET (10 MG TOTAL) BY MOUTH 2 (TWO) TIMES DAILY. FOR ANXIETY   clonazePAM 0.5 MG tablet Commonly known as: KLONOPIN Take 1 tablet (0.5 mg total) by mouth 2 (two) times daily as needed for anxiety.   Farxiga 10 MG Tabs tablet Generic drug: dapagliflozin propanediol Take 1 tablet (10 mg total) by mouth daily before breakfast.   fluticasone 50 MCG/ACT nasal spray Commonly known as: FLONASE SPRAY 2 SPRAYS INTO EACH NOSTRIL EVERY DAY   ipratropium 0.03 % nasal spray Commonly known as: ATROVENT Place 2 sprays into both nostrils every 12 (twelve) hours.   Januvia 100 MG tablet Generic drug: sitaGLIPtin TAKE 1 TABLET BY MOUTH EVERY DAY   levothyroxine 137 MCG tablet Commonly known as: SYNTHROID Take 274 mcg by mouth daily before breakfast. Per Endocrinology   lisinopril 20 MG tablet Commonly known as: ZESTRIL TAKE 1 TABLET BY MOUTH EVERY DAY   ofloxacin 0.3 % ophthalmic solution Commonly known as: OCUFLOX Place 2 drops into the right eye 4 (four) times daily.   sildenafil 20 MG tablet Commonly known as: REVATIO Take  1-5 pills about 30 min prior to sex. Start with 1 and increase as needed.   Taltz 80 MG/ML [place] Generic drug: Ixekizumab Inject into the skin.   Testosterone 20.25 MG/ACT (1.62%) Gel APPLY 3 PUMPS TO SHOULDERS DAILY        Allergies:  Allergies  Allergen Reactions   Gramineae Pollens    Mixed Grasses     Family History: Family History  Problem Relation Age of Onset   Hypertension Mother    Thyroid cancer Mother     Social History:  reports that he has never smoked. He has never used smokeless tobacco. He reports current alcohol use. He reports that he does not use drugs.  ROS: Pertinent ROS in HPI  Physical Exam: There  were no vitals taken for this visit.  Constitutional:  Well nourished. Alert and oriented, No acute distress. HEENT: [place] AT, moist mucus membranes.  Trachea midline Cardiovascular: No clubbing, cyanosis, or edema. Respiratory: Normal respiratory effort, no increased work of breathing. GU: No CVA tenderness.  No bladder fullness or masses.  Patient with circumcised/uncircumcised phallus. ***Foreskin easily retracted***  Urethral meatus is patent.  No penile discharge. No penile lesions or rashes. Scrotum without lesions, cysts, rashes and/or edema.  Testicles are located scrotally bilaterally. No masses are appreciated in the testicles. Left and right epididymis are normal. Rectal: Patient with  normal sphincter tone. Anus and perineum without scarring or rashes. No rectal masses are appreciated. Prostate is approximately *** grams, *** nodules are appreciated. Seminal vesicles are normal. Neurologic: Grossly intact, no focal deficits, moving all 4 extremities. Psychiatric: Normal mood and affect.   Laboratory Data: Lab Results  Component Value Date   TESTOSTERONE 150 (L) 05/03/2022    Lab Results  Component Value Date   HGBA1C 9.2 (A) 04/08/2022  I have reviewed the labs.   Pertinent Imaging N/A  Assessment & Plan:    1. Testosterone deficiency  -***  2. BPH with LUTS -PSA stable -DRE benign -UA benign -PVR < 300 cc -symptoms - *** -most bothersome symptoms are *** -continue conservative management, avoiding bladder irritants and timed voiding's -Initiate alpha-blocker (***), discussed side effects *** -Initiate 5 alpha reductase inhibitor (***), discussed side effects *** -Continue tamsulosin 0.4 mg daily, alfuzosin 10 mg daily, Rapaflo 8 mg daily, terazosin, doxazosin, Cialis 5 mg daily and finasteride 5 mg daily, dutasteride 0.5 mg daily***:refills given -Cannot tolerate medication or medication failure, schedule cystoscopy ***   3. Erectile dysfunction:    -We will  reassess once therapeutic levels are achieved with TRT    No follow-ups on file.  These notes generated with voice recognition software. I apologize for typographical errors.  Zara Council, PA-C  [facility] [address]  [place], [place] [address] [phone]

## 2022-05-14 ENCOUNTER — Ambulatory Visit: Payer: BC Managed Care – PPO | Admitting: Urology

## 2022-05-14 ENCOUNTER — Encounter: Payer: Self-pay | Admitting: Urology

## 2022-05-14 VITALS — BP 125/80 | HR 79 | Ht 75.0 in | Wt 297.0 lb

## 2022-05-14 DIAGNOSIS — E291 Testicular hypofunction: Secondary | ICD-10-CM

## 2022-05-14 DIAGNOSIS — N529 Male erectile dysfunction, unspecified: Secondary | ICD-10-CM | POA: Diagnosis not present

## 2022-05-14 DIAGNOSIS — N138 Other obstructive and reflux uropathy: Secondary | ICD-10-CM

## 2022-05-14 DIAGNOSIS — N401 Enlarged prostate with lower urinary tract symptoms: Secondary | ICD-10-CM

## 2022-05-14 DIAGNOSIS — E349 Endocrine disorder, unspecified: Secondary | ICD-10-CM

## 2022-05-14 MED ORDER — "BD DISP NEEDLES 18G X 1-1/2"" MISC": 1.0000 mg | 0 refills | Status: DC

## 2022-05-14 MED ORDER — SYRINGE 2-3 ML 3 ML MISC: 1.0000 mg | 3 refills | Status: DC

## 2022-05-14 MED ORDER — "BD DISP NEEDLES 21G X 1-1/2"" MISC": 1.0000 mg | 0 refills | Status: DC

## 2022-05-14 MED ORDER — TESTOSTERONE CYPIONATE 200 MG/ML IM SOLN: INTRAMUSCULAR | 0 refills | Status: DC

## 2022-05-30 ENCOUNTER — Other Ambulatory Visit: Payer: Self-pay | Admitting: Family Medicine

## 2022-05-30 DIAGNOSIS — J4520 Mild intermittent asthma, uncomplicated: Secondary | ICD-10-CM

## 2022-05-30 NOTE — Telephone Encounter (Signed)
Requested Prescriptions  Pending Prescriptions Disp Refills  . albuterol (VENTOLIN HFA) 108 (90 Base) MCG/ACT inhaler [Pharmacy Med Name: ALBUTEROL HFA (PROAIR) INHALER] 8.5 each 4    Sig: TAKE 2 PUFFS BY MOUTH EVERY 6 HOURS AS NEEDED FOR WHEEZE OR SHORTNESS OF BREATH     Pulmonology:  Beta Agonists 2 Passed - 05/30/2022  2:50 AM      Passed - Last BP in normal range    BP Readings from Last 1 Encounters:  05/14/22 125/80         Passed - Last Heart Rate in normal range    Pulse Readings from Last 1 Encounters:  05/14/22 79         Passed - Valid encounter within last 12 months    Recent Outpatient Visits          2 months ago Encounter for examination required by Department of Transportation (DOT)   [facility], Coralie Keens, NP   3 months ago [place], DO   5 months ago Type 2 diabetes mellitus with other specified complication, without long-term current use of insulin [facility])   [facility] [place], Devonne Doughty, DO   1 year ago Annual physical exam   [place], DO   2 years ago Bacterial conjunctivitis of right eye   [facility], Lupita Raider, [place]

## 2022-06-15 ENCOUNTER — Other Ambulatory Visit: Payer: Self-pay | Admitting: Family Medicine

## 2022-06-15 DIAGNOSIS — J209 Acute bronchitis, unspecified: Secondary | ICD-10-CM

## 2022-06-15 DIAGNOSIS — J302 Other seasonal allergic rhinitis: Secondary | ICD-10-CM

## 2022-06-17 NOTE — Telephone Encounter (Signed)
Requested Prescriptions  Pending Prescriptions Disp Refills  . fluticasone (FLONASE) 50 MCG/ACT nasal spray [Pharmacy Med Name: FLUTICASONE PROP 50 MCG SPRAY] 48 mL 1    Sig: SPRAY 2 SPRAYS INTO EACH NOSTRIL EVERY DAY     Ear, Nose, and Throat: Nasal Preparations - Corticosteroids Passed - 06/15/2022 11:06 AM      Passed - Valid encounter within last 12 months    Recent Outpatient Visits          2 months ago Encounter for examination required by Department of Transportation (DOT)   [facility], Coralie Keens, NP   3 months ago [place], DO   5 months ago Type 2 diabetes mellitus with other specified complication, without long-term current use of insulin [facility])   [facility] [place], Devonne Doughty, DO   1 year ago Annual physical exam   [place], DO   2 years ago Bacterial conjunctivitis of right eye   [facility], Lupita Raider, [place]

## 2022-06-21 ENCOUNTER — Other Ambulatory Visit: Payer: Self-pay | Admitting: *Deleted

## 2022-06-21 DIAGNOSIS — E349 Endocrine disorder, unspecified: Secondary | ICD-10-CM

## 2022-06-21 DIAGNOSIS — E291 Testicular hypofunction: Secondary | ICD-10-CM

## 2022-06-24 ENCOUNTER — Telehealth: Payer: Self-pay | Admitting: Urology

## 2022-06-24 ENCOUNTER — Other Ambulatory Visit: Payer: BC Managed Care – PPO

## 2022-06-24 NOTE — Telephone Encounter (Signed)
Pt hasn't started testosterone because [facility] says we need to contact his insurance co.  I'm thinking maybe a PA?

## 2022-06-27 NOTE — Telephone Encounter (Signed)
Patient notified Testosterone cypionate has been approved Approval date: 06/26/2022-06/26/2025

## 2022-07-04 ENCOUNTER — Other Ambulatory Visit: Payer: Self-pay | Admitting: Family Medicine

## 2022-07-04 DIAGNOSIS — E1169 Type 2 diabetes mellitus with other specified complication: Secondary | ICD-10-CM

## 2022-07-04 NOTE — Telephone Encounter (Signed)
Requested Prescriptions  Pending Prescriptions Disp Refills  . JANUVIA 100 MG tablet [Pharmacy Med Name: JANUVIA 100 MG TABLET] 30 tablet 2    Sig: TAKE 1 TABLET BY [place] DAY     Endocrinology:  Diabetes - DPP-4 Inhibitors Failed - 07/04/2022  2:49 AM      Failed - HBA1C is between 0 and 7.9 and within 180 days    Hemoglobin A1C  Date Value Ref Range Status  04/08/2022 9.2 (A) 4.0 - 5.6 % Final   Hgb A1c MFr Bld  Date Value Ref Range Status  01/07/2022 10.2 (H) 4.8 - 5.6 % Final    Comment:    **Verified by repeat analysis**          Prediabetes: 5.7 - 6.4          Diabetes: >6.4          Glycemic control for adults with diabetes: <7.0          Failed - Cr in normal range and within 360 days    Creatinine  Date Value Ref Range Status  11/12/2020 1.4 (A) 0.6 - 1.3 Final   Creat  Date Value Ref Range Status  04/26/2019 1.12 0.60 - 1.35 mg/dL Final   Creatinine, Ser  Date Value Ref Range Status  09/19/2020 1.37 (H) 0.76 - 1.27 mg/dL Final         Passed - Valid encounter within last 6 months    Recent Outpatient Visits          3 months ago Encounter for examination required by Department of Transportation (DOT)   [facility], Coralie Keens, NP   4 months ago [place], DO   6 months ago Type 2 diabetes mellitus with other specified complication, without long-term current use of insulin [facility])   [place], Devonne Doughty, DO   1 year ago Annual physical exam   [place], DO   2 years ago Bacterial conjunctivitis of right eye   [facility], Lupita Raider, [place]

## 2022-07-15 ENCOUNTER — Other Ambulatory Visit: Payer: Self-pay | Admitting: Family Medicine

## 2022-07-15 DIAGNOSIS — F419 Anxiety disorder, unspecified: Secondary | ICD-10-CM

## 2022-07-16 NOTE — Telephone Encounter (Signed)
Requested Prescriptions  Pending Prescriptions Disp Refills  . busPIRone (BUSPAR) 10 MG tablet [Pharmacy Med Name: BUSPIRONE HCL 10 MG TABLET] 180 tablet 2    Sig: TAKE 1 TABLET (10 MG TOTAL) BY MOUTH 2 (TWO) TIMES DAILY. FOR ANXIETY     Psychiatry: Anxiolytics/Hypnotics - Non-controlled Passed - 07/15/2022  1:38 AM      Passed - Valid encounter within last 12 months    Recent Outpatient Visits          3 months ago Encounter for examination required by Department of Transportation (DOT)   [facility], Coralie Keens, NP   4 months ago [place], DO   6 months ago Type 2 diabetes mellitus with other specified complication, without long-term current use of insulin [facility])   [facility] Olin Hauser, DO   1 year ago Annual physical exam   [place], DO   2 years ago Bacterial conjunctivitis of right eye   [facility], Lupita Raider, [place]

## 2022-08-05 IMAGING — US US THYROID
1 series · 14 of 25 positions shown · non-contrast
Comparison: None.

CLINICAL DATA: Medullary thyroid carcinoma, post thyroidectomy

EXAM:
THYROID ULTRASOUND
TECHNIQUE: Ultrasound examination of the thyroidectomy bed and adjacent soft
tissues was performed.

[Series 1: us thyroid · 0.07mm/px · 14 of 27 slices shown]
[im 1/27]
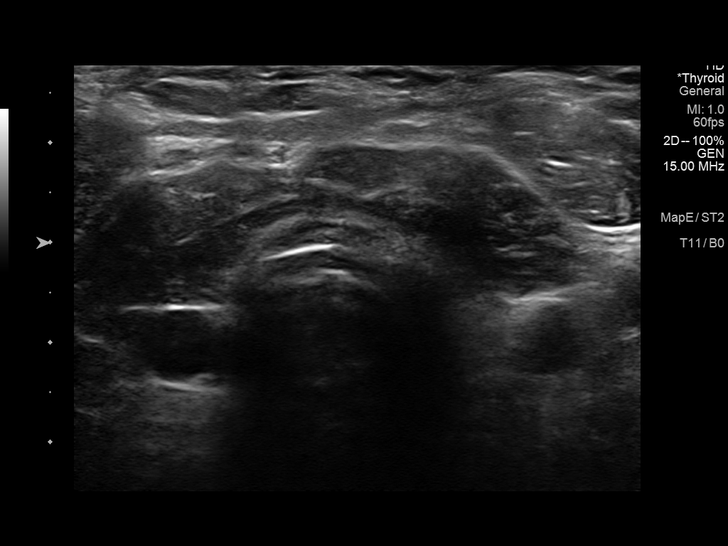
[im 3/27]
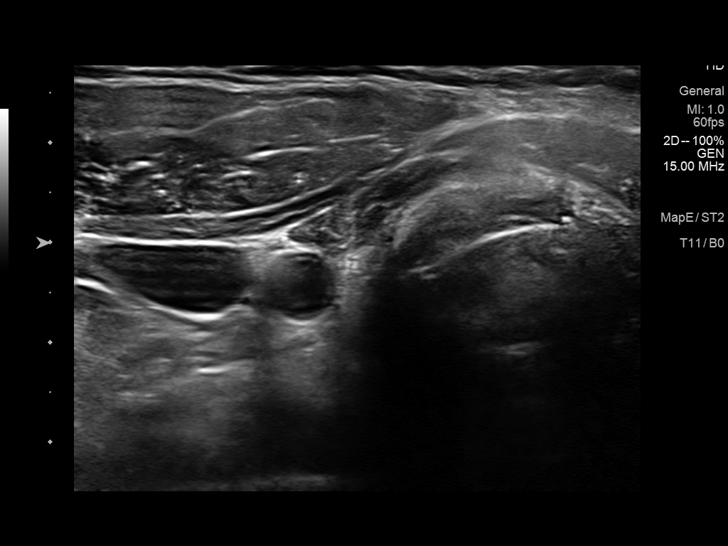
[im 5/27]
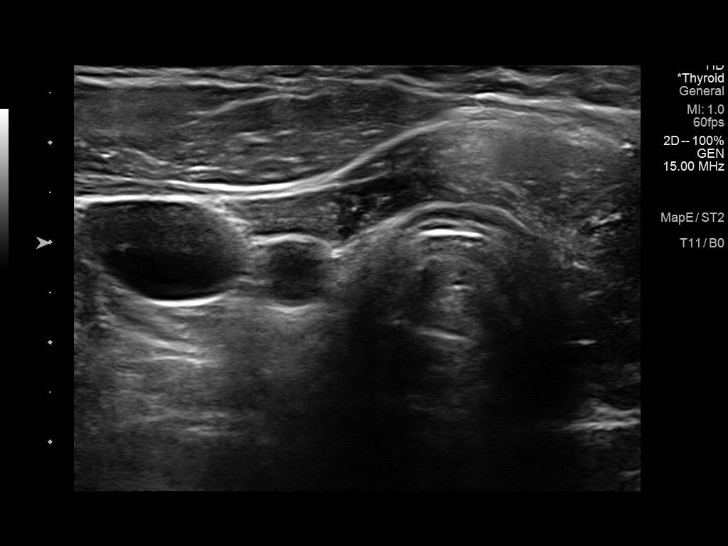
[im 7/27]
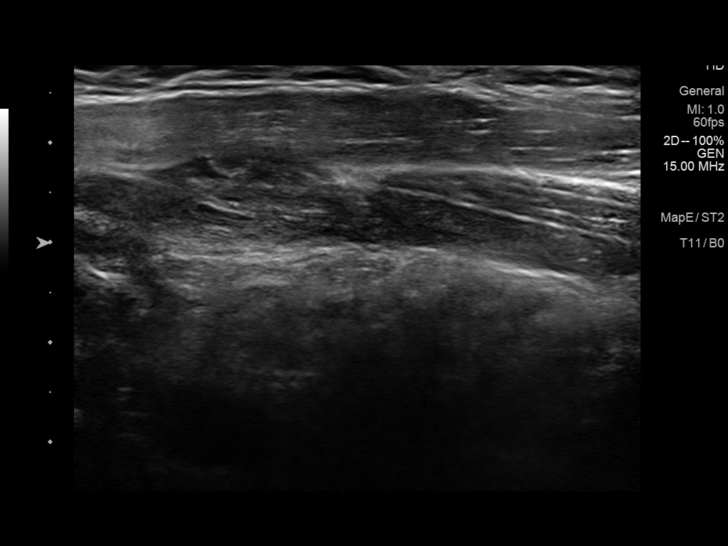
[im 9/27]
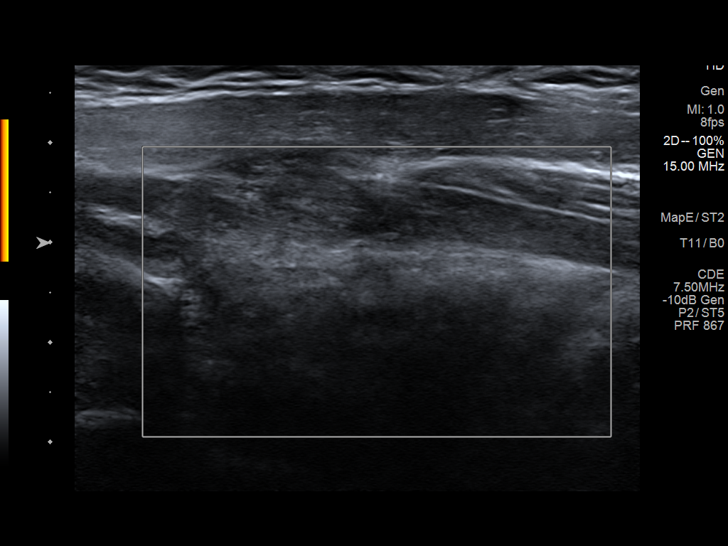
[im 10/27]
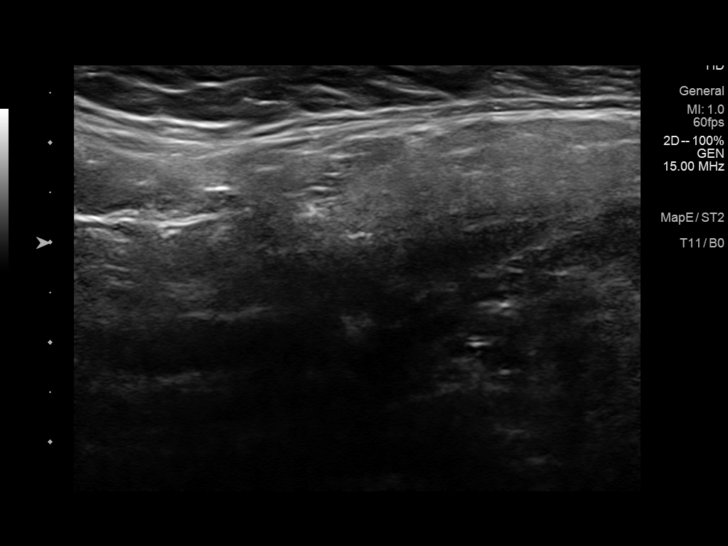
[im 12/27]
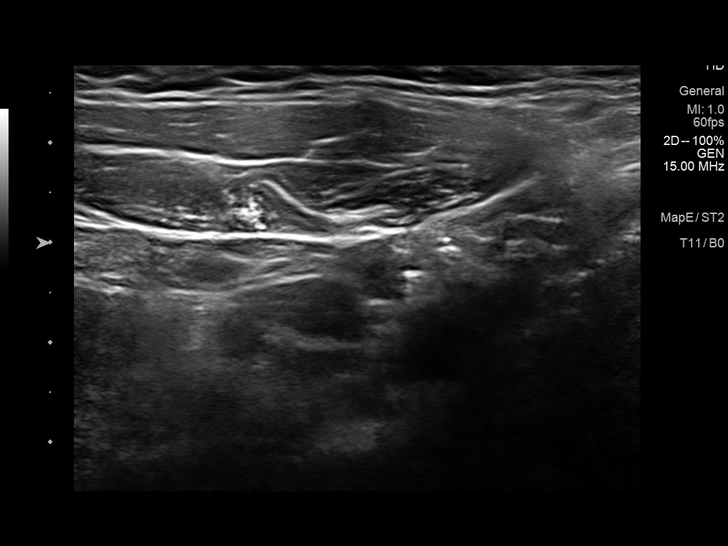
[im 15/27]
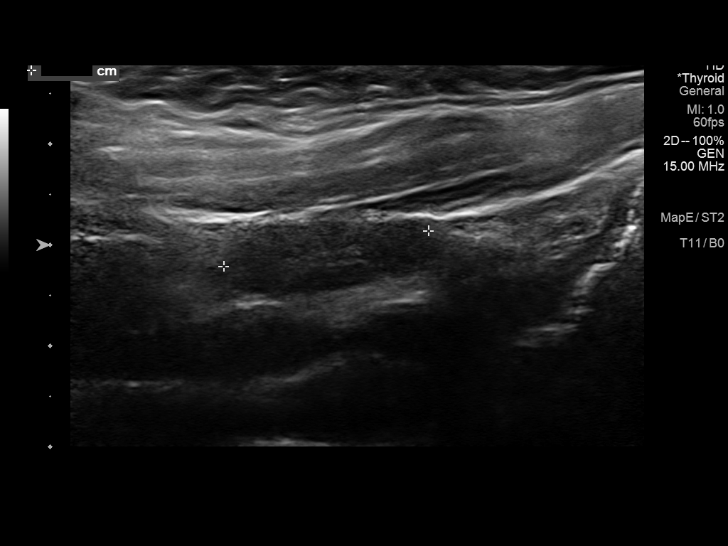
[im 17/27]
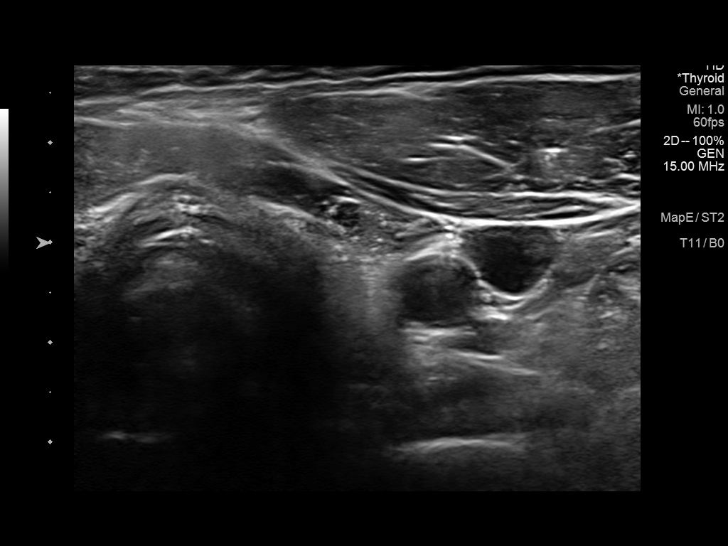
[im 18/27]
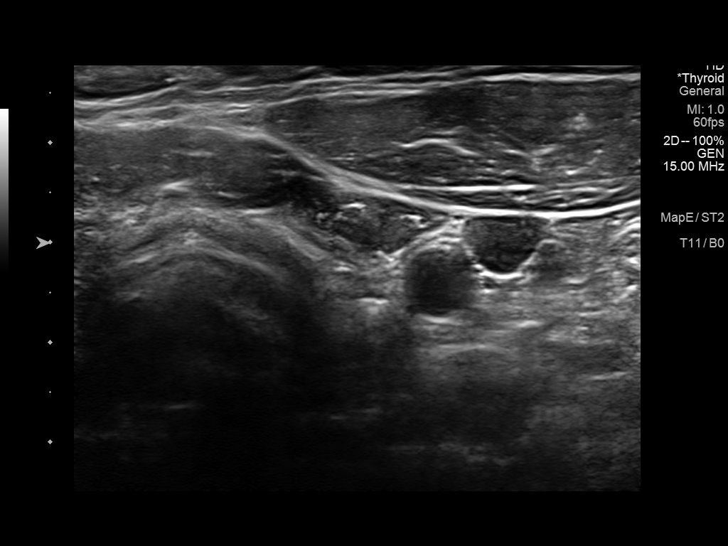
[im 20/27]
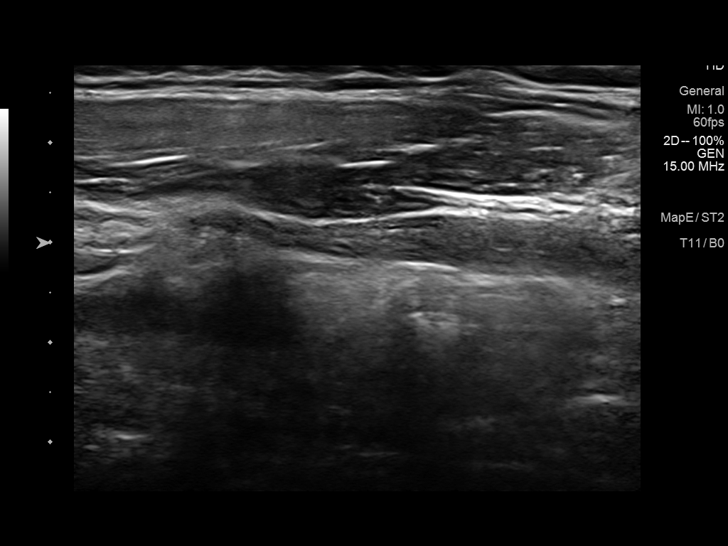
[im 22/27]
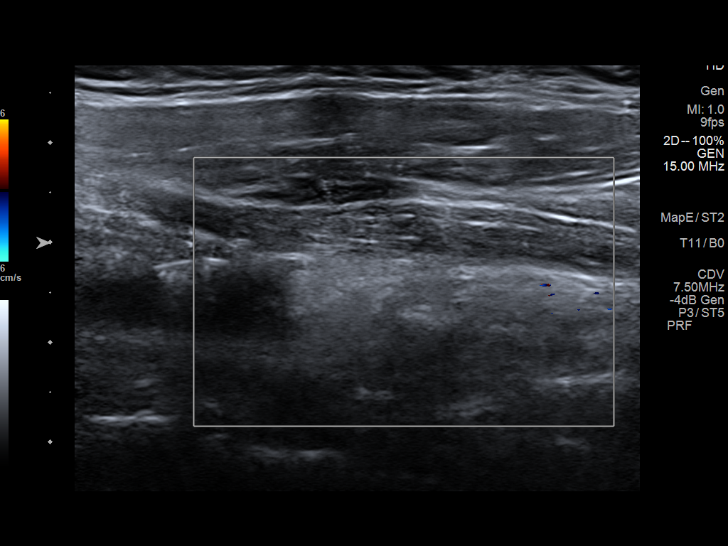
[im 24/27]
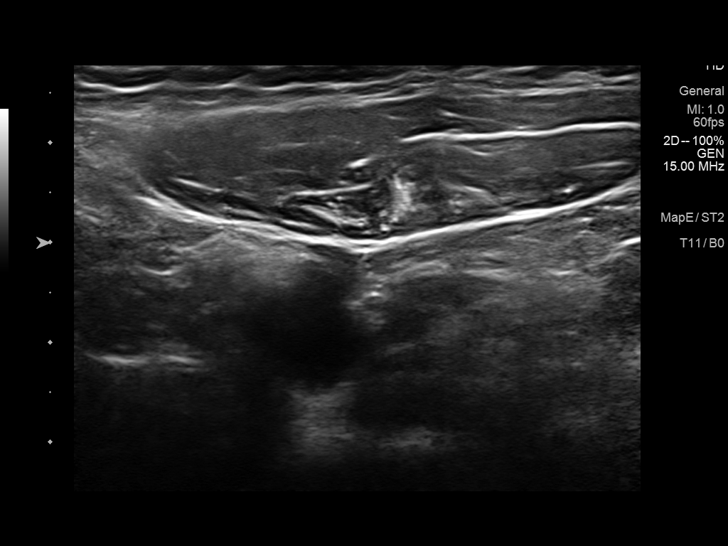
[im 27/27]
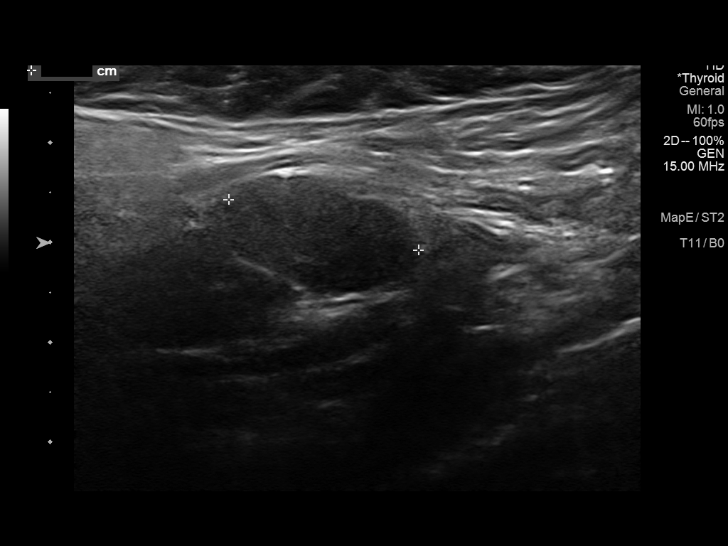

[14 of 25 positions shown; findings below may reference images not displayed]

FINDINGS: Parenchymal Echotexture: None identified

Isthmus: Surgically absent

Right lobe: Surgically absent

Left lobe: Surgically absent

_________________________________________________________

Estimated total number of nodules >/= 1 cm: 0

Number of spongiform nodules >/=  2 cm not described below (TR1): 0

Number of mixed cystic and solid nodules >/= 1.5 cm not described
below (TR2): 0

_________________________________________________________

No discrete nodules are seen within the thyroidectomy bed. Right
cervical lymph node 0.7 cm short axis diameter. Left cervical lymph
node 1 cm short axis diameter.
IMPRESSION: 1. No residual/recurrent tissue in the thyroidectomy bed.
2. Borderline enlarged left cervical lymph node.

The above is in keeping with the ACR TI-RADS recommendations - [HOSPITAL] 5033;[DATE].

## 2022-09-02 ENCOUNTER — Other Ambulatory Visit: Payer: Self-pay | Admitting: Family Medicine

## 2022-09-02 DIAGNOSIS — F331 Major depressive disorder, recurrent, moderate: Secondary | ICD-10-CM

## 2022-09-02 NOTE — Telephone Encounter (Signed)
Patient needs OV, will refill medication for 30 days. Patient needs OV for additional refills.  Requested Prescriptions  Pending Prescriptions Disp Refills   buPROPion (WELLBUTRIN XL) 300 MG 24 hr tablet [Pharmacy Med Name: BUPROPION HCL XL 300 MG TABLET] 30 tablet 0    Sig: TAKE 1 TABLET BY MOUTH EVERY DAY     Psychiatry: Antidepressants - bupropion Failed - 09/02/2022  1:51 AM      Failed - Cr in normal range and within 360 days    Creatinine  Date Value Ref Range Status  11/12/2020 1.4 (A) 0.6 - 1.3 Final   Creat  Date Value Ref Range Status  04/26/2019 1.12 0.60 - 1.35 mg/dL Final   Creatinine, Ser  Date Value Ref Range Status  09/19/2020 1.37 (H) 0.76 - 1.27 mg/dL Final         Failed - AST in normal range and within 360 days    AST  Date Value Ref Range Status  09/19/2020 17 0 - 40 IU/L Final         Failed - ALT in normal range and within 360 days    ALT  Date Value Ref Range Status  09/19/2020 13 0 - 44 IU/L Final         Failed - Valid encounter within last 6 months    Recent Outpatient Visits           5 months ago Encounter for examination required by Department of Transportation (DOT)   [facility], Coralie Keens, NP   6 months ago [place], DO   8 months ago Type 2 diabetes mellitus with other specified complication, without long-term current use of insulin [facility])   [place], DO   1 year ago Annual physical exam   [place], DO   2 years ago Bacterial conjunctivitis of right eye   [facility], Lupita Raider, [place]              Passed - Last BP in normal range    BP Readings from Last 1 Encounters:  05/14/22 125/80

## 2022-09-27 ENCOUNTER — Other Ambulatory Visit: Payer: Self-pay | Admitting: Family Medicine

## 2022-09-27 DIAGNOSIS — F331 Major depressive disorder, recurrent, moderate: Secondary | ICD-10-CM

## 2022-09-28 NOTE — Telephone Encounter (Signed)
Requested medication (s) are due for refill today: yes  Requested medication (s) are on the active medication list: yes  Last refill:  09/02/22  Future visit scheduled: no  Notes to clinic:  [place]. DX Code Needed.      Requested Prescriptions  Pending Prescriptions Disp Refills   buPROPion (WELLBUTRIN XL) 300 MG 24 hr tablet [Pharmacy Med Name: BUPROPION HCL XL 300 MG TABLET] 90 tablet 1    Sig: TAKE 1 TABLET BY MOUTH EVERY DAY     Psychiatry: Antidepressants - bupropion Failed - 09/27/2022  9:34 AM      Failed - Cr in normal range and within 360 days    Creatinine  Date Value Ref Range Status  11/12/2020 1.4 (A) 0.6 - 1.3 Final   Creat  Date Value Ref Range Status  04/26/2019 1.12 0.60 - 1.35 mg/dL Final   Creatinine, Ser  Date Value Ref Range Status  09/19/2020 1.37 (H) 0.76 - 1.27 mg/dL Final         Failed - AST in normal range and within 360 days    AST  Date Value Ref Range Status  09/19/2020 17 0 - 40 IU/L Final         Failed - ALT in normal range and within 360 days    ALT  Date Value Ref Range Status  09/19/2020 13 0 - 44 IU/L Final         Failed - Valid encounter within last 6 months    Recent Outpatient Visits           6 months ago Encounter for examination required by Department of Transportation (DOT)   [facility], Coralie Keens, NP   7 months ago [place], DO   9 months ago Type 2 diabetes mellitus with other specified complication, without long-term current use of insulin [facility])   [place], DO   2 years ago Annual physical exam   [place], DO   2 years ago Bacterial conjunctivitis of right eye   [facility], Lupita Raider, [place]              Passed - Last BP in normal range    BP Readings from Last 1 Encounters:  05/14/22 125/80

## 2022-10-11 ENCOUNTER — Other Ambulatory Visit: Payer: Self-pay | Admitting: Family Medicine

## 2022-10-11 DIAGNOSIS — E1169 Type 2 diabetes mellitus with other specified complication: Secondary | ICD-10-CM

## 2022-10-11 NOTE — Telephone Encounter (Signed)
Requested medication (s) are due for refill today: yes  Requested medication (s) are on the active medication list: yes  Last refill:  07/04/22 #30/2  Future visit scheduled: yes 10/14/22  Notes to clinic:  Unable to refill per protocol due to failed labs, no updated results.      Requested Prescriptions  Pending Prescriptions Disp Refills   JANUVIA 100 MG tablet [Pharmacy Med Name: JANUVIA 100 MG TABLET] 30 tablet 2    Sig: TAKE 1 TABLET BY [place] DAY     Endocrinology:  Diabetes - DPP-4 Inhibitors Failed - 10/11/2022  1:52 AM      Failed - HBA1C is between 0 and 7.9 and within 180 days    Hemoglobin A1C  Date Value Ref Range Status  04/08/2022 9.2 (A) 4.0 - 5.6 % Final   Hgb A1c MFr Bld  Date Value Ref Range Status  01/07/2022 10.2 (H) 4.8 - 5.6 % Final    Comment:    **Verified by repeat analysis**          Prediabetes: 5.7 - 6.4          Diabetes: >6.4          Glycemic control for adults with diabetes: <7.0          Failed - Cr in normal range and within 360 days    Creatinine  Date Value Ref Range Status  11/12/2020 1.4 (A) 0.6 - 1.3 Final   Creat  Date Value Ref Range Status  04/26/2019 1.12 0.60 - 1.35 mg/dL Final   Creatinine, Ser  Date Value Ref Range Status  09/19/2020 1.37 (H) 0.76 - 1.27 mg/dL Final         Failed - Valid encounter within last 6 months    Recent Outpatient Visits           6 months ago Encounter for examination required by Department of Transportation (DOT)   [facility], Coralie Keens, NP   7 months ago [place], DO   9 months ago Type 2 diabetes mellitus with other specified complication, without long-term current use of insulin [facility])   [place], Devonne Doughty, DO   2 years ago Annual physical exam   [place], DO   2 years ago Bacterial conjunctivitis of right eye   [facility], Lupita Raider, FNP       Future Appointments             In 3 days [place], Devonne Doughty, DO [facility]

## 2022-10-12 ENCOUNTER — Other Ambulatory Visit: Payer: Self-pay | Admitting: Urology

## 2022-10-12 DIAGNOSIS — E291 Testicular hypofunction: Secondary | ICD-10-CM

## 2022-10-14 ENCOUNTER — Encounter: Payer: Self-pay | Admitting: Family Medicine

## 2022-10-14 ENCOUNTER — Other Ambulatory Visit: Payer: Self-pay | Admitting: Family Medicine

## 2022-10-14 ENCOUNTER — Ambulatory Visit: Payer: BC Managed Care – PPO | Admitting: Family Medicine

## 2022-10-14 VITALS — BP 132/88 | HR 91 | Ht 75.0 in | Wt 293.6 lb

## 2022-10-14 DIAGNOSIS — E1169 Type 2 diabetes mellitus with other specified complication: Secondary | ICD-10-CM | POA: Diagnosis not present

## 2022-10-14 DIAGNOSIS — F331 Major depressive disorder, recurrent, moderate: Secondary | ICD-10-CM | POA: Diagnosis not present

## 2022-10-14 DIAGNOSIS — I10 Essential (primary) hypertension: Secondary | ICD-10-CM

## 2022-10-14 DIAGNOSIS — F419 Anxiety disorder, unspecified: Secondary | ICD-10-CM | POA: Diagnosis not present

## 2022-10-14 DIAGNOSIS — M1A09X0 Idiopathic chronic gout, multiple sites, without tophus (tophi): Secondary | ICD-10-CM

## 2022-10-14 DIAGNOSIS — Z125 Encounter for screening for malignant neoplasm of prostate: Secondary | ICD-10-CM

## 2022-10-14 DIAGNOSIS — Z0000 Encounter for general adult medical examination without abnormal findings: Secondary | ICD-10-CM

## 2022-10-14 LAB — POCT GLYCOSYLATED HEMOGLOBIN (HGB A1C): Hemoglobin A1C: 8.7 % — AB (ref 4.0–5.6)

## 2022-10-14 MED ORDER — FARXIGA 10 MG PO TABS: 10.0000 mg | ORAL_TABLET | Freq: Every day | ORAL | 3 refills | Status: DC

## 2022-10-14 MED ORDER — BUSPIRONE HCL 15 MG PO TABS: 15.0000 mg | ORAL_TABLET | Freq: Two times a day (BID) | ORAL | 3 refills | Status: DC

## 2022-10-14 MED ORDER — JANUVIA 100 MG PO TABS: 100.0000 mg | ORAL_TABLET | Freq: Every day | ORAL | 3 refills | Status: DC

## 2022-10-14 NOTE — Assessment & Plan Note (Signed)
A1c at 8.7, down from prior 9-10 range MEN2A thyroid/pheo Remains OFF Metformin Complications - hyperlipidemia inc risk of cardiovascular complication, obesity, gout OFF Metformin XR due to GI intolerance and concern w/ potential side effect Cannot take GLP1  Plan:  1. Continue Farxiga '10mg'$  daily, Januvia '100mg'$  daily 2. Encourage improved lifestyle - low carb, low sugar diet, reduce portion size, continue improving regular exercise 3. Check CBG, bring log to next visit for review  DIABETES Foot exam, DIABETES Eye exam request, Urine microalbumin

## 2022-10-14 NOTE — Patient Instructions (Addendum)
Thank you for coming to the office today.  Recent Labs    01/07/22 1010 04/08/22 1610 10/14/22 1446  HGBA1C 10.2* 9.2* 8.7*   Improved Keep up the good work on the lifestyle with weight management  Refilled both medications Januvia, Farxiga  Foot check was normal Urine check today for protein / kidneys Diabetic Eye Screening Exam - please have them send [place] a copy of the record - our fax [phone]  For the Anxiety  Buspar '10mg'$  was increased to '15mg'$ , dose twice per day. Probably the highest dose we would consider. Technically it can go a bit higher but may not be ideal.  Consider therapy in future if want to talk with someone or other opinion.  Continue the Sildenafil.  DUE for FASTING BLOOD WORK (no food or drink after midnight before the lab appointment, only water or coffee without cream/sugar on the morning of)  SCHEDULE "Lab Only" visit in the morning at the clinic for lab draw in 6 MONTHS   - Make sure Lab Only appointment is at about 1 week before your next appointment, so that results will be available  For Lab Results, once available within 2-3 days of blood draw, you can can log in to MyChart online to view your results and a brief explanation. Also, we can discuss results at next follow-up visit.    Please schedule a Follow-up Appointment to: Return in about 6 months (around 04/14/2023) for 6 month fasting lab, then 1 week later Annual Physical (ALSO needs DOT w Rollene Fare anytime in summer).  If you have any other questions or concerns, please feel free to call the office or send a message through [place]. You may also schedule an earlier appointment if necessary.  Additionally, you may be receiving a survey about your experience at our office within a few days to 1 week by e-mail or mail. We value your feedback.  Nobie Putnam, DO [place]

## 2022-10-14 NOTE — Progress Notes (Signed)
Subjective:    Patient ID: Aaron Schwartz, male    DOB: 05-Jun-1981, 42 y.o.   MRN: [id number]  Aaron Schwartz is a 42 y.o. male presenting on 10/14/2022 for Diabetes   HPI  CHRONIC DM, Type 2 / Morbid Obesity BMI >36 A1c previously 9-10, down to A1c 8.7 now with improvement. Due for lab CBGs: None recently Meds: Farxiga '10mg'$  daily - tolerating well, staying hydrated. Currently on ACEi Lifestyle: Weight stable - Diet (improved appetite now, improved hydration) - Exercise (increased activity w/ football coaching) Last DM Eye Exam [place], due for repeat vision exam. Denies hypoglycemia, polyuria, visual changes, numbness or tingling   Depression recurrent Anxiety  Improved on medication but not adequately controlling. Buspar '10mg'$  TWICE A DAY is not as effective. Asking about dose increase Cannot take SSRI due to side effect ED Clonazepam 0.'5mg'$  TWICE A DAY AS NEEDED No therapy but may reconsider Improved on Wellbutrin for Depression   We decided against restarting fluoxetine or escitalopram for now. Due to ED       10/14/2022   10:50 PM 03/01/2022    3:50 PM 12/28/2021   11:04 AM  Depression screen PHQ 2/9  Decreased Interest '2 2 1  '$ Down, Depressed, Hopeless '2 2 1  '$ PHQ - 2 Score '4 4 2  '$ Altered sleeping '3 3 2  '$ Tired, decreased energy '1 1 2  '$ Change in appetite 0 1 1  Feeling bad or failure about yourself  '1 1 1  '$ Trouble concentrating  2 1  Moving slowly or fidgety/restless 2 1 0  Suicidal thoughts 0 0 0  PHQ-9 Score '11 13 9  '$ Difficult doing work/chores Somewhat difficult Very difficult Somewhat difficult      10/14/2022   10:52 PM 03/01/2022    3:50 PM 12/28/2021   11:04 AM 12/06/2019    8:59 AM  GAD 7 : Generalized Anxiety Score  Nervous, Anxious, on Edge '3 3 2 1  '$ Control/stop worrying '3 2 2 1  '$ Worry too much - different things '2 2 2 1  '$ Trouble relaxing '3 3 2 1  '$ Restless '2 2 1 '$ 0  Easily annoyed or irritable '3 3 2 '$ 0  Afraid - awful might happen '2 2 3 '$ 0   Total GAD 7 Score '18 17 14 4  '$ Anxiety Difficulty Somewhat difficult Somewhat difficult  Not difficult at all      Social History   Tobacco Use   Smoking status: Never   Smokeless tobacco: Never  Vaping Use   Vaping Use: Never used  Substance Use Topics   Alcohol use: Yes    Comment: occasionally    Drug use: No    Review of Systems Per HPI unless specifically indicated above     Objective:    BP 132/88   Pulse 91   Ht '6\' 3"'$  (1.905 m)   Wt 293 lb 9.6 oz (133.2 kg)   SpO2 99%   BMI 36.70 kg/m   Wt Readings from Last 3 Encounters:  10/14/22 293 lb 9.6 oz (133.2 kg)  05/14/22 297 lb (134.7 kg)  03/29/22 300 lb (136.1 kg)    Physical Exam Vitals and nursing note reviewed.  Constitutional:      General: He is not in acute distress.    Appearance: He is well-developed. He is obese. He is not diaphoretic.     Comments: Well-appearing, comfortable, cooperative  HENT:     Head: Normocephalic and atraumatic.  Eyes:     General:  Right eye: No discharge.        Left eye: No discharge.     Conjunctiva/sclera: Conjunctivae normal.  Neck:     Thyroid: No thyromegaly.  Cardiovascular:     Rate and Rhythm: Normal rate and regular rhythm.     Pulses: Normal pulses.     Heart sounds: Normal heart sounds. No murmur heard. Pulmonary:     Effort: Pulmonary effort is normal. No respiratory distress.     Breath sounds: Normal breath sounds. No wheezing or rales.  Musculoskeletal:        General: Normal range of motion.     Cervical back: Normal range of motion and neck supple.  Lymphadenopathy:     Cervical: No cervical adenopathy.  Skin:    General: Skin is warm and dry.     Findings: No erythema or rash.  Neurological:     Mental Status: He is alert and oriented to person, place, and time. Mental status is at baseline.  Psychiatric:        Behavior: Behavior normal.     Comments: Well groomed, good eye contact, normal speech and thoughts     Diabetic Foot  Exam - Simple   Simple Foot Form Diabetic Foot exam was performed with the following findings: Yes 10/14/2022  3:04 PM  Visual Inspection No deformities, no ulcerations, no other skin breakdown bilaterally: Yes Sensation Testing Intact to touch and monofilament testing bilaterally: Yes Pulse Check Posterior Tibialis and Dorsalis pulse intact bilaterally: Yes Comments      Results for orders placed or performed in visit on 10/14/22  POCT glycosylated hemoglobin (Hb A1C)  Result Value Ref Range   Hemoglobin A1C 8.7 (A) 4.0 - 5.6 %      Assessment & Plan:   Problem List Items Addressed This Visit     Anxiety   Relevant Medications   busPIRone (BUSPAR) 15 MG tablet   Type 2 diabetes mellitus with other specified complication (HCC) - Primary    A1c at 8.7, down from prior 9-10 range MEN2A thyroid/pheo Remains OFF Metformin Complications - hyperlipidemia inc risk of cardiovascular complication, obesity, gout OFF Metformin XR due to GI intolerance and concern w/ potential side effect Cannot take GLP1  Plan:  1. Continue Farxiga '10mg'$  daily, Januvia '100mg'$  daily 2. Encourage improved lifestyle - low carb, low sugar diet, reduce portion size, continue improving regular exercise 3. Check CBG, bring log to next visit for review  DIABETES Foot exam, DIABETES Eye exam request, Urine microalbumin      Relevant Medications   JANUVIA 100 MG tablet   FARXIGA 10 MG TABS tablet   Other Relevant Orders   POCT glycosylated hemoglobin (Hb A1C) (Completed)   Urine Microalbumin w/creat. ratio   Other Visit Diagnoses     Moderate episode of recurrent major depressive disorder ([place])       Relevant Medications   busPIRone (BUSPAR) 15 MG tablet        Refilled both medications Januvia, Farxiga  Foot check was normal Urine check today for protein / kidneys Diabetic Eye Screening Exam - please have them send [place] a copy of the record - our fax [phone]  For the Anxiety  Buspar  '10mg'$  was increased to '15mg'$ , dose twice per day. Probably the highest dose we would consider. Technically it can go a bit higher but may not be ideal.  Consider therapy in future if want to talk with someone or other opinion.  Continue the Sildenafil.   Orders  Placed This Encounter  Procedures   Urine Microalbumin w/creat. ratio   POCT glycosylated hemoglobin (Hb A1C)     Meds ordered this encounter  Medications   JANUVIA 100 MG tablet    Sig: Take 1 tablet (100 mg total) by mouth daily.    Dispense:  90 tablet    Refill:  3    Please switch the 30 day order recently to 90 day.   FARXIGA 10 MG TABS tablet    Sig: Take 1 tablet (10 mg total) by mouth daily before breakfast.    Dispense:  90 tablet    Refill:  3   busPIRone (BUSPAR) 15 MG tablet    Sig: Take 1 tablet (15 mg total) by mouth 2 (two) times daily.    Dispense:  180 tablet    Refill:  3    Increase dose from 10 to '15mg'$       Follow up plan: Return in about 6 months (around 04/14/2023) for 6 month fasting lab, then 1 week later Annual Physical (ALSO needs DOT w Rollene Fare anytime in summer).  Future labs ordered for 03/2023   Nobie Putnam, [facility] 10/14/2022, 2:45 PM

## 2022-10-15 LAB — MICROALBUMIN / CREATININE URINE RATIO
Creatinine, Urine: 78 mg/dL (ref 20–320)
Microalb Creat Ratio: 218 mcg/mg creat — ABNORMAL HIGH (ref <30)
Microalb, Ur: 17 mg/dL

## 2022-10-28 ENCOUNTER — Encounter: Payer: Self-pay | Admitting: Family Medicine

## 2022-10-28 DIAGNOSIS — H109 Unspecified conjunctivitis: Secondary | ICD-10-CM

## 2022-10-29 MED ORDER — OFLOXACIN 0.3 % OP SOLN: 1.0000 [drp] | Freq: Four times a day (QID) | OPHTHALMIC | 0 refills | Status: DC

## 2022-11-19 ENCOUNTER — Other Ambulatory Visit: Payer: Self-pay | Admitting: Urology

## 2022-11-19 DIAGNOSIS — E291 Testicular hypofunction: Secondary | ICD-10-CM

## 2022-11-19 DIAGNOSIS — E349 Endocrine disorder, unspecified: Secondary | ICD-10-CM

## 2022-11-20 ENCOUNTER — Telehealth: Payer: Self-pay | Admitting: Urology

## 2022-11-26 NOTE — Progress Notes (Unsigned)
11/27/2022 9:27 PM   Aaron Schwartz August 04, 1981 [id number]  Referring provider: Olin Hauser, DO [address] [place],  [place] [address]  Urological history: 1. Testosterone deficiency -Contributing factors of age, obesity and diabetes -testosterone level pending -Hemoglobin/hematocrit pending -testosterone cypionate 200 mg/mL, 0.5 cc every 7 days  2. Pheochromocytoma -right adrenalectomy, 2020 -followed by endocrinology - last visit 01/2022  3. BPH with LU TS -PSA pending -I PSS ***  4. ED -contributing factors of age, BPH, diabetes, HTN, HLD, depression, anxiety, testosterone deficiency, COPDand alcohol consumption -SHIM *** -sildenafil 20 mg, on-demand-dosing  No chief complaint on file.   HPI: Aaron Schwartz is a 42 y.o. male who presents today follow up.         Score:  1-7 Mild 8-19 Moderate 20-35 Severe        Score: 1-7 Severe ED 8-11 Moderate ED 12-16 Mild-Moderate ED 17-21 Mild ED 22-25 No ED       PMH: Past Medical History:  Diagnosis Date   Allergy    Asthma    Depression    Hypertension     Surgical History: Past Surgical History:  Procedure Laterality Date   none      Home Medications:  Allergies as of 11/27/2022       Reactions   Gramineae Pollens    Mixed Grasses         Medication List        Accurate as of November 26, 2022  9:27 PM. If you have any questions, ask your nurse or doctor.          2-3CC SYRINGE 3 ML Misc 1 mg by Does not apply route every 14 (fourteen) days.   albuterol 108 (90 Base) MCG/ACT inhaler Commonly known as: VENTOLIN HFA TAKE 2 PUFFS BY MOUTH EVERY 6 HOURS AS NEEDED FOR WHEEZE OR SHORTNESS OF BREATH   allopurinol 100 MG tablet Commonly known as: ZYLOPRIM Take 2 tablets (200 mg total) by mouth daily.   atorvastatin 10 MG tablet Commonly known as: LIPITOR Take 1 tablet (10 mg total) by mouth at bedtime.   BD Disp Needles 18G X 1-1/2" Misc Generic drug: NEEDLE  (DISP) 18 G 1 mg by Does not apply route every 14 (fourteen) days.   BD Disp Needles 21G X 1-1/2" Misc Generic drug: NEEDLE (DISP) 21 G 1 mg by Does not apply route every 14 (fourteen) days.   buPROPion 300 MG 24 hr tablet Commonly known as: WELLBUTRIN XL TAKE 1 TABLET BY MOUTH EVERY DAY   busPIRone 15 MG tablet Commonly known as: BUSPAR Take 1 tablet (15 mg total) by mouth 2 (two) times daily.   clonazePAM 0.5 MG tablet Commonly known as: KLONOPIN Take 1 tablet (0.5 mg total) by mouth 2 (two) times daily as needed for anxiety.   Farxiga 10 MG Tabs tablet Generic drug: dapagliflozin propanediol Take 1 tablet (10 mg total) by mouth daily before breakfast.   fluticasone 50 MCG/ACT nasal spray Commonly known as: FLONASE SPRAY 2 SPRAYS INTO EACH NOSTRIL EVERY DAY   ipratropium 0.03 % nasal spray Commonly known as: ATROVENT Place 2 sprays into both nostrils every 12 (twelve) hours.   Januvia 100 MG tablet Generic drug: sitaGLIPtin Take 1 tablet (100 mg total) by mouth daily.   levothyroxine 137 MCG tablet Commonly known as: SYNTHROID Take 274 mcg by mouth daily before breakfast. Per Endocrinology   lisinopril 20 MG tablet Commonly known as: ZESTRIL TAKE 1 TABLET BY MOUTH EVERY DAY  ofloxacin 0.3 % ophthalmic solution Commonly known as: OCUFLOX Place 1 drop into both eyes 4 (four) times daily. For 7 days   sildenafil 20 MG tablet Commonly known as: REVATIO Take 1-5 pills about 30 min prior to sex. Start with 1 and increase as needed.   Taltz 80 MG/ML Soaj Generic drug: Ixekizumab Inject into the skin.   testosterone cypionate 200 MG/ML injection Commonly known as: DEPOTESTOSTERONE CYPIONATE Inject 0.5 cc every 7 days   triamcinolone 0.025 % cream Commonly known as: KENALOG Apply topically daily as needed.   Vitamin D (Ergocalciferol) 1.25 MG (50000 UNIT) Caps capsule Commonly known as: DRISDOL Take 50,000 Units by mouth once a week.        Allergies:   Allergies  Allergen Reactions   Gramineae Pollens    Mixed Grasses     Family History: Family History  Problem Relation Age of Onset   Hypertension Mother    Thyroid cancer Mother     Social History:  reports that he has never smoked. He has never used smokeless tobacco. He reports current alcohol use. He reports that he does not use drugs.  ROS: Pertinent ROS in HPI  Physical Exam: There were no vitals taken for this visit.  Constitutional:  Well nourished. Alert and oriented, No acute distress. HEENT: [place] AT, moist mucus membranes.  Trachea midline Cardiovascular: No clubbing, cyanosis, or edema. Respiratory: Normal respiratory effort, no increased work of breathing. GU: No CVA tenderness.  No bladder fullness or masses.  Patient with circumcised/uncircumcised phallus. ***Foreskin easily retracted***  Urethral meatus is patent.  No penile discharge. No penile lesions or rashes. Scrotum without lesions, cysts, rashes and/or edema.  Testicles are located scrotally bilaterally. No masses are appreciated in the testicles. Left and right epididymis are normal. Rectal: Patient with  normal sphincter tone. Anus and perineum without scarring or rashes. No rectal masses are appreciated. Prostate is approximately *** grams, *** nodules are appreciated. Seminal vesicles are normal. Neurologic: Grossly intact, no focal deficits, moving all 4 extremities. Psychiatric: Normal mood and affect.   Laboratory Data: Serum creatinine (07/2022) 1.3 Lab Results  Component Value Date   HGBA1C 8.7 (A) 10/14/2022  I have reviewed the labs.   Pertinent Imaging N/A  Assessment & Plan:    1. Testosterone deficiency  -We will change from AndroGel to testosterone cypionate -he will start testosterone cypionate 200 mg, 0.5 cc every 7 days -RTC after midway after fourth injection to check testosterone, hemoglobin hematocrit levels  2. BPH with LUTS -PSA stable -continue conservative management,  avoiding bladder irritants and timed voiding's   3. Erectile dysfunction:    -We will reassess once therapeutic levels are achieved with TRT as he is having suboptimal response to PDE 5 inhibitors  No follow-ups on file.  These notes generated with voice recognition software. I apologize for typographical errors.  [place], [place] [address]  [place], [place] [address] [phone]

## 2022-11-27 ENCOUNTER — Encounter: Payer: Self-pay | Admitting: Urology

## 2022-11-27 ENCOUNTER — Ambulatory Visit: Payer: BC Managed Care – PPO | Admitting: Urology

## 2022-11-27 VITALS — BP 138/85 | HR 80 | Ht 75.0 in | Wt 293.0 lb

## 2022-11-27 DIAGNOSIS — E291 Testicular hypofunction: Secondary | ICD-10-CM

## 2022-11-27 DIAGNOSIS — N401 Enlarged prostate with lower urinary tract symptoms: Secondary | ICD-10-CM | POA: Diagnosis not present

## 2022-11-27 DIAGNOSIS — N529 Male erectile dysfunction, unspecified: Secondary | ICD-10-CM

## 2022-11-27 DIAGNOSIS — E349 Endocrine disorder, unspecified: Secondary | ICD-10-CM

## 2022-11-27 DIAGNOSIS — N521 Erectile dysfunction due to diseases classified elsewhere: Secondary | ICD-10-CM

## 2022-11-27 DIAGNOSIS — N138 Other obstructive and reflux uropathy: Secondary | ICD-10-CM

## 2022-11-27 MED ORDER — SYRINGE 2-3 ML 3 ML MISC: 1.0000 mg | 3 refills | Status: AC

## 2022-11-27 MED ORDER — "BD DISP NEEDLES 18G X 1-1/2"" MISC": 1.0000 mg | 0 refills | Status: AC

## 2022-11-27 MED ORDER — SILDENAFIL CITRATE 20 MG PO TABS: ORAL_TABLET | ORAL | 3 refills | Status: AC

## 2022-11-27 MED ORDER — "BD DISP NEEDLES 21G X 1-1/2"" MISC": 1.0000 mg | 0 refills | Status: AC

## 2022-11-27 MED ORDER — TESTOSTERONE CYPIONATE 200 MG/ML IM SOLN: INTRAMUSCULAR | 0 refills | Status: DC

## 2022-11-27 NOTE — Patient Instructions (Signed)
We will call you about your lab results.

## 2022-11-28 ENCOUNTER — Other Ambulatory Visit: Payer: Self-pay | Admitting: Family Medicine

## 2022-11-28 DIAGNOSIS — E291 Testicular hypofunction: Secondary | ICD-10-CM

## 2022-11-28 LAB — PSA: Prostate Specific Ag, Serum: 0.6 ng/mL (ref 0.0–4.0)

## 2022-11-28 LAB — HEMATOCRIT: Hematocrit: 54.8 % — ABNORMAL HIGH (ref 37.5–51.0)

## 2022-11-28 LAB — TESTOSTERONE: Testosterone: 334 ng/dL (ref 264–916)

## 2022-11-28 LAB — HEMOGLOBIN: Hemoglobin: 18.1 g/dL — ABNORMAL HIGH (ref 13.0–17.7)

## 2022-12-09 ENCOUNTER — Telehealth: Payer: Self-pay | Admitting: Family Medicine

## 2022-12-09 NOTE — Telephone Encounter (Signed)
-----   Message from Nori Riis, PA-C sent at 12/08/2022  8:25 PM EDT ----- Please call Mr. Stanczyk and get him scheduled for a lab visit in two months for testosterone, H & H.

## 2022-12-09 NOTE — Telephone Encounter (Signed)
Spoke to patient to try to get him a lab appointment. He states he will have to call back.

## 2022-12-10 ENCOUNTER — Other Ambulatory Visit: Payer: Self-pay | Admitting: Family Medicine

## 2022-12-10 DIAGNOSIS — I10 Essential (primary) hypertension: Secondary | ICD-10-CM

## 2022-12-10 NOTE — Telephone Encounter (Signed)
Requested medication (s) are due for refill today:   Yes  Requested medication (s) are on the active medication list:   Yes  Future visit scheduled:   Yes 04/14/2023   Last ordered: 12/12/2021 #90, 3 refills  Returned because potassium is due per protocol.     Requested Prescriptions  Pending Prescriptions Disp Refills   lisinopril (ZESTRIL) 20 MG tablet [Pharmacy Med Name: LISINOPRIL 20 MG TABLET] 90 tablet 3    Sig: TAKE 1 TABLET BY MOUTH EVERY DAY     Cardiovascular:  ACE Inhibitors Failed - 12/10/2022  1:57 AM      Failed - Cr in normal range and within 180 days    Creatinine  Date Value Ref Range Status  11/12/2020 1.4 (A) 0.6 - 1.3 Final   Creat  Date Value Ref Range Status  04/26/2019 1.12 0.60 - 1.35 mg/dL Final   Creatinine, Ser  Date Value Ref Range Status  09/19/2020 1.37 (H) 0.76 - 1.27 mg/dL Final   Creatinine, Urine  Date Value Ref Range Status  10/14/2022 78 20 - 320 mg/dL Final         Failed - K in normal range and within 180 days    Potassium  Date Value Ref Range Status  11/12/2020 5.4 (A) 3.4 - 5.3 Final         Passed - Patient is not pregnant      Passed - Last BP in normal range    BP Readings from Last 1 Encounters:  11/27/22 138/85         Passed - Valid encounter within last 6 months    Recent Outpatient Visits           1 month ago Type 2 diabetes mellitus with other specified complication, without long-term current use of insulin [facility])   [place], DO   8 months ago Encounter for examination required by Department of Transportation (DOT)   [facility], Coralie Keens, NP   9 months ago [place], DO   11 months ago Type 2 diabetes mellitus with other specified complication, without long-term current use of insulin [facility])   [facility] Olin Hauser,  DO   2 years ago Annual physical exam   [place], DO       Future Appointments             In 4 months [place], Devonne Doughty, DO [facility], [facility]

## 2022-12-11 ENCOUNTER — Other Ambulatory Visit: Payer: Self-pay | Admitting: Family Medicine

## 2022-12-11 DIAGNOSIS — J209 Acute bronchitis, unspecified: Secondary | ICD-10-CM

## 2022-12-11 DIAGNOSIS — J302 Other seasonal allergic rhinitis: Secondary | ICD-10-CM

## 2022-12-11 NOTE — Telephone Encounter (Signed)
Requested Prescriptions  Pending Prescriptions Disp Refills   fluticasone (FLONASE) 50 MCG/ACT nasal spray [Pharmacy Med Name: FLUTICASONE PROP 50 MCG SPRAY] 48 mL 1    Sig: SPRAY 2 SPRAYS INTO EACH NOSTRIL EVERY DAY     Ear, Nose, and Throat: Nasal Preparations - Corticosteroids Passed - 12/11/2022  1:51 AM      Passed - Valid encounter within last 12 months    Recent Outpatient Visits           1 month ago Type 2 diabetes mellitus with other specified complication, without long-term current use of insulin [facility])   [place], DO   8 months ago Encounter for examination required by Department of Transportation (DOT)   [facility] [place], Coralie Keens, NP   9 months ago [place], DO   11 months ago Type 2 diabetes mellitus with other specified complication, without long-term current use of insulin [facility])   [facility] [place], Devonne Doughty, DO   2 years ago Annual physical exam   [place], DO       Future Appointments             In 4 months [place], Devonne Doughty, DO [facility], [facility]

## 2023-01-09 ENCOUNTER — Other Ambulatory Visit: Payer: Self-pay | Admitting: Family Medicine

## 2023-01-09 DIAGNOSIS — E1169 Type 2 diabetes mellitus with other specified complication: Secondary | ICD-10-CM

## 2023-01-09 NOTE — Telephone Encounter (Signed)
Requested medications are due for refill today.  yes  Requested medications are on the active medications list.  yes  Last refill. 01/09/2022 #90 3 rf  Future visit scheduled.   yes  Notes to clinic.  Labs are expired.    Requested Prescriptions  Pending Prescriptions Disp Refills   atorvastatin (LIPITOR) 10 MG tablet [Pharmacy Med Name: ATORVASTATIN 10 MG TABLET] 90 tablet 3    Sig: TAKE 1 TABLET BY MOUTH EVERYDAY AT BEDTIME     Cardiovascular:  Antilipid - Statins Failed - 01/09/2023  1:47 AM      Failed - Lipid Panel in normal range within the last 12 months    Cholesterol, Total  Date Value Ref Range Status  01/07/2022 278 (H) 100 - 199 mg/dL Final   LDL Chol Calc (NIH)  Date Value Ref Range Status  01/07/2022 201 (H) 0 - 99 mg/dL Final   HDL  Date Value Ref Range Status  01/07/2022 49 >39 mg/dL Final   Triglycerides  Date Value Ref Range Status  01/07/2022 150 (H) 0 - 149 mg/dL Final         Passed - Patient is not pregnant      Passed - Valid encounter within last 12 months    Recent Outpatient Visits           2 months ago Type 2 diabetes mellitus with other specified complication, without long-term current use of insulin [facility])   [facility] [place], Netta Neat, DO   9 months ago Encounter for examination required by Department of Transportation (DOT)   [place] [facility] [place], Salvadore [place], NP   10 months ago Anxiety   [place] [facility] Smitty Cords, DO   1 year ago Type 2 diabetes mellitus with other specified complication, without long-term current use of insulin [facility])   [place] [facility] Smitty Cords, DO   2 years ago Annual physical exam   [place] [facility] Smitty Cords, DO       Future Appointments             In 3 months Althea Charon, Netta Neat, DO [facility], [facility]

## 2023-01-16 ENCOUNTER — Encounter: Payer: Self-pay | Admitting: Family Medicine

## 2023-01-16 ENCOUNTER — Other Ambulatory Visit: Payer: Self-pay | Admitting: Family Medicine

## 2023-01-16 DIAGNOSIS — E291 Testicular hypofunction: Secondary | ICD-10-CM

## 2023-01-16 DIAGNOSIS — F419 Anxiety disorder, unspecified: Secondary | ICD-10-CM

## 2023-01-16 MED ORDER — CLONAZEPAM 0.5 MG PO TABS
0.5000 mg | ORAL_TABLET | Freq: Two times a day (BID) | ORAL | 2 refills | Status: DC | PRN
Start: 2023-01-16 — End: 2023-04-29

## 2023-01-17 ENCOUNTER — Other Ambulatory Visit
Admission: RE | Admit: 2023-01-17 | Discharge: 2023-01-17 | Disposition: A | Discharge status: Home / Self Care | Payer: BC Managed Care – PPO | Attending: Urology | Admitting: Urology

## 2023-01-17 ENCOUNTER — Other Ambulatory Visit
Admission: RE | Admit: 2023-01-17 | Discharge: 2023-01-17 | Disposition: A | Discharge status: Home / Self Care | Payer: BC Managed Care – PPO | Attending: Dermatology | Admitting: Dermatology

## 2023-01-17 DIAGNOSIS — E291 Testicular hypofunction: Secondary | ICD-10-CM

## 2023-01-17 DIAGNOSIS — Z79899 Other long term (current) drug therapy: Secondary | ICD-10-CM | POA: Diagnosis present

## 2023-01-17 LAB — CBC WITH DIFFERENTIAL/PLATELET
Abs Immature Granulocytes: 0.01 10*3/uL (ref 0.00–0.07)
Basophils Absolute: 0 10*3/uL (ref 0.0–0.1)
Basophils Relative: 0 %
Eosinophils Absolute: 0.2 10*3/uL (ref 0.0–0.5)
Eosinophils Relative: 3 %
HCT: 48.6 % (ref 39.0–52.0)
Hemoglobin: 16.1 g/dL (ref 13.0–17.0)
Immature Granulocytes: 0 %
Lymphocytes Relative: 44 %
Lymphs Abs: 2.3 10*3/uL (ref 0.7–4.0)
MCH: 28.1 pg (ref 26.0–34.0)
MCHC: 33.1 g/dL (ref 30.0–36.0)
MCV: 85 fL (ref 80.0–100.0)
Monocytes Absolute: 0.5 10*3/uL (ref 0.1–1.0)
Monocytes Relative: 10 %
Neutro Abs: 2.2 10*3/uL (ref 1.7–7.7)
Neutrophils Relative %: 43 %
Platelets: 271 10*3/uL (ref 150–400)
RBC: 5.72 MIL/uL (ref 4.22–5.81)
RDW: 14.7 % (ref 11.5–15.5)
WBC: 5.2 10*3/uL (ref 4.0–10.5)
nRBC: 0 % (ref 0.0–0.2)

## 2023-01-17 LAB — COMPREHENSIVE METABOLIC PANEL
ALT: 22 U/L (ref 0–44)
AST: 21 U/L (ref 15–41)
Albumin: 4.4 g/dL (ref 3.5–5.0)
Alkaline Phosphatase: 68 U/L (ref 38–126)
Anion gap: 6 (ref 5–15)
BUN: 22 mg/dL — ABNORMAL HIGH (ref 6–20)
CO2: 29 mmol/L (ref 22–32)
Calcium: 8.6 mg/dL — ABNORMAL LOW (ref 8.9–10.3)
Chloride: 98 mmol/L (ref 98–111)
Creatinine, Ser: 1.44 mg/dL — ABNORMAL HIGH (ref 0.61–1.24)
GFR, Estimated: >60 mL/min (ref >60)
Glucose, Bld: 147 mg/dL — ABNORMAL HIGH (ref 70–99)
Potassium: 4 mmol/L (ref 3.5–5.1)
Sodium: 133 mmol/L — ABNORMAL LOW (ref 135–145)
Total Bilirubin: 0.7 mg/dL (ref 0.3–1.2)
Total Protein: 8.1 g/dL (ref 6.5–8.1)

## 2023-01-17 LAB — HEMOGLOBIN AND HEMATOCRIT, BLOOD
HCT: 48.9 % (ref 39.0–52.0)
Hemoglobin: 16.2 g/dL (ref 13.0–17.0)

## 2023-01-17 LAB — VITAMIN D 25 HYDROXY (VIT D DEFICIENCY, FRACTURES): Vit D, 25-Hydroxy: 40.75 ng/mL (ref 30–100)

## 2023-01-18 LAB — HEPATITIS PANEL, ACUTE
HCV Ab: NONREACTIVE
Hep A IgM: NONREACTIVE
Hep B C IgM: NONREACTIVE
Hepatitis B Surface Ag: NONREACTIVE

## 2023-01-18 LAB — TESTOSTERONE: Testosterone: 223 ng/dL — ABNORMAL LOW (ref 264–916)

## 2023-01-20 ENCOUNTER — Encounter: Payer: Self-pay | Admitting: *Deleted

## 2023-01-22 ENCOUNTER — Other Ambulatory Visit: Payer: Self-pay | Admitting: *Deleted

## 2023-01-22 DIAGNOSIS — E349 Endocrine disorder, unspecified: Secondary | ICD-10-CM

## 2023-01-23 MED ORDER — TESTOSTERONE CYPIONATE 200 MG/ML IM SOLN
INTRAMUSCULAR | 0 refills | Status: DC
Start: 2023-01-23 — End: 2023-04-23

## 2023-02-07 ENCOUNTER — Ambulatory Visit: Payer: BC Managed Care – PPO | Admitting: Urology

## 2023-02-27 ENCOUNTER — Other Ambulatory Visit: Payer: Self-pay | Admitting: Family Medicine

## 2023-02-27 DIAGNOSIS — M1A09X0 Idiopathic chronic gout, multiple sites, without tophus (tophi): Secondary | ICD-10-CM

## 2023-02-27 NOTE — Telephone Encounter (Signed)
Requested medication (s) are due for refill today: yes  Requested medication (s) are on the active medication list: yes  Last refill:  12/28/21 #180/3  Future visit scheduled: yes  Notes to clinic:  Unable to refill per protocol due to failed labs, no updated results.      Requested Prescriptions  Pending Prescriptions Disp Refills   allopurinol (ZYLOPRIM) 100 MG tablet [Pharmacy Med Name: ALLOPURINOL 100 MG TABLET] 180 tablet 3    Sig: TAKE 2 TABLETS BY MOUTH EVERY DAY     Endocrinology:  Gout Agents - allopurinol Failed - 02/27/2023  2:47 AM      Failed - Uric Acid in normal range and within 360 days    Uric Acid  Date Value Ref Range Status  01/07/2022 6.0 3.8 - 8.4 mg/dL Final    Comment:               Therapeutic target for gout patients: <6.0         Failed - Cr in normal range and within 360 days    Creat  Date Value Ref Range Status  04/26/2019 1.12 0.60 - 1.35 mg/dL Final   Creatinine, Ser  Date Value Ref Range Status  01/17/2023 1.44 (H) 0.61 - 1.24 mg/dL Final   Creatinine, Urine  Date Value Ref Range Status  10/14/2022 78 20 - 320 mg/dL Final         Passed - Valid encounter within last 12 months    Recent Outpatient Visits           4 months ago Type 2 diabetes mellitus with other specified complication, without long-term current use of insulin (HCC)   [facility] Smitty Cords, DO   11 months ago Encounter for examination required by Department of Transportation (DOT)   [facility] [place], Salvadore [place], NP   12 months ago Anxiety   [place] [facility] Smitty Cords, DO   1 year ago Type 2 diabetes mellitus with other specified complication, without long-term current use of insulin [facility])   [place] [facility] Althea Charon, Netta Neat, DO   2 years ago Annual physical exam   [place] [facility] Althea Charon, Netta Neat, DO        Future Appointments             In 1 month Althea Charon, Netta Neat, DO [place] [facility], [facility]            Passed - CBC within normal limits and completed in the last 12 months    WBC  Date Value Ref Range Status  01/17/2023 5.2 4.0 - 10.5 K/uL Final   RBC  Date Value Ref Range Status  01/17/2023 5.72 4.22 - 5.81 MIL/uL Final   Hemoglobin  Date Value Ref Range Status  01/17/2023 16.2 13.0 - 17.0 g/dL Final  78/29/5621 30.8 13.0 - 17.0 g/dL Final  65/78/4696 29.5 (H) 13.0 - 17.7 g/dL Final   HCT  Date Value Ref Range Status  01/17/2023 48.9 39.0 - 52.0 % Final    Comment:    Performed at [facility], [address], [place], [place] [address]  01/17/2023 48.6 39.0 - 52.0 % Final   Hematocrit  Date Value Ref Range Status  11/27/2022 54.8 (H) 37.5 - 51.0 % Final   MCHC  Date Value Ref Range Status  01/17/2023 33.1 30.0 - 36.0  g/dL Final   [facility]  Date Value Ref Range Status  01/17/2023 28.1 26.0 - 34.0 pg Final   MCV  Date Value Ref Range Status  01/17/2023 85.0 80.0 - 100.0 fL Final  09/19/2020 89 79 - 97 fL Final   No results found for: "PLTCOUNTKUC", "LABPLAT", "POCPLA" RDW  Date Value Ref Range Status  01/17/2023 14.7 11.5 - 15.5 % Final  09/19/2020 13.3 11.6 - 15.4 % Final

## 2023-03-10 LAB — HM DIABETES EYE EXAM

## 2023-03-25 ENCOUNTER — Ambulatory Visit (INDEPENDENT_AMBULATORY_CARE_PROVIDER_SITE_OTHER): Payer: BC Managed Care – PPO | Admitting: Internal Medicine

## 2023-03-25 ENCOUNTER — Encounter: Payer: Self-pay | Admitting: Internal Medicine

## 2023-03-25 ENCOUNTER — Ambulatory Visit (INDEPENDENT_AMBULATORY_CARE_PROVIDER_SITE_OTHER): Payer: BC Managed Care – PPO

## 2023-03-25 VITALS — BP 124/76 | HR 97 | Temp 96.7°F | Ht 75.0 in | Wt 293.0 lb

## 2023-03-25 DIAGNOSIS — E1169 Type 2 diabetes mellitus with other specified complication: Secondary | ICD-10-CM

## 2023-03-25 DIAGNOSIS — Z024 Encounter for examination for driving license: Secondary | ICD-10-CM

## 2023-03-25 LAB — POCT GLYCOSYLATED HEMOGLOBIN (HGB A1C): Hemoglobin A1C: 8.5 % — AB (ref 4.0–5.6)

## 2023-03-25 NOTE — Progress Notes (Signed)
Commercial Driver Medical Examination   Jesse Nosbisch is a 42 y.o. male who presents today for a commercial driver fitness determination physical exam. The patient reports no problems.  He has a history of HTN, HLD and DM2.  His last A1c was. The following portions of the patient's history were reviewed and updated as appropriate: allergies, current medications, past family history, past medical history, past social history, past surgical history, and problem list.  Review of Systems   Past Medical History:  Diagnosis Date   Allergy    Asthma    Depression    Hypertension     Current Outpatient Medications  Medication Sig Dispense Refill   albuterol (VENTOLIN HFA) 108 (90 Base) MCG/ACT inhaler TAKE 2 PUFFS BY MOUTH EVERY 6 HOURS AS NEEDED FOR WHEEZE OR SHORTNESS OF BREATH 8.5 each 4   allopurinol (ZYLOPRIM) 100 MG tablet TAKE 2 TABLETS BY MOUTH EVERY DAY 180 tablet 3   atorvastatin (LIPITOR) 10 MG tablet TAKE 1 TABLET BY MOUTH EVERYDAY AT BEDTIME 90 tablet 3   buPROPion (WELLBUTRIN XL) 300 MG 24 hr tablet TAKE 1 TABLET BY MOUTH EVERY DAY 90 tablet 1   busPIRone (BUSPAR) 15 MG tablet Take 1 tablet (15 mg total) by mouth 2 (two) times daily. 180 tablet 3   clonazePAM (KLONOPIN) 0.5 MG tablet Take 1 tablet (0.5 mg total) by mouth 2 (two) times daily as needed for anxiety. 30 tablet 2   FARXIGA 10 MG TABS tablet Take 1 tablet (10 mg total) by mouth daily before breakfast. 90 tablet 3   fluticasone (FLONASE) 50 MCG/ACT nasal spray SPRAY 2 SPRAYS INTO EACH NOSTRIL EVERY DAY 48 mL 1   ipratropium (ATROVENT) 0.03 % nasal spray Place 2 sprays into both nostrils every 12 (twelve) hours. 30 mL 0   JANUVIA 100 MG tablet Take 1 tablet (100 mg total) by mouth daily. 90 tablet 3   levothyroxine (SYNTHROID) 137 MCG tablet Take 274 mcg by mouth daily before breakfast. Per Endocrinology     lisinopril (ZESTRIL) 20 MG tablet TAKE 1 TABLET BY MOUTH EVERY DAY 90 tablet 3   NEEDLE, DISP, 18 G (BD DISP  NEEDLES) 18G X 1-1/2" MISC 1 mg by Does not apply route every 14 (fourteen) days. 50 each 0   NEEDLE, DISP, 21 G (BD DISP NEEDLES) 21G X 1-1/2" MISC 1 mg by Does not apply route every 14 (fourteen) days. 50 each 0   ofloxacin (OCUFLOX) 0.3 % ophthalmic solution Place 1 drop into both eyes 4 (four) times daily. For 7 days 5 mL 0   sildenafil (REVATIO) 20 MG tablet Take 1-5 pills about 30 min prior to sex. Start with 1 and increase as needed. 90 tablet 3   Syringe, Disposable, (2-3CC SYRINGE) 3 ML MISC 1 mg by Does not apply route every 14 (fourteen) days. 25 each 3   TALTZ 80 MG/ML SOAJ Inject into the skin.     testosterone cypionate (DEPOTESTOSTERONE CYPIONATE) 200 MG/ML injection Inject 0.5 cc every 7 days 10 mL 0   triamcinolone (KENALOG) 0.025 % cream Apply topically daily as needed.     Vitamin D, Ergocalciferol, (DRISDOL) 1.25 MG (50000 UNIT) CAPS capsule Take 50,000 Units by mouth once a week.     No current facility-administered medications for this visit.    Allergies  Allergen Reactions   Gramineae Pollens    Mixed Grasses     Family History  Problem Relation Age of Onset   Hypertension Mother    Thyroid  cancer Mother     Social History   Socioeconomic History   Marital status: Married    Spouse name: Not on file   Number of children: Not on file   Years of education: Not on file   Highest education level: Not on file  Occupational History   Occupation: Football Coach    Comment: [employer]  Tobacco Use   Smoking status: Never    Passive exposure: Never   Smokeless tobacco: Never  Vaping Use   Vaping Use: Never used  Substance and Sexual Activity   Alcohol use: Yes    Comment: occasionally    Drug use: No   Sexual activity: Not on file  Other Topics Concern   Not on file  Social History Narrative   Not on file   Social Determinants of Health   Financial Resource Strain: Not on file  Food Insecurity: Not on file  Transportation Needs: Not on file   Physical Activity: Not on file  Stress: Not on file  Social Connections: Not on file  Intimate Partner Violence: Not on file     Constitutional: Denies fever, malaise, fatigue, headache or abrupt weight changes.  HEENT: Denies eye pain, eye redness, ear pain, ringing in the ears, wax buildup, runny nose, nasal congestion, bloody nose, or sore throat. Respiratory: Denies difficulty breathing, shortness of breath, cough or sputum production.   Cardiovascular: Denies chest pain, chest tightness, palpitations or swelling in the hands or feet.  Gastrointestinal: Denies abdominal pain, bloating, constipation, diarrhea or blood in the stool.  GU: Denies urgency, frequency, pain with urination, burning sensation, blood in urine, odor or discharge. Musculoskeletal: Denies decrease in range of motion, difficulty with gait, muscle pain or joint pain and swelling.  Skin: Denies redness, rashes, lesions or ulcercations.  Neurological: Denies dizziness, difficulty with memory, difficulty with speech or problems with balance and coordination.  Psych: Patient has a history of anxiety.  Denies depression, SI/HI.  No other specific complaints in a complete review of systems (except as listed in HPI above).   Objective:    Vision:  Uncorrected Corrected Horizontal Field of Vision  Right Eye 20/13  70 degrees  Left Eye  20/13  70 degrees  Both Eyes  20/13     Applicant can recognize and distinguish among traffic control signals and devices showing standard red, green, and amber colors.     Monocular Vision?: no   Hearing:        Right Ear  > 71ft     Left Ear  > 20ft         BP 124/76 (BP Location: Left Arm, Patient Position: Sitting, Cuff Size: Large)   Pulse 97   Temp (!) 96.7 F (35.9 C) (Temporal)   Ht 6\' 3"  (1.905 m)   Wt 293 lb (132.9 kg)   SpO2 99%   BMI 36.62 kg/m   Wt Readings from Last 3 Encounters:  11/27/22 293 lb (132.9 kg)  10/14/22 293 lb 9.6 oz (133.2 kg)   05/14/22 297 lb (134.7 kg)    General: Appears his stated age, obese in NAD. Skin: Warm, dry and intact. No rashes, lesions or ulcerations noted. HEENT: Head: normal shape and size; Eyes: sclera white, no icterus, conjunctiva pink, PERRLA and EOMs intact; Ears: Tm's gray and intact, normal light reflex; Nose: mucosa pink and moist, septum midline; Throat/Mouth: Teeth present, mucosa pink and moist, no exudate, lesions or ulcerations noted.  Neck:  Neck supple, trachea midline. No masses, lumps  or thyromegaly present.  Cardiovascular: Normal rate and rhythm. S1,S2 noted.  No murmur, rubs or gallops noted. No JVD or BLE edema.  Pulmonary/Chest: Normal effort and positive vesicular breath sounds. No respiratory distress. No wheezes, rales or ronchi noted.  Abdomen: Soft and nontender. Normal bowel sounds. No distention or masses noted. Liver, spleen and kidneys non palpable. Musculoskeletal: Normal range of motion. No signs of joint swelling. Strength 5/5 BUE/BLE. No difficulty with gait.  Neurological: Alert and oriented. Cranial nerves II-XII grossly intact. Coordination normal.  Psychiatric: Mood and affect normal. Behavior is normal. Judgment and thought content normal.    Labs:  Urinalysis:  Specific gravity: 1.005  Blood: negative  Protein: trace  Glucose: 1000  Assessment:    Healthy male exam.  Meets standards, but periodic monitoring required due to DM2, HTN.  Driver qualified only for 1 year.    Plan:    Medical examiners certificate completed and printed. Return as needed.   Nicki Reaper, NP

## 2023-03-26 ENCOUNTER — Other Ambulatory Visit: Payer: Self-pay | Admitting: Family Medicine

## 2023-03-26 DIAGNOSIS — F331 Major depressive disorder, recurrent, moderate: Secondary | ICD-10-CM

## 2023-03-26 NOTE — Telephone Encounter (Signed)
Requested Prescriptions  Pending Prescriptions Disp Refills   buPROPion (WELLBUTRIN XL) 300 MG 24 hr tablet [Pharmacy Med Name: BUPROPION HCL XL 300 MG TABLET] 90 tablet 0    Sig: TAKE 1 TABLET BY MOUTH EVERY DAY     Psychiatry: Antidepressants - bupropion Failed - 03/26/2023  2:20 AM      Failed - Cr in normal range and within 360 days    Creat  Date Value Ref Range Status  04/26/2019 1.12 0.60 - 1.35 mg/dL Final   Creatinine, Ser  Date Value Ref Range Status  01/17/2023 1.44 (H) 0.61 - 1.24 mg/dL Final   Creatinine, Urine  Date Value Ref Range Status  10/14/2022 78 20 - 320 mg/dL Final         Passed - AST in normal range and within 360 days    AST  Date Value Ref Range Status  01/17/2023 21 15 - 41 U/L Final         Passed - ALT in normal range and within 360 days    ALT  Date Value Ref Range Status  01/17/2023 22 0 - 44 U/L Final         Passed - Last BP in normal range    BP Readings from Last 1 Encounters:  03/25/23 124/76         Passed - Valid encounter within last 6 months    Recent Outpatient Visits           Yesterday Encounter for Department of Transportation (DOT) examination for driving license renewal   [facility], Salvadore Oxford, NP   5 months ago Type 2 diabetes mellitus with other specified complication, without long-term current use of insulin [facility])   [place] [facility], Netta Neat, DO   12 months ago Encounter for examination required by Department of Transportation (DOT)   [facility], Salvadore Oxford, NP   1 year ago Anxiety   [facility] Smitty Cords, DO   1 year ago Type 2 diabetes mellitus with other specified complication, without long-term current use of insulin [facility])   [place] [facility] Althea Charon, Netta Neat, DO       Future Appointments             In 1 month Althea Charon,  Netta Neat, DO [facility]

## 2023-04-07 ENCOUNTER — Other Ambulatory Visit: Payer: BC Managed Care – PPO

## 2023-04-14 ENCOUNTER — Encounter: Payer: BC Managed Care – PPO | Admitting: Family Medicine

## 2023-04-20 ENCOUNTER — Other Ambulatory Visit: Payer: Self-pay | Admitting: Urology

## 2023-04-20 DIAGNOSIS — E349 Endocrine disorder, unspecified: Secondary | ICD-10-CM

## 2023-04-23 ENCOUNTER — Other Ambulatory Visit: Payer: BC Managed Care – PPO

## 2023-04-29 ENCOUNTER — Encounter: Payer: Self-pay | Admitting: Family Medicine

## 2023-04-29 ENCOUNTER — Ambulatory Visit (INDEPENDENT_AMBULATORY_CARE_PROVIDER_SITE_OTHER): Payer: BC Managed Care – PPO | Admitting: Family Medicine

## 2023-04-29 VITALS — BP 108/76 | HR 77 | Temp 96.8°F | Ht 75.0 in | Wt 300.0 lb

## 2023-04-29 DIAGNOSIS — M1A09X0 Idiopathic chronic gout, multiple sites, without tophus (tophi): Secondary | ICD-10-CM

## 2023-04-29 DIAGNOSIS — Z0000 Encounter for general adult medical examination without abnormal findings: Secondary | ICD-10-CM

## 2023-04-29 DIAGNOSIS — F331 Major depressive disorder, recurrent, moderate: Secondary | ICD-10-CM | POA: Diagnosis not present

## 2023-04-29 DIAGNOSIS — I10 Essential (primary) hypertension: Secondary | ICD-10-CM

## 2023-04-29 DIAGNOSIS — E3122 Multiple endocrine neoplasia [MEN] type IIA: Secondary | ICD-10-CM

## 2023-04-29 DIAGNOSIS — E1169 Type 2 diabetes mellitus with other specified complication: Secondary | ICD-10-CM

## 2023-04-29 DIAGNOSIS — R7989 Other specified abnormal findings of blood chemistry: Secondary | ICD-10-CM

## 2023-04-29 DIAGNOSIS — Z125 Encounter for screening for malignant neoplasm of prostate: Secondary | ICD-10-CM

## 2023-04-29 DIAGNOSIS — F419 Anxiety disorder, unspecified: Secondary | ICD-10-CM | POA: Diagnosis not present

## 2023-04-29 DIAGNOSIS — E785 Hyperlipidemia, unspecified: Secondary | ICD-10-CM

## 2023-04-29 LAB — CBC WITH DIFFERENTIAL/PLATELET
Absolute Monocytes: 500 cells/uL (ref 200–950)
Basophils Absolute: 39 cells/uL (ref 0–200)
Basophils Relative: 0.8 %
Eosinophils Absolute: 162 cells/uL (ref 15–500)
Eosinophils Relative: 3.3 %
HCT: 54.6 % — ABNORMAL HIGH (ref 38.5–50.0)
Hemoglobin: 17.6 g/dL — ABNORMAL HIGH (ref 13.2–17.1)
Lymphs Abs: 1936 cells/uL (ref 850–3900)
MCH: 29.6 pg (ref 27.0–33.0)
MCHC: 32.2 g/dL (ref 32.0–36.0)
MCV: 91.9 fL (ref 80.0–100.0)
MPV: 10.2 fL (ref 7.5–12.5)
Monocytes Relative: 10.2 %
Neutro Abs: 2264 cells/uL (ref 1500–7800)
Neutrophils Relative %: 46.2 %
Platelets: 294 10*3/uL (ref 140–400)
RBC: 5.94 10*6/uL — ABNORMAL HIGH (ref 4.20–5.80)
RDW: 12.6 % (ref 11.0–15.0)
Total Lymphocyte: 39.5 %
WBC: 4.9 10*3/uL (ref 3.8–10.8)

## 2023-04-29 MED ORDER — BUPROPION HCL ER (XL) 300 MG PO TB24: 300.0000 mg | ORAL_TABLET | Freq: Every day | ORAL | 3 refills | Status: DC

## 2023-04-29 MED ORDER — CLONAZEPAM 0.5 MG PO TABS
0.5000 mg | ORAL_TABLET | Freq: Two times a day (BID) | ORAL | 2 refills | Status: DC | PRN
Start: 2023-04-29 — End: 2023-12-03

## 2023-04-29 NOTE — Progress Notes (Signed)
Subjective:    Patient ID: Aaron Schwartz, male    DOB: [birth year], 42 y.o.   MRN: [id number]  Aaron Schwartz is a 42 y.o. male presenting on 04/29/2023 for Annual Exam   HPI  Here for Annual Physical and Lab Review  CHRONIC DM, Type 2 / Morbid Obesity BMI >37 Improved A1c on last lab 8.5 CBGs: None recently Meds: Farxiga 10mg  daily, Januvia 100mg  daily tolerating well, staying hydrated. Currently on ACEi Lifestyle: Weight stable - Diet (improved appetite now, improved hydration) - Exercise (increased activity w/ football coaching) Last DM Eye Exam [facility] - request copy Denies hypoglycemia, polyuria, visual changes, numbness or tingling     Depression recurrent Anxiety   Improved on medication but not adequately controlling. Buspar 10mg  TWICE A DAY is not as effective. 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  Health Maintenance: Decline Tdap today      04/29/2023   11:21 AM 10/14/2022   10:50 PM 03/01/2022    3:50 PM  Depression screen PHQ 2/9  Decreased Interest 0 2 2  Down, Depressed, Hopeless 0 2 2  PHQ - 2 Score 0 4 4  Altered sleeping 1 3 3   Tired, decreased energy 1 1 1   Change in appetite 1 0 1  Feeling bad or failure about yourself  0 1 1  Trouble concentrating 0  2  Moving slowly or fidgety/restless 0 2 1  Suicidal thoughts 0 0 0  PHQ-9 Score 3 11 13   Difficult doing work/chores Not difficult at all Somewhat difficult Very difficult      04/29/2023   11:21 AM 10/14/2022   10:52 PM 03/01/2022    3:50 PM 12/28/2021   11:04 AM  GAD 7 : Generalized Anxiety Score  Nervous, Anxious, on Edge 1 3 3 2   Control/stop worrying 1 3 2 2    Worry too much - different things 1 2 2 2   Trouble relaxing 1 3 3 2   Restless 1 2 2 1   Easily annoyed or irritable 2 3 3 2   Afraid - awful might happen 1 2 2 3   Total GAD 7 Score 8 18 17 14   Anxiety Difficulty  Somewhat difficult Somewhat difficult       Past Medical History:  Diagnosis Date   Allergy    Asthma    Depression    Hypertension    Past Surgical History:  Procedure Laterality Date   none     Social History   Socioeconomic History   Marital status: Married    Spouse name: Not on file   Number of children: Not on file   Years of education: Not on file   Highest education level: Not on file  Occupational History   Occupation: Football Coach    Comment: [employer]  Tobacco Use   Smoking status: Never    Passive exposure: Never   Smokeless tobacco: Never  Vaping Use   Vaping status: Never Used  Substance and Sexual Activity   Alcohol use: Yes    Comment: occasionally    Drug use: No   Sexual activity: Not on file  Other Topics Concern   Not on file  Social History Narrative   Not on file  Social Determinants of Health   Financial Resource Strain: Not on file  Food Insecurity: Not on file  Transportation Needs: Not on file  Physical Activity: Not on file  Stress: Not on file  Social Connections: Not on file  Intimate Partner Violence: Not on file   Family History  Problem Relation Age of Onset   Hypertension Mother    Thyroid cancer Mother    Healthy Father    Healthy Brother    Current Outpatient Medications on File Prior to Visit  Medication Sig   albuterol (VENTOLIN HFA) 108 (90 Base) MCG/ACT inhaler TAKE 2 PUFFS BY MOUTH EVERY 6 HOURS AS NEEDED FOR WHEEZE OR SHORTNESS OF BREATH   allopurinol (ZYLOPRIM) 100 MG tablet TAKE 2 TABLETS BY MOUTH EVERY DAY   atorvastatin (LIPITOR) 10 MG tablet TAKE 1 TABLET BY MOUTH EVERYDAY AT BEDTIME   busPIRone (BUSPAR) 15 MG tablet Take 1 tablet (15 mg total) by mouth 2 (two) times daily.   FARXIGA 10  MG TABS tablet Take 1 tablet (10 mg total) by mouth daily before breakfast.   fluticasone (FLONASE) 50 MCG/ACT nasal spray SPRAY 2 SPRAYS INTO EACH NOSTRIL EVERY DAY   ipratropium (ATROVENT) 0.03 % nasal spray Place 2 sprays into both nostrils every 12 (twelve) hours.   JANUVIA 100 MG tablet Take 1 tablet (100 mg total) by mouth daily.   lisinopril (ZESTRIL) 20 MG tablet TAKE 1 TABLET BY MOUTH EVERY DAY   NEEDLE, DISP, 18 G (BD DISP NEEDLES) 18G X 1-1/2" MISC 1 mg by Does not apply route every 14 (fourteen) days.   NEEDLE, DISP, 21 G (BD DISP NEEDLES) 21G X 1-1/2" MISC 1 mg by Does not apply route every 14 (fourteen) days.   sildenafil (REVATIO) 20 MG tablet Take 1-5 pills about 30 min prior to sex. Start with 1 and increase as needed.   Syringe, Disposable, (2-3CC SYRINGE) 3 ML MISC 1 mg by Does not apply route every 14 (fourteen) days.   TALTZ 80 MG/ML SOAJ Inject into the skin.   testosterone cypionate (DEPOTESTOSTERONE CYPIONATE) 200 MG/ML injection INJECT 0.5ML INTRAMUSCULARLY EVERY 7 DAYS   triamcinolone (KENALOG) 0.025 % cream Apply topically daily as needed.   Vitamin D, Ergocalciferol, (DRISDOL) 1.25 MG (50000 UNIT) CAPS capsule Take 50,000 Units by mouth once a week.   levothyroxine (SYNTHROID) 137 MCG tablet Take 274 mcg by mouth daily before breakfast. Per Endocrinology   No current facility-administered medications on file prior to visit.    Review of Systems  Constitutional:  Negative for activity change, appetite change, chills, diaphoresis, fatigue and fever.  HENT:  Negative for congestion and hearing loss.   Eyes:  Negative for visual disturbance.  Respiratory:  Negative for cough, chest tightness, shortness of breath and wheezing.   Cardiovascular:  Negative for chest pain, palpitations and leg swelling.  Gastrointestinal:  Negative for abdominal pain, constipation, diarrhea, nausea and vomiting.  Genitourinary:  Negative for dysuria, frequency and hematuria.   Musculoskeletal:  Negative for arthralgias and neck pain.  Skin:  Negative for rash.  Neurological:  Negative for dizziness, weakness, light-headedness, numbness and headaches.  Hematological:  Negative for adenopathy.  Psychiatric/Behavioral:  Negative for behavioral problems, dysphoric mood and sleep disturbance.    Per HPI unless specifically indicated above      Objective:    BP 108/76 (BP Location: Left Arm, Patient Position: Sitting, Cuff Size: Large)   Pulse 77   Temp (!) 96.8 F (36 C) (Temporal)   Ht 6\' 3"  (  1.905 m)   Wt 300 lb (136.1 kg)   SpO2 97%   BMI 37.50 kg/m   Wt Readings from Last 3 Encounters:  04/29/23 300 lb (136.1 kg)  03/25/23 293 lb (132.9 kg)  11/27/22 293 lb (132.9 kg)    Physical Exam Vitals and nursing note reviewed.  Constitutional:      General: He is not in acute distress.    Appearance: He is well-developed. He is not diaphoretic.     Comments: Well-appearing, comfortable, cooperative  HENT:     Head: Normocephalic and atraumatic.  Eyes:     General:        Right eye: No discharge.        Left eye: No discharge.     Conjunctiva/sclera: Conjunctivae normal.     Pupils: Pupils are equal, round, and reactive to light.  Neck:     Thyroid: No thyromegaly.  Cardiovascular:     Rate and Rhythm: Normal rate and regular rhythm.     Pulses: Normal pulses.     Heart sounds: Normal heart sounds. No murmur heard. Pulmonary:     Effort: Pulmonary effort is normal. No respiratory distress.     Breath sounds: Normal breath sounds. No wheezing or rales.  Abdominal:     General: Bowel sounds are normal. There is no distension.     Palpations: Abdomen is soft. There is no mass.     Tenderness: There is no abdominal tenderness.  Musculoskeletal:        General: No tenderness. Normal range of motion.     Cervical back: Normal range of motion and neck supple.     Comments: Upper / Lower Extremities: - Normal muscle tone, strength bilateral upper  extremities 5/5, lower extremities 5/5  Lymphadenopathy:     Cervical: No cervical adenopathy.  Skin:    General: Skin is warm and dry.     Findings: No erythema or rash.  Neurological:     Mental Status: He is alert and oriented to person, place, and time.     Comments: Distal sensation intact to light touch all extremities  Psychiatric:        Mood and Affect: Mood normal.        Behavior: Behavior normal.        Thought Content: Thought content normal.     Comments: Well groomed, good eye contact, normal speech and thoughts      Results for orders placed or performed in visit on 03/25/23  POCT glycosylated hemoglobin (Hb A1C)  Result Value Ref Range   Hemoglobin A1C 8.5 (A) 4.0 - 5.6 %      Assessment & Plan:   Problem List Items Addressed This Visit     Anxiety    Controlled on BDZ Clonazepam and Wellbutrin No longer on SSRI      Relevant Medications   buPROPion (WELLBUTRIN XL) 300 MG 24 hr tablet   clonazePAM (KLONOPIN) 0.5 MG tablet   Hyperlipidemia associated with type 2 diabetes mellitus (HCC)    On Atorvastatin Check labs today      Relevant Orders   Lipid panel   Idiopathic chronic gout of multiple sites without tophus   Relevant Orders   Uric acid   Low 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   Relevant Orders   Testosterone   MEN 2A syndrome (HCC)    Followed by [facility]inology, [facility] and [facility]      Morbid obesity (HCC)    Stable weight overall  Continue improved lifestyle management for wt loss Comorbid conditions Diabetes, HLD impacting morbid obesity BMI >37      Type 2 diabetes mellitus with other specified complication (HCC)    A1c at 8.5 previously MEN2A thyroid/pheo Remains OFF Metformin Complications - hyperlipidemia inc risk of cardiovascular complication, obesity, gout OFF Metformin XR due to GI intolerance and concern w/ potential side effect Cannot take GLP1  Plan:  1. Continue Farxiga 10mg  daily, Januvia 100mg  daily 2.  Encourage improved lifestyle - low carb, low sugar diet, reduce portion size, continue improving regular exercise 3. Check CBG, bring log to next visit for review      Relevant Orders   Hemoglobin A1c   Other Visit Diagnoses     Annual physical exam    -  Primary   Relevant Orders   COMPLETE METABOLIC PANEL WITH GFR   Hemoglobin A1c   CBC with Differential/Platelet   Lipid panel   Moderate episode of recurrent major depressive disorder (HCC)       Relevant Medications   buPROPion (WELLBUTRIN XL) 300 MG 24 hr tablet   Essential hypertension       Relevant Orders   COMPLETE METABOLIC PANEL WITH GFR   CBC with Differential/Platelet   Screening for prostate cancer       Relevant Orders   PSA       Updated Health Maintenance information Fasting labs ordered today + added Testosterone lab for Urology, discussed w/ Michiel Cowboy NP today Encouraged improvement to lifestyle with diet and exercise Goal of weight loss  Medicines will be updated.  Next year Colon CA Screening Colonoscopy vs Cologuard  Orders Placed This Encounter  Procedures   COMPLETE METABOLIC PANEL WITH GFR   Hemoglobin A1c   CBC with Differential/Platelet   Lipid panel    Order Specific Question:   Has the patient fasted?    Answer:   Yes   PSA   Testosterone   Uric acid      Meds ordered this encounter  Medications   buPROPion (WELLBUTRIN XL) 300 MG 24 hr tablet    Sig: Take 1 tablet (300 mg total) by mouth daily.    Dispense:  90 tablet    Refill:  3    Add future refills, not ready to fill today   clonazePAM (KLONOPIN) 0.5 MG tablet    Sig: Take 1 tablet (0.5 mg total) by mouth 2 (two) times daily as needed for anxiety.    Dispense:  30 tablet    Refill:  2      Follow up plan: Return in about 6 months (around 10/30/2023) for 6 month follow-up.  Saralyn Pilar, DO [facility] 04/29/2023, 10:01 AM

## 2023-04-29 NOTE — Assessment & Plan Note (Signed)
On Atorvastatin Check labs today

## 2023-04-29 NOTE — Patient Instructions (Addendum)
Thank you for coming to the office today.  All labs today fasting lab, we will add the Testosterone for Urology.  Medicines will be updated.  Next year Colon CA Screening Colonoscopy vs Cologuard   Please schedule a Follow-up Appointment to: Return in about 6 months (around 10/30/2023) for 6 month follow-up.  If you have any other questions or concerns, please feel free to call the office or send a message through MyChart. You may also schedule an earlier appointment if necessary.  Additionally, you may be receiving a survey about your experience at our office within a few days to 1 week by e-mail or mail. We value your feedback.  Saralyn Pilar, DO [facility], [place]

## 2023-04-29 NOTE — Assessment & Plan Note (Addendum)
Controlled on BDZ Clonazepam and Wellbutrin No longer on SSRI

## 2023-04-29 NOTE — Assessment & Plan Note (Addendum)
Followed by [facility] Endocrinology, [facility] Surgical Oncology and [facility] ENT

## 2023-04-29 NOTE — Assessment & Plan Note (Signed)
Stable weight overall Continue improved lifestyle management for wt loss Comorbid conditions Diabetes, HLD impacting morbid obesity BMI >37

## 2023-04-29 NOTE — Assessment & Plan Note (Signed)
A1c at 8.5 previously MEN2A thyroid/pheo Remains OFF Metformin Complications - hyperlipidemia inc risk of cardiovascular complication, obesity, gout OFF Metformin XR due to GI intolerance and concern w/ potential side effect Cannot take GLP1  Plan:  1. Continue Farxiga 10mg  daily, Januvia 100mg  daily 2. Encourage improved lifestyle - low carb, low sugar diet, reduce portion size, continue improving regular exercise 3. Check CBG, bring log to next visit for review

## 2023-04-30 ENCOUNTER — Other Ambulatory Visit: Payer: BC Managed Care – PPO

## 2023-05-06 NOTE — Progress Notes (Signed)
05/07/2023 12:07 PM   Aaron Schwartz Nov 03, 1980 [id number]  Referring provider: Smitty Cords, DO [address] [place],  [place] [address]  Urological history: 1. Testosterone deficiency -Contributing factors of age, obesity and diabetes -testosterone level (03/2023) 287 -Hemoglobin/hematocrit (03/2023) 17.6/54.6 -testosterone cypionate 200 mg/mL, 0.5 cc every 7 days   2. Pheochromocytoma -right adrenalectomy, 2020 -followed by endocrinology - last visit 01/2022   3. BPH with LU TS -PSA (03/2023) 0.43 -I PSS 3/1   4. ED -contributing factors of age, BPH, diabetes, HTN, HLD, depression, anxiety, testosterone deficiency, COPD and alcohol consumption -sildenafil 20 mg, on-demand-dosing  Chief Complaint  Patient presents with   Hypogonadism   HPI: Aaron Schwartz is a 42 y.o. male who presents today for follow up.   Previous records reviewed.   I PSS 4/1  No urinary complaints.   Patient denies any modifying or aggravating factors.  Patient denies any recent UTI's, gross hematuria, dysuria or suprapubic/flank pain.  Patient denies any fevers, chills, nausea or vomiting.    IPSS     Row Name 05/07/23 1100         International Prostate Symptom Score   How often have you had the sensation of not emptying your bladder? Not at All     How often have you had to urinate less than every two hours? Less than half the time     How often have you found you stopped and started again several times when you urinated? Not at All     How often have you found it difficult to postpone urination? Less than 1 in 5 times     How often have you had a weak urinary stream? Not at All     How often have you had to strain to start urination? Not at All     How many times did you typically get up at night to urinate? 1 Time     Total IPSS Score 4       Quality of Life due to urinary symptoms   If you were to spend the rest of your life with your urinary condition just the way it  is now how would you feel about that? Pleased              IPSS     Row Name 05/07/23 1100         International Prostate Symptom Score   How often have you had the sensation of not emptying your bladder? Not at All     How often have you had to urinate less than every two hours? Less than half the time     How often have you found you stopped and started again several times when you urinated? Not at All     How often have you found it difficult to postpone urination? Less than 1 in 5 times     How often have you had a weak urinary stream? Not at All     How often have you had to strain to start urination? Not at All     How many times did you typically get up at night to urinate? 1 Time     Total IPSS Score 4       Quality of Life due to urinary symptoms   If you were to spend the rest of your life with your urinary condition just the way it is now how would you feel about that? Pleased  Score:  1-7 Mild 8-19 Moderate 20-35 Severe   SHIM 20   Patient still having spontaneous erections.  He denies any pain or curvature with erections.     SHIM     Row Name 05/07/23 1142         SHIM: Over the last 6 months:   How do you rate your confidence that you could get and keep an erection? Moderate     When you had erections with sexual stimulation, how often were your erections hard enough for penetration (entering your partner)? Most Times (much more than half the time)     During sexual intercourse, how often were you able to maintain your erection after you had penetrated (entered) your partner? Most Times (much more than half the time)     During sexual intercourse, how difficult was it to maintain your erection to completion of intercourse? Slightly Difficult     When you attempted sexual intercourse, how often was it satisfactory for you? Almost Always or Always       SHIM Total Score   SHIM 20              SHIM     Row Name 05/07/23 1142          SHIM: Over the last 6 months:   How do you rate your confidence that you could get and keep an erection? Moderate     When you had erections with sexual stimulation, how often were your erections hard enough for penetration (entering your partner)? Most Times (much more than half the time)     During sexual intercourse, how often were you able to maintain your erection after you had penetrated (entered) your partner? Most Times (much more than half the time)     During sexual intercourse, how difficult was it to maintain your erection to completion of intercourse? Slightly Difficult     When you attempted sexual intercourse, how often was it satisfactory for you? Almost Always or Always       SHIM Total Score   SHIM 20              Score: 1-7 Severe ED 8-11 Moderate ED 12-16 Mild-Moderate ED 17-21 Mild ED 22-25 No ED    PMH: Past Medical History:  Diagnosis Date   Allergy    Asthma    Depression    Hypertension     Surgical History: Past Surgical History:  Procedure Laterality Date   none      Home Medications:  Allergies as of 05/07/2023       Reactions   Gramineae Pollens    Mixed Grasses         Medication List        Accurate as of May 07, 2023 12:07 PM. If you have any questions, ask your nurse or doctor.          2-3CC SYRINGE 3 ML Misc 1 mg by Does not apply route every 14 (fourteen) days.   albuterol 108 (90 Base) MCG/ACT inhaler Commonly known as: VENTOLIN HFA TAKE 2 PUFFS BY MOUTH EVERY 6 HOURS AS NEEDED FOR WHEEZE OR SHORTNESS OF BREATH   allopurinol 100 MG tablet Commonly known as: ZYLOPRIM TAKE 2 TABLETS BY MOUTH EVERY DAY   atorvastatin 10 MG tablet Commonly known as: LIPITOR TAKE 1 TABLET BY MOUTH EVERYDAY AT BEDTIME   BD Disp Needles 18G X 1-1/2" Misc Generic drug: NEEDLE (DISP) 18 G 1 mg by Does not  apply route every 14 (fourteen) days.   BD Disp Needles 21G X 1-1/2" Misc Generic drug: NEEDLE (DISP) 21 G 1 mg by  Does not apply route every 14 (fourteen) days.   buPROPion 300 MG 24 hr tablet Commonly known as: WELLBUTRIN XL Take 1 tablet (300 mg total) by mouth daily.   busPIRone 15 MG tablet Commonly known as: BUSPAR Take 1 tablet (15 mg total) by mouth 2 (two) times daily.   clonazePAM 0.5 MG tablet Commonly known as: KLONOPIN Take 1 tablet (0.5 mg total) by mouth 2 (two) times daily as needed for anxiety.   Farxiga 10 MG Tabs tablet Generic drug: dapagliflozin propanediol Take 1 tablet (10 mg total) by mouth daily before breakfast.   fluticasone 50 MCG/ACT nasal spray Commonly known as: FLONASE SPRAY 2 SPRAYS INTO EACH NOSTRIL EVERY DAY   ipratropium 0.03 % nasal spray Commonly known as: ATROVENT Place 2 sprays into both nostrils every 12 (twelve) hours.   Januvia 100 MG tablet Generic drug: sitaGLIPtin Take 1 tablet (100 mg total) by mouth daily.   levothyroxine 137 MCG tablet Commonly known as: SYNTHROID Take 274 mcg by mouth daily before breakfast. Per Endocrinology   lisinopril 20 MG tablet Commonly known as: ZESTRIL TAKE 1 TABLET BY MOUTH EVERY DAY   sildenafil 20 MG tablet Commonly known as: REVATIO Take 1-5 pills about 30 min prior to sex. Start with 1 and increase as needed.   Taltz 80 MG/ML Soaj Generic drug: Ixekizumab Inject into the skin.   testosterone cypionate 200 MG/ML injection Commonly known as: DEPOTESTOSTERONE CYPIONATE INJECT 0.5ML INTRAMUSCULARLY EVERY 7 DAYS   triamcinolone 0.025 % cream Commonly known as: KENALOG Apply topically daily as needed.   Vitamin D (Ergocalciferol) 1.25 MG (50000 UNIT) Caps capsule Commonly known as: DRISDOL Take 50,000 Units by mouth once a week.        Allergies:  Allergies  Allergen Reactions   Gramineae Pollens    Mixed Grasses     Family History: Family History  Problem Relation Age of Onset   Hypertension Mother    Thyroid cancer Mother    Healthy Father    Healthy Brother     Social  History:  reports that he has never smoked. He has never been exposed to tobacco smoke. He has never used smokeless tobacco. He reports current alcohol use. He reports that he does not use drugs.  ROS: Pertinent ROS in HPI  Physical Exam: BP (!) 143/83   Pulse 76   Ht 6\' 3"  (1.905 m)   Wt 300 lb (136.1 kg)   BMI 37.50 kg/m   Constitutional:  Well nourished. Alert and oriented, No acute distress. HEENT: [place] AT, moist mucus membranes.  Trachea midline, no masses. Cardiovascular: No clubbing, cyanosis, or edema. Respiratory: Normal respiratory effort, no increased work of breathing. Neurologic: Grossly intact, no focal deficits, moving all 4 extremities. Psychiatric: Normal mood and affect.  Laboratory Data: Lab Results  Component Value Date   WBC 4.9 04/29/2023   HGB 17.6 (H) 04/29/2023   HCT 54.6 (H) 04/29/2023   MCV 91.9 04/29/2023   PLT 294 04/29/2023    Lab Results  Component Value Date   CREATININE 1.46 (H) 04/29/2023    Lab Results  Component Value Date   PSA 0.43 04/29/2023   PSA 0.3 04/26/2019    Lab Results  Component Value Date   TESTOSTERONE 278 04/29/2023    Lab Results  Component Value Date   HGBA1C 9.0 (H) 04/29/2023  Lab Results  Component Value Date   TSH 0.845 04/19/2015       Component Value Date/Time   CHOL 172 04/29/2023 1022   CHOL 278 (H) 01/07/2022 1010   HDL 44 04/29/2023 1022   HDL 49 01/07/2022 1010   CHOLHDL 3.9 04/29/2023 1022   VLDL 20 03/31/2017 0857   LDLCALC 107 (H) 04/29/2023 1022    Lab Results  Component Value Date   AST 20 04/29/2023   Lab Results  Component Value Date   ALT 18 04/29/2023  I have reviewed the labs.   Pertinent Imaging: N/A  Assessment & Plan:    1. Testosterone deficiency  -testosterone levels are subtherapeutic -H & H elevated -discontinue testosterone cypionate injections x 2 months, then restart his testosterone cypionate at 1/2 dose if H & H normalize  2. Erthrocytosis -he  deferred the option to donate blood, he deferred referral to hematology and deferred a sleep study  -Patient's HCT is 54% or greater.  We will need to discontinue the testosterone therapy for 2 months and recheck the HBG/HCT and testosterone levels.  If the HBG/HCT normalize, we will need to restart the testosterone therapy at a reduced dose.  If the HBG/HCT does not normalize or continues to increase even at a reduced dose, patient we need to evaluated for sleep apnea and/or hypoxia and testosterone therapy discontinued.     3. BPH with LUTS -PSA stable  4. Erectile dysfunction:    -continue sildenafil 20 mg, on-demand-dosing   Return in about 2 months (around 07/07/2023) for testosterone, Hemoglobin and Hematocrit .  These notes generated with voice recognition software. I apologize for typographical errors.  Cloretta Ned  [facility] [address]  [address] [place], [place] [address] [phone]

## 2023-05-07 ENCOUNTER — Encounter: Payer: Self-pay | Admitting: Urology

## 2023-05-07 ENCOUNTER — Ambulatory Visit: Payer: BC Managed Care – PPO | Admitting: Urology

## 2023-05-07 VITALS — BP 143/83 | HR 76 | Ht 75.0 in | Wt 300.0 lb

## 2023-05-07 DIAGNOSIS — D751 Secondary polycythemia: Secondary | ICD-10-CM | POA: Diagnosis not present

## 2023-05-07 DIAGNOSIS — N529 Male erectile dysfunction, unspecified: Secondary | ICD-10-CM

## 2023-05-07 DIAGNOSIS — E291 Testicular hypofunction: Secondary | ICD-10-CM | POA: Diagnosis not present

## 2023-05-07 DIAGNOSIS — N401 Enlarged prostate with lower urinary tract symptoms: Secondary | ICD-10-CM

## 2023-05-07 DIAGNOSIS — N138 Other obstructive and reflux uropathy: Secondary | ICD-10-CM

## 2023-06-11 ENCOUNTER — Other Ambulatory Visit: Payer: Self-pay | Admitting: Urology

## 2023-06-11 DIAGNOSIS — E349 Endocrine disorder, unspecified: Secondary | ICD-10-CM

## 2023-08-27 ENCOUNTER — Encounter: Payer: Self-pay | Admitting: Family Medicine

## 2023-08-27 NOTE — Telephone Encounter (Signed)
Care team updated and letter sent for eye exam notes.

## 2023-09-01 LAB — HM DIABETES EYE EXAM

## 2023-10-05 ENCOUNTER — Other Ambulatory Visit: Payer: Self-pay | Admitting: Family Medicine

## 2023-10-05 DIAGNOSIS — F419 Anxiety disorder, unspecified: Secondary | ICD-10-CM

## 2023-10-07 ENCOUNTER — Other Ambulatory Visit: Payer: Self-pay | Admitting: Family Medicine

## 2023-10-07 DIAGNOSIS — J209 Acute bronchitis, unspecified: Secondary | ICD-10-CM

## 2023-10-07 DIAGNOSIS — J302 Other seasonal allergic rhinitis: Secondary | ICD-10-CM

## 2023-10-07 NOTE — Telephone Encounter (Signed)
 Appointment 10/19/22 Requested Prescriptions  Pending Prescriptions Disp Refills   busPIRone  (BUSPAR ) 15 MG tablet [Pharmacy Med Name: BUSPIRONE  HCL 15 MG TABLET] 180 tablet 0    Sig: TAKE 1 TABLET BY MOUTH 2 TIMES DAILY.     Psychiatry: Anxiolytics/Hypnotics - Non-controlled Passed - 10/07/2023  3:43 PM      Passed - Valid encounter within last 12 months    Recent Outpatient Visits           5 months ago Annual physical exam   [place] [facility] [place], Marsa PARAS, DO   6 months ago Encounter for Department of Transportation (DOT) examination for driving license renewal   [place] [facility] [place], Angeline ORN, NP   11 months ago Type 2 diabetes mellitus with other specified complication, without long-term current use of insulin [facility])   [place] [facility] [place] Marsa PARAS, DO   1 year ago Encounter for examination required by Department of Transportation (DOT)   [facility] [place], Angeline ORN, NP   1 year ago Anxiety   [place] [facility] [place] Marsa PARAS, DO       Future Appointments             In 1 week [place], Marsa PARAS, DO [place] [facility], [facility]

## 2023-10-09 NOTE — Telephone Encounter (Signed)
 Requested Prescriptions  Pending Prescriptions Disp Refills   fluticasone  (FLONASE ) 50 MCG/ACT nasal spray [Pharmacy Med Name: FLUTICASONE  PROP 50 MCG SPRAY] 48 mL 1    Sig: SPRAY 2 SPRAYS INTO EACH NOSTRIL EVERY DAY     Ear, Nose, and Throat: Nasal Preparations - Corticosteroids Passed - 10/09/2023  9:39 AM      Passed - Valid encounter within last 12 months    Recent Outpatient Visits           5 months ago Annual physical exam   [place] [facility] [place], Marsa PARAS, DO   6 months ago Encounter for Department of Transportation (DOT) examination for driving license renewal   [place] [facility] [place], [place] W, NP   12 months ago Type 2 diabetes mellitus with other specified complication, without long-term current use of insulin [facility])   [place] [facility] [place] Marsa PARAS, DO   1 year ago Encounter for examination required by Department of Transportation (DOT)   [facility] [place], Angeline ORN, NP   1 year ago Anxiety   [place] [facility] [place] Marsa PARAS, DO       Future Appointments             In 1 week Edman, Marsa PARAS, DO [place] [facility], [facility]

## 2023-10-20 ENCOUNTER — Ambulatory Visit: Payer: 59 | Admitting: Family Medicine

## 2023-10-20 ENCOUNTER — Encounter: Payer: Self-pay | Admitting: Family Medicine

## 2023-10-20 ENCOUNTER — Other Ambulatory Visit: Payer: Self-pay | Admitting: Family Medicine

## 2023-10-20 VITALS — BP 132/78 | HR 95 | Ht 75.0 in | Wt 298.0 lb

## 2023-10-20 DIAGNOSIS — I10 Essential (primary) hypertension: Secondary | ICD-10-CM | POA: Diagnosis not present

## 2023-10-20 DIAGNOSIS — E1169 Type 2 diabetes mellitus with other specified complication: Secondary | ICD-10-CM

## 2023-10-20 DIAGNOSIS — F3341 Major depressive disorder, recurrent, in partial remission: Secondary | ICD-10-CM | POA: Insufficient documentation

## 2023-10-20 DIAGNOSIS — M1A09X0 Idiopathic chronic gout, multiple sites, without tophus (tophi): Secondary | ICD-10-CM

## 2023-10-20 DIAGNOSIS — Z0000 Encounter for general adult medical examination without abnormal findings: Secondary | ICD-10-CM

## 2023-10-20 DIAGNOSIS — E785 Hyperlipidemia, unspecified: Secondary | ICD-10-CM

## 2023-10-20 DIAGNOSIS — F419 Anxiety disorder, unspecified: Secondary | ICD-10-CM

## 2023-10-20 DIAGNOSIS — Z125 Encounter for screening for malignant neoplasm of prostate: Secondary | ICD-10-CM

## 2023-10-20 DIAGNOSIS — E3122 Multiple endocrine neoplasia [MEN] type IIA: Secondary | ICD-10-CM

## 2023-10-20 LAB — POCT GLYCOSYLATED HEMOGLOBIN (HGB A1C): Hemoglobin A1C: 8.4 % — AB (ref 4.0–5.6)

## 2023-10-20 MED ORDER — JANUVIA 100 MG PO TABS
100.0000 mg | ORAL_TABLET | Freq: Every day | ORAL | 3 refills | Status: AC
Start: 2023-10-20 — End: ?

## 2023-10-20 MED ORDER — FARXIGA 10 MG PO TABS
10.0000 mg | ORAL_TABLET | Freq: Every day | ORAL | 3 refills | Status: AC
Start: 2023-10-20 — End: ?

## 2023-10-20 NOTE — Patient Instructions (Addendum)
Thank you for coming to the office today.  Refills sent.  Recent Labs    03/25/23 1026 04/29/23 1022 10/20/23 1115  HGBA1C 8.5* 9.0* 8.4*    You have been referred for a Coronary Calcium Score Cardiac CT Scan. This is a screening test for patients aged 45-50+ with cardiovascular risk factors or who are healthy but would be interested in Cardiovascular Screening for heart disease. Even if there is a family history of heart disease, this imaging can be useful. Typically it can be done every 5+ years or at a different timeline we agree on  The scan will look at the chest and mainly focus on the heart and identify early signs of calcium build up or blockages within the heart arteries. It is not 100% accurate for identifying blockages or heart disease, but it is useful to help [place] predict who may have some early changes or be at risk in the future for a heart attack or cardiovascular problem.  The results are reviewed by a Cardiologist and they will document the results. It should become available on MyChart. Typically the results are divided into percentiles based on other patients of the same demographic and age. So it will compare your risk to others similar to you. If you have a higher score >99 or higher percentile >75%tile, it is recommended to consider Statin cholesterol therapy and or referral to Cardiologist. I will try to help explain your results and if we have questions we can contact the Cardiologist.  You will be contacted for scheduling. Usually it is done at any imaging facility through [facility], [facility] or [facility].  The cost is $99 flat fee total and it does not go through insurance, so no authorization is required.    DUE for FASTING BLOOD WORK (no food or drink after midnight before the lab appointment, only water or coffee without cream/sugar on the morning of)  SCHEDULE "Lab Only" visit in the morning at the clinic for lab draw in 6  MONTHS   - Make sure Lab Only appointment is at about 1 week before your next appointment, so that results will be available  For Lab Results, once available within 2-3 days of blood draw, you can can log in to MyChart online to view your results and a brief explanation. Also, we can discuss results at next follow-up visit.   Please schedule a Follow-up Appointment to: Return for 6 month fasting lab > 1 week later Annual Physical.  If you have any other questions or concerns, please feel free to call the office or send a message through MyChart. You may also schedule an earlier appointment if necessary.  Additionally, you may be receiving a survey about your experience at our office within a few days to 1 week by e-mail or mail. We value your feedback.  Saralyn Pilar, DO [facility] [place], [place]

## 2023-10-20 NOTE — Progress Notes (Signed)
Subjective:    Patient ID: Aaron Schwartz, male    DOB: 1981-04-22, 43 y.o.   MRN: [id number]  Aaron Schwartz is a 43 y.o. male presenting on 10/20/2023 for Diabetes   HPI  Discussed the use of AI scribe software for clinical note transcription with the patient, who gave verbal consent to proceed.  History of Present Illness    The patient, with a history of diabetes, presented for a routine six-month follow-up.   CHRONIC DM, Type 2 / Morbid Obesity BMI >37 The patient's most recent HbA1c showed a slight improvement from 9.0 to 8.4, despite admitting to dietary indiscretions during the holiday season. He expressed relief that his HbA1c had not increased and acknowledged the need to return to healthier eating habits. Meds: Farxiga 10mg  daily, Januvia 100mg  daily tolerating well, staying hydrated. 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 Followed by Endocrine On Thyroid medication levothyroxine  Health Maintenance:  Future Prevnar-20 vaccine     10/20/2023   11:10 AM 04/29/2023   11:21 AM 10/14/2022   10:50 PM  Depression screen PHQ 2/9  Decreased Interest 0 0 2  Down, Depressed, Hopeless 0 0 2  PHQ - 2 Score 0 0 4  Altered sleeping 1 1 3   Tired, decreased energy 1 1 1   Change in appetite 1 1 0  Feeling bad or failure about yourself  0 0 1  Trouble concentrating 0 0   Moving slowly  or fidgety/restless 0 0 2  Suicidal thoughts 0 0 0  PHQ-9 Score 3 3 11   Difficult doing work/chores Not difficult at all Not difficult at all Somewhat difficult       10/20/2023   11:10 AM 04/29/2023   11:21 AM 10/14/2022   10:52 PM 03/01/2022    3:50 PM  GAD 7 : Generalized Anxiety Score  Nervous, Anxious, on Edge 1 1 3 3   Control/stop worrying 1 1 3 2   Worry too much - different things 1 1 2 2   Trouble relaxing 1 1 3 3   Restless 1 1 2 2   Easily annoyed or irritable 1 2 3 3   Afraid - awful might happen 0 1 2 2   Total GAD 7 Score 6 8 18 17   Anxiety Difficulty Somewhat difficult  Somewhat difficult Somewhat difficult    Social History   Tobacco Use   Smoking status: Never    Passive exposure: Never   Smokeless tobacco: Never  Vaping Use   Vaping status: Never Used  Substance Use Topics   Alcohol use: Yes    Comment: occasionally    Drug use: No    Review of Systems Per HPI unless specifically indicated above     Objective:    BP 132/78   Pulse 95   Ht 6\' 3"  (1.905 m)   Wt 298 lb (135.2 kg)   SpO2 95%   BMI 37.25 kg/m   Wt Readings from Last 3 Encounters:  10/20/23 298 lb (135.2 kg)  05/07/23 300 lb (136.1  kg)  04/29/23 300 lb (136.1 kg)    Physical Exam Vitals and nursing note reviewed.  Constitutional:      General: He is not in acute distress.    Appearance: He is well-developed. He is obese. He is not diaphoretic.     Comments: Well-appearing, comfortable, cooperative  HENT:     Head: Normocephalic and atraumatic.  Eyes:     General:        Right eye: No discharge.        Left eye: No discharge.     Conjunctiva/sclera: Conjunctivae normal.  Neck:     Thyroid: No thyromegaly.  Cardiovascular:     Rate and Rhythm: Normal rate and regular rhythm.     Pulses: Normal pulses.     Heart sounds: Normal heart sounds. No murmur heard. Pulmonary:     Effort: Pulmonary effort is normal. No respiratory distress.     Breath sounds: Normal breath sounds. No  wheezing or rales.  Musculoskeletal:        General: Normal range of motion.     Cervical back: Normal range of motion and neck supple.     Right lower leg: No edema.     Left lower leg: No edema.  Lymphadenopathy:     Cervical: No cervical adenopathy.  Skin:    General: Skin is warm and dry.     Findings: No erythema or rash.  Neurological:     Mental Status: He is alert and oriented to person, place, and time. Mental status is at baseline.  Psychiatric:        Behavior: Behavior normal.     Comments: Well groomed, good eye contact, normal speech and thoughts    Diabetic Foot Exam - Simple   Simple Foot Form Diabetic Foot exam was performed with the following findings: Yes 10/20/2023 11:34 AM  Visual Inspection See comments: Yes Sensation Testing Intact to touch and monofilament testing bilaterally: Yes Pulse Check Posterior Tibialis and Dorsalis pulse intact bilaterally: Yes Comments Bilateral dry skin. Mild callus corn R foot lateral. No ulceration. Intact to monofilament.      Results for orders placed or performed in visit on 10/20/23  POCT HgB A1C   Collection Time: 10/20/23 11:15 AM  Result Value Ref Range   Hemoglobin A1C 8.4 (A) 4.0 - 5.6 %   HbA1c POC (<> result, manual entry)     HbA1c, POC (prediabetic range)     HbA1c, POC (controlled diabetic range)        Assessment & Plan:   Problem List Items Addressed This Visit     Anxiety   Hyperlipidemia associated with type 2 diabetes mellitus (HCC)   Relevant Medications   JANUVIA 100 MG tablet   FARXIGA 10 MG TABS tablet   Other Relevant Orders   CT CARDIAC SCORING (SELF PAY ONLY)   Major depressive disorder, recurrent episode, in partial remission (HCC)   Morbid obesity (HCC)   Relevant Medications   JANUVIA 100 MG tablet   FARXIGA 10 MG TABS tablet   Type 2 diabetes mellitus with other specified complication (HCC) - Primary   Relevant Medications   JANUVIA 100 MG tablet   FARXIGA 10 MG TABS tablet    Other Relevant Orders   POCT HgB A1C (Completed)   CT CARDIAC SCORING (SELF PAY ONLY)   Other Visit Diagnoses       Essential hypertension       Relevant Orders   CT CARDIAC SCORING (SELF PAY ONLY)  Diabetes Mellitus Type 2 Improved glycemic control with HbA1c down from 9.0 to 8.4. Discussed the importance of consistent medication adherence and lifestyle modifications. -Continue current regimen of Januvia 100mg  and Farxiga 10mg  daily, refill 90 day DM Foot exam today DM Eye updated  Obesity and Hyperlipidemia Continue therapy and lifestyle modification  Anxiety Major Depression recurrent partial remission Improved control Continue current therapy  Cardiovascular Risk Discussed the utility of a coronary artery calcium score for risk stratification in patients with diabetes. -Order coronary artery calcium score CT  Vaccination Discussed the updated guidelines for pneumonia vaccination in patients with diabetes. Patient hesitant about vaccinations. -Consider Prevnar 20 vaccine at next annual visit.  Follow-up Next appointment scheduled for April 23, 2024, with blood and urine tests to be done on April 16, 2024.         Orders Placed This Encounter  Procedures   CT CARDIAC SCORING (SELF PAY ONLY)    Standing Status:   Future    Expiration Date:   10/19/2024    Preferred imaging location?:   [facility]   POCT HgB A1C    Meds ordered this encounter  Medications   JANUVIA 100 MG tablet    Sig: Take 1 tablet (100 mg total) by mouth daily.    Dispense:  90 tablet    Refill:  3    90 day   FARXIGA 10 MG TABS tablet    Sig: Take 1 tablet (10 mg total) by mouth daily before breakfast.    Dispense:  90 tablet    Refill:  3    Follow up plan: Return for 6 month fasting lab > 1 week later Annual Physical.  Future labs ordered for  04/16/24  Future add Urine microalbumin  Saralyn Pilar, DO [facility] 10/20/2023, 11:22 AM

## 2023-10-27 ENCOUNTER — Other Ambulatory Visit: Payer: Self-pay

## 2023-11-20 ENCOUNTER — Other Ambulatory Visit: Payer: Self-pay | Admitting: Family Medicine

## 2023-11-20 DIAGNOSIS — I10 Essential (primary) hypertension: Secondary | ICD-10-CM

## 2023-11-20 NOTE — Telephone Encounter (Signed)
Requested medication (s) are due for refill today:   Yes  Requested medication (s) are on the active medication list:   Yes  Future visit scheduled:   Yes 04/16/2024   Last ordered: 12/10/2022 #90, 3 refills  Unable to refill because labs are due   Requested Prescriptions  Pending Prescriptions Disp Refills   lisinopril (ZESTRIL) 20 MG tablet [Pharmacy Med Name: LISINOPRIL 20 MG TABLET] 90 tablet 3    Sig: TAKE 1 TABLET BY MOUTH EVERY DAY     Cardiovascular:  ACE Inhibitors Failed - 11/20/2023 12:57 PM      Failed - Cr in normal range and within 180 days    Creat  Date Value Ref Range Status  04/29/2023 1.46 (H) 0.60 - 1.29 mg/dL Final   Creatinine, Urine  Date Value Ref Range Status  10/14/2022 78 20 - 320 mg/dL Final         Failed - K in normal range and within 180 days    Potassium  Date Value Ref Range Status  04/29/2023 5.4 (H) 3.5 - 5.3 mmol/L Final         Passed - Patient is not pregnant      Passed - Last BP in normal range    BP Readings from Last 1 Encounters:  10/20/23 132/78         Passed - Valid encounter within last 6 months    Recent Outpatient Visits           1 month ago Type 2 diabetes mellitus with other specified complication, without long-term current use of insulin (HCC)   [place] [facility] Smitty Cords, DO   6 months ago Annual physical exam   [place] [facility] [place], Netta Neat, DO   8 months ago Encounter for Department of Transportation (DOT) examination for driving license renewal   [place] [facility] [place], [place] W, NP   1 year ago Type 2 diabetes mellitus with other specified complication, without long-term current use of insulin [facility])   [place] [facility] Smitty Cords, DO   1 year ago Encounter for examination required by Department of Transportation (DOT)   [facility] [place], Salvadore [place], NP        Future Appointments             In 5 months Althea Charon, Netta Neat, DO [place] [facility], [facility]

## 2023-12-02 ENCOUNTER — Other Ambulatory Visit: Payer: Self-pay | Admitting: Family Medicine

## 2023-12-02 DIAGNOSIS — F419 Anxiety disorder, unspecified: Secondary | ICD-10-CM

## 2023-12-02 NOTE — Telephone Encounter (Signed)
 Requested medication (s) are due for refill today: Yes  Requested medication (s) are on the active medication list: Yes  Last refill:  04/29/23  Future visit scheduled: Yes  Notes to clinic:  Unable to refill per protocol, cannot delegate.      Requested Prescriptions  Pending Prescriptions Disp Refills   clonazePAM (KLONOPIN) 0.5 MG tablet [Pharmacy Med Name: CLONAZEPAM 0.5 MG TABLET] 30 tablet     Sig: TAKE 1 TABLET BY MOUTH 2 TIMES DAILY AS NEEDED FOR ANXIETY.     Not Delegated - Psychiatry: Anxiolytics/Hypnotics 2 Failed - 12/02/2023  5:17 PM      Failed - This refill cannot be delegated      Failed - Urine Drug Screen completed in last 360 days      Passed - Patient is not pregnant      Passed - Valid encounter within last 6 months    Recent Outpatient Visits           1 month ago Type 2 diabetes mellitus with other specified complication, without long-term current use of insulin [facility])   [place] [facility] [place], Netta Neat, DO   7 months ago Annual physical exam   [place] [facility] [place], Netta Neat, DO   8 months ago Encounter for Department of Transportation (DOT) examination for driving license renewal   [place] [facility] [place], [place] W, NP   1 year ago Type 2 diabetes mellitus with other specified complication, without long-term current use of insulin [facility])   [place] [facility] Smitty Cords, DO   1 year ago Encounter for examination required by Department of Transportation (DOT)   [facility], Salvadore [place], NP       Future Appointments             In 4 months Althea Charon, Netta Neat, DO [facility], [facility]

## 2023-12-13 ENCOUNTER — Encounter: Payer: Self-pay | Admitting: Family Medicine

## 2023-12-15 ENCOUNTER — Telehealth (INDEPENDENT_AMBULATORY_CARE_PROVIDER_SITE_OTHER): Payer: Self-pay | Admitting: Family Medicine

## 2023-12-15 ENCOUNTER — Encounter: Payer: Self-pay | Admitting: Family Medicine

## 2023-12-15 DIAGNOSIS — J0110 Acute frontal sinusitis, unspecified: Secondary | ICD-10-CM

## 2023-12-15 MED ORDER — AMOXICILLIN-POT CLAVULANATE 875-125 MG PO TABS
1.0000 | ORAL_TABLET | Freq: Two times a day (BID) | ORAL | 0 refills | Status: DC
Start: 2023-12-15 — End: 2024-04-27

## 2023-12-15 NOTE — Progress Notes (Addendum)
 Subjective:    Patient ID: Aaron Schwartz, male    DOB: 1981-06-17, 43 y.o.   MRN: [id number]  Aaron Schwartz is a 43 y.o. male presenting on 12/15/2023 for Sinusitis   Virtual / Telehealth Encounter - Video Visit via MyChart The purpose of this virtual visit is to provide medical care while limiting exposure to the novel coronavirus (COVID19) for both patient and office staff.  Consent was obtained for remote visit:  Yes.   Answered questions that patient had about telehealth interaction:  Yes.   I discussed the limitations, risks, security and privacy concerns of performing an evaluation and management service by video/telephone. I also discussed with the patient that there may be a patient responsible charge related to this service. The patient expressed understanding and agreed to proceed.  Patient Location: Work/Gym Provider Location: [facility] (Office)  Participants in virtual visit: - Patient: Aaron Schwartz - CMA: Fuller Plan CMA - Provider: Dr Althea Charon   HPI  Discussed the use of AI scribe software for clinical note transcription with the patient, who gave verbal consent to proceed.  History of Present Illness   Aaron Schwartz is a 43 year old male who presents with sinus symptoms persisting for over a week.  He has been experiencing sinus symptoms for over a week, starting from the Sunday before last. Symptoms include a headache and nasal congestion, with alternating nostril blockage. He has been blowing clear mucus and occasionally blood, with congestion primarily in the head and nose area. No cough is present, and symptoms are localized to the head.  He has tried various over-the-counter medications, including Sudafed, DayQuil, NyQuil, and Flonase nasal spray, which have provided some relief but not complete resolution. Recently, he started taking Mucinex, which was obtained by his wife.  He has not experienced similar symptoms since 2021, when  he required antibiotics for a similar issue.          10/20/2023   11:10 AM 04/29/2023   11:21 AM 10/14/2022   10:50 PM  Depression screen PHQ 2/9  Decreased Interest 0 0 2  Down, Depressed, Hopeless 0 0 2  PHQ - 2 Score 0 0 4  Altered sleeping 1 1 3   Tired, decreased energy 1 1 1   Change in appetite 1 1 0  Feeling bad or failure about yourself  0 0 1  Trouble concentrating 0 0   Moving slowly or fidgety/restless 0 0 2  Suicidal thoughts 0 0 0  PHQ-9 Score 3 3 11   Difficult doing work/chores Not difficult at all Not difficult at all Somewhat difficult       10/20/2023   11:10 AM 04/29/2023   11:21 AM 10/14/2022   10:52 PM 03/01/2022    3:50 PM  GAD 7 : Generalized Anxiety Score  Nervous, Anxious, on Edge 1 1 3 3   Control/stop worrying 1 1 3 2   Worry too much - different things 1 1 2 2   Trouble relaxing 1 1 3 3   Restless 1 1 2 2   Easily annoyed or irritable 1 2 3 3   Afraid - awful might happen 0 1 2 2   Total GAD 7 Score 6 8 18 17   Anxiety Difficulty Somewhat difficult  Somewhat difficult Somewhat difficult    Social History   Tobacco Use   Smoking status: Never    Passive exposure: Never   Smokeless tobacco: Never  Vaping Use   Vaping status: Never Used  Substance Use Topics   Alcohol use:  Yes    Comment: occasionally    Drug use: No    Review of Systems Per HPI unless specifically indicated above     Objective:    There were no vitals taken for this visit.  Wt Readings from Last 3 Encounters:  10/20/23 298 lb (135.2 kg)  05/07/23 300 lb (136.1 kg)  04/29/23 300 lb (136.1 kg)     Physical Exam  Note examination was completely remotely via video observation objective data only  Gen - well-appearing, no acute distress or apparent pain, comfortable HEENT - eyes appear clear without discharge or redness Heart/Lungs - cannot examine virtually - observed no evidence of coughing or labored breathing. Abd - cannot examine virtually  Skin - face visible  today- no rash Neuro - awake, alert, oriented Psych - not anxious appearing   Results for orders placed or performed in visit on 10/20/23  POCT HgB A1C   Collection Time: 10/20/23 11:15 AM  Result Value Ref Range   Hemoglobin A1C 8.4 (A) 4.0 - 5.6 %   HbA1c POC (<> result, manual entry)     HbA1c, POC (prediabetic range)     HbA1c, POC (controlled diabetic range)        Assessment & Plan:   Problem List Items Addressed This Visit   None Visit Diagnoses       Acute non-recurrent frontal sinusitis    -  Primary   Relevant Medications   amoxicillin-clavulanate (AUGMENTIN) 875-125 MG tablet        Acute Sinusitis Symptoms persistent despite OTC treatments. Augmentin prescribed due to symptom duration and lack of improvement. - Prescribe Augmentin for 10 days, twice daily. - Continue Flonase nasal spray. - Use Mucinex PRN for decongestion.         No orders of the defined types were placed in this encounter.   Meds ordered this encounter  Medications   amoxicillin-clavulanate (AUGMENTIN) 875-125 MG tablet    Sig: Take 1 tablet by mouth 2 (two) times daily.    Dispense:  20 tablet    Refill:  0    Follow up plan: Return if symptoms worsen or fail to improve.   Patient verbalizes understanding with the above medical recommendations including the limitation of remote medical advice.  Specific follow-up and call-back criteria were given for patient to follow-up or seek medical care more urgently if needed.  Total duration of direct patient care provided via video conference: 7 minutes   Saralyn Pilar, DO [facility] 12/15/2023, 4:08 PM

## 2023-12-15 NOTE — Patient Instructions (Addendum)

## 2024-01-03 ENCOUNTER — Other Ambulatory Visit: Payer: Self-pay | Admitting: Internal Medicine

## 2024-01-03 ENCOUNTER — Other Ambulatory Visit: Payer: Self-pay | Admitting: Family Medicine

## 2024-01-03 DIAGNOSIS — E1169 Type 2 diabetes mellitus with other specified complication: Secondary | ICD-10-CM

## 2024-01-03 DIAGNOSIS — E785 Hyperlipidemia, unspecified: Secondary | ICD-10-CM

## 2024-01-03 DIAGNOSIS — F419 Anxiety disorder, unspecified: Secondary | ICD-10-CM

## 2024-01-05 NOTE — Telephone Encounter (Signed)
 Requested Prescriptions  Pending Prescriptions Disp Refills   atorvastatin (LIPITOR) 10 MG tablet [Pharmacy Med Name: ATORVASTATIN 10 MG TABLET] 90 tablet 0    Sig: TAKE 1 TABLET BY MOUTH EVERYDAY AT BEDTIME     Cardiovascular:  Antilipid - Statins Failed - 01/05/2024  2:42 PM      Failed - Valid encounter within last 12 months    Recent Outpatient Visits           3 weeks ago Acute non-recurrent frontal sinusitis   [place] [facility] Smitty Cords, DO       Future Appointments             In 3 months Althea Charon, Netta Neat, DO [place] [facility], [facility]            Failed - Lipid Panel in normal range within the last 12 months    Cholesterol, Total  Date Value Ref Range Status  01/07/2022 278 (H) 100 - 199 mg/dL Final   Cholesterol  Date Value Ref Range Status  04/29/2023 172 <200 mg/dL Final   LDL Cholesterol (Calc)  Date Value Ref Range Status  04/29/2023 107 (H) mg/dL (calc) Final    Comment:    Reference range: <100 . Desirable range <100 mg/dL for primary prevention;   <70 mg/dL for patients with CHD or diabetic patients  with > or = 2 CHD risk factors. Marland Kitchen LDL-C is now calculated using the Martin-Hopkins  calculation, which is a validated novel method providing  better accuracy than the Friedewald equation in the  estimation of LDL-C.  Horald Pollen et al. Lenox Ahr. 0981;191(47): 2061-2068  (http://education.[employer].com/faq/FAQ164)    HDL  Date Value Ref Range Status  04/29/2023 44 > OR = 40 mg/dL Final  82/95/6213 49 >08 mg/dL Final   Triglycerides  Date Value Ref Range Status  04/29/2023 118 <150 mg/dL Final         Passed - Patient is not pregnant

## 2024-01-05 NOTE — Telephone Encounter (Signed)
 Requested Prescriptions  Pending Prescriptions Disp Refills   busPIRone (BUSPAR) 15 MG tablet [Pharmacy Med Name: BUSPIRONE HCL 15 MG TABLET] 180 tablet 0    Sig: TAKE 1 TABLET BY MOUTH TWICE A DAY     Psychiatry: Anxiolytics/Hypnotics - Non-controlled Failed - 01/05/2024  2:38 PM      Failed - Valid encounter within last 12 months    Recent Outpatient Visits           3 weeks ago Acute non-recurrent frontal sinusitis   [place] [facility] [place], Netta Neat, DO       Future Appointments             In 3 months Althea Charon, Netta Neat, DO [place] [facility], [facility]

## 2024-01-06 ENCOUNTER — Encounter: Payer: Self-pay | Admitting: Family Medicine

## 2024-01-20 ENCOUNTER — Telehealth: Payer: Self-pay

## 2024-01-20 NOTE — Telephone Encounter (Signed)
 Reschedule appointment

## 2024-01-27 ENCOUNTER — Encounter: Payer: Self-pay | Admitting: Family Medicine

## 2024-01-28 ENCOUNTER — Telehealth (INDEPENDENT_AMBULATORY_CARE_PROVIDER_SITE_OTHER): Admitting: Family Medicine

## 2024-01-28 ENCOUNTER — Encounter: Payer: Self-pay | Admitting: Family Medicine

## 2024-01-28 DIAGNOSIS — H1033 Unspecified acute conjunctivitis, bilateral: Secondary | ICD-10-CM

## 2024-01-28 MED ORDER — OLOPATADINE HCL 0.1 % OP SOLN
1.0000 [drp] | Freq: Two times a day (BID) | OPHTHALMIC | 12 refills | Status: AC
Start: 2024-01-28 — End: ?

## 2024-01-28 MED ORDER — OFLOXACIN 0.3 % OP SOLN
1.0000 [drp] | Freq: Four times a day (QID) | OPHTHALMIC | 0 refills | Status: AC
Start: 2024-01-28 — End: ?

## 2024-01-28 NOTE — Patient Instructions (Addendum)
 Thank you for coming to the office today.  Restart Ofloxacin  antibiotic eye drops, with new rx. Old order may have expired less effective.  Also start anti histamine rx eye drops for allergy symptoms as well  If not successful, recommend follow with Eye Doctor.  Please schedule a Follow-up Appointment to: Return if symptoms worsen or fail to improve.  If you have any other questions or concerns, please feel free to call the office or send a message through MyChart. You may also schedule an earlier appointment if necessary.  Additionally, you may be receiving a survey about your experience at our office within a few days to 1 week by e-mail or mail. We value your feedback.  Domingo Friend, DO [facility], [place]

## 2024-01-28 NOTE — Progress Notes (Signed)
 Subjective:    Patient ID: Aaron Schwartz, male    DOB: 01-25-1981, 43 y.o.   MRN: [id number]  Aaron Schwartz is a 43 y.o. male presenting on 01/28/2024 for Conjunctivitis  Patient presents for a same day appointment.  Virtual / Telehealth Encounter - Video Visit via MyChart The purpose of this virtual visit is to provide medical care while limiting exposure to the novel coronavirus (COVID19) for both patient and office staff.  Consent was obtained for remote visit:  Yes.   Answered questions that patient had about telehealth interaction:  Yes.   I discussed the limitations, risks, security and privacy concerns of performing an evaluation and management service by video/telephone. I also discussed with the patient that there may be a patient responsible charge related to this service. The patient expressed understanding and agreed to proceed.  Patient Location: Work Provider Location: [employer] (Office)  Participants in virtual visit: - Patient: Aaron Schwartz - CMA: Nolberto Batty CMA - Provider: Dr Romeo Co   HPI  Discussed the use of AI scribe software for clinical note transcription with the patient, who gave verbal consent to proceed.  History of Present Illness   Aaron Schwartz is a 42 year old male who presents with bilateral eye redness and drainage.  He has been experiencing redness and drainage in both eyes, with the left eye being more problematic than the right. The symptoms began yesterday morning with his eyes having drainage bilateral. He has been using leftover oxfloxacin eye drops from a previous episode, but they have not fully resolved the symptoms. Drops were originally rx 1/861 over 18 year old.  The discharge is described as yellow, and the redness and irritation persist despite using oxfloxacin and Visine. He has a history of using Polytrim  eye drops in 2021, which he recalls using four times a day for both eyes but says they were not as  effective. He has tried erythromycin  ointment too does not like this one.  He also reports concurrent allergy symptoms, including nasal congestion and a runny nose, for which he uses a nasal spray. He has not used prescription allergy eye drops before and is unsure if he has tried Patanol, an anti-allergy eye drop.  He has tried an antibiotic eye ointment in the past but prefers eye drops due to convenience at work. His current oxfloxacin eye drops are over a year old, and he has used the last of them.  No other symptoms beyond the eye issues and allergy symptoms.          10/20/2023   11:10 AM 04/29/2023   11:21 AM 10/14/2022   10:50 PM  Depression screen PHQ 2/9  Decreased Interest 0 0 2  Down, Depressed, Hopeless 0 0 2  PHQ - 2 Score 0 0 4  Altered sleeping 1 1 3   Tired, decreased energy 1 1 1   Change in appetite 1 1 0  Feeling bad or failure about yourself  0 0 1  Trouble concentrating 0 0   Moving slowly or fidgety/restless 0 0 2  Suicidal thoughts 0 0 0  PHQ-9 Score 3 3 11   Difficult doing work/chores Not difficult at all Not difficult at all Somewhat difficult       10/20/2023   11:10 AM 04/29/2023   11:21 AM 10/14/2022   10:52 PM 03/01/2022    3:50 PM  GAD 7 : Generalized Anxiety Score  Nervous, Anxious, on Edge 1 1 3 3   Control/stop worrying 1 1  3 2  Worry too much - different things 1 1 2 2   Trouble relaxing 1 1 3 3   Restless 1 1 2 2   Easily annoyed or irritable 1 2 3 3   Afraid - awful might happen 0 1 2 2   Total GAD 7 Score 6 8 18 17   Anxiety Difficulty Somewhat difficult  Somewhat difficult Somewhat difficult    Social History   Tobacco Use   Smoking status: Never    Passive exposure: Never   Smokeless tobacco: Never  Vaping Use   Vaping status: Never Used  Substance Use Topics   Alcohol use: Yes    Comment: occasionally    Drug use: No    Review of Systems Per HPI unless specifically indicated above     Objective:    There were no vitals taken  for this visit.  Wt Readings from Last 3 Encounters:  10/20/23 298 lb (135.2 kg)  05/07/23 300 lb (136.1 kg)  04/29/23 300 lb (136.1 kg)     Physical Exam  Note examination was completely remotely via video observation objective data only  Gen - well-appearing, no acute distress or apparent pain, comfortable HEENT - eyes with some redness conjunctivitis clear discharge Heart/Lungs - cannot examine virtually - observed no evidence of coughing or labored breathing. Abd - cannot examine virtually  Skin - face visible today- no rash Neuro - awake, alert, oriented Psych - not anxious appearing   Results for orders placed or performed in visit on 10/20/23  POCT HgB A1C   Collection Time: 10/20/23 11:15 AM  Result Value Ref Range   Hemoglobin A1C 8.4 (A) 4.0 - 5.6 %   HbA1c POC (<> result, manual entry)     HbA1c, POC (prediabetic range)     HbA1c, POC (controlled diabetic range)        Assessment & Plan:   Problem List Items Addressed This Visit   None Visit Diagnoses       Acute conjunctivitis of both eyes, unspecified acute conjunctivitis type    -  Primary   Relevant Medications   olopatadine  (PATANOL) 0.1 % ophthalmic solution   ofloxacin  (OCUFLOX ) 0.3 % ophthalmic solution        Bilateral conjunctivitis Acute bilateral conjunctivitis with more severe symptoms in the left eye. Differential includes bacterial and allergy-related conjunctivitis. Likely old ofloxacin  drops were >59 year old maybe less effective only short duration.  Continued ofloxacin  due to previous efficacy and preference, added anti-allergy drops for potential allergic component. - Prescribe new order ofloxacin  eye drops, both eyes, four times a day. - Prescribe anti-allergy eye drops (Patanol), both eyes, two times a day. - Advise follow-up with already established eye doctors if symptoms do not improve.  Allergic rhinitis Chronic allergic rhinitis managed with nasal spray.         No orders  of the defined types were placed in this encounter.   Meds ordered this encounter  Medications   olopatadine  (PATANOL) 0.1 % ophthalmic solution    Sig: Place 1 drop into both eyes 2 (two) times daily. For allergies, may use seasonal or long term.    Dispense:  5 mL    Refill:  12   ofloxacin  (OCUFLOX ) 0.3 % ophthalmic solution    Sig: Place 1 drop into both eyes 4 (four) times daily.    Dispense:  5 mL    Refill:  0    Follow up plan: Return if symptoms worsen or fail to improve.  Patient verbalizes understanding with the above medical recommendations including the limitation of remote medical advice.  Specific follow-up and call-back criteria were given for patient to follow-up or seek medical care more urgently if needed.  Total duration of direct patient care provided via video conference: 7 minutes   Domingo Friend, DO [facility] 01/28/2024, 12:03 PM

## 2024-02-16 ENCOUNTER — Other Ambulatory Visit: Payer: Self-pay | Admitting: Family Medicine

## 2024-02-16 DIAGNOSIS — M1A09X0 Idiopathic chronic gout, multiple sites, without tophus (tophi): Secondary | ICD-10-CM

## 2024-02-18 NOTE — Telephone Encounter (Signed)
 Requested Prescriptions  Pending Prescriptions Disp Refills   allopurinol  (ZYLOPRIM ) 100 MG tablet [Pharmacy Med Name: ALLOPURINOL  100 MG TABLET] 180 tablet 0    Sig: TAKE 2 TABLETS BY MOUTH EVERY DAY     Endocrinology:  Gout Agents - allopurinol  Failed - 02/18/2024  8:44 AM      Failed - Cr in normal range and within 360 days    Creat  Date Value Ref Range Status  04/29/2023 1.46 (H) 0.60 - 1.29 mg/dL Final   Creatinine, Urine  Date Value Ref Range Status  10/14/2022 78 20 - 320 mg/dL Final         Failed - Valid encounter within last 12 months    Recent Outpatient Visits           3 weeks ago Acute conjunctivitis of both eyes, unspecified acute conjunctivitis type   [place] [facility] Raina Bunting, DO   2 months ago Acute non-recurrent frontal sinusitis   [place] [facility] [place], Kayleen Party, DO       Future Appointments             In 2 months Romeo Co, Kayleen Party, DO [place]            Passed - Uric Acid in normal range and within 360 days    Uric Acid, Serum  Date Value Ref Range Status  04/29/2023 5.3 4.0 - 8.0 mg/dL Final    Comment:    Therapeutic target for gout patients: <6.0 mg/dL .    Uric Acid  Date Value Ref Range Status  01/07/2022 6.0 3.8 - 8.4 mg/dL Final    Comment:               Therapeutic target for gout patients: <6.0         Passed - CBC within normal limits and completed in the last 12 months    WBC  Date Value Ref Range Status  04/29/2023 4.9 3.8 - 10.8 Thousand/uL Final   RBC  Date Value Ref Range Status  04/29/2023 5.94 (H) 4.20 - 5.80 Million/uL Final   Hemoglobin  Date Value Ref Range Status  04/29/2023 17.6 (H) 13.2 - 17.1 g/dL Final  82/95/6213 08.6 (H) 13.0 - 17.7 g/dL Final   HCT  Date Value Ref Range Status  04/29/2023 54.6 (H) 38.5 - 50.0 % Final   Hematocrit  Date Value Ref Range Status  11/27/2022 54.8 (H)  37.5 - 51.0 % Final   MCHC  Date Value Ref Range Status  04/29/2023 32.2 32.0 - 36.0 g/dL Final   [facility]  Date Value Ref Range Status  04/29/2023 29.6 27.0 - 33.0 pg Final   MCV  Date Value Ref Range Status  04/29/2023 91.9 80.0 - 100.0 fL Final  09/19/2020 89 79 - 97 fL Final   No results found for: "PLTCOUNTKUC", "LABPLAT", "POCPLA" RDW  Date Value Ref Range Status  04/29/2023 12.6 11.0 - 15.0 % Final  09/19/2020 13.3 11.6 - 15.4 % Final

## 2024-03-13 NOTE — Patient Instructions (Signed)

## 2024-03-17 ENCOUNTER — Ambulatory Visit (INDEPENDENT_AMBULATORY_CARE_PROVIDER_SITE_OTHER): Payer: Self-pay | Admitting: Nurse Practitioner

## 2024-03-17 ENCOUNTER — Encounter: Payer: Self-pay | Admitting: Nurse Practitioner

## 2024-03-17 VITALS — BP 123/82 | HR 76 | Temp 98.1°F | Resp 16 | Ht 75.0 in | Wt 297.6 lb

## 2024-03-17 DIAGNOSIS — Z0289 Encounter for other administrative examinations: Secondary | ICD-10-CM

## 2024-03-17 NOTE — Progress Notes (Signed)
 BP 123/82 (BP Location: Left Arm, Patient Position: Sitting, Cuff Size: Large)   Pulse 76   Temp 98.1 F (36.7 C)   Resp 16   Ht 6' 3 (1.905 m)   Wt 297 lb 9.6 oz (135 kg)   SpO2 98%   BMI 37.20 kg/m    Subjective:    Patient ID: Aaron Schwartz, male    DOB: 10-Aug-1981, 43 y.o.   MRN: [id number]  HPI: Aaron Schwartz is a 43 y.o. male presenting on 03/17/2024 for DOT Physical.  Previous DOT physical was one year ago.  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 Diabetic on oral only medications. Recent A1c 8.4%. No neuropathy.  Past Medical History:  Past Medical History:  Diagnosis Date   Allergy    Asthma    Depression    Hypertension     Surgical History:  Past Surgical History:  Procedure Laterality Date   none      Medications:  Current Outpatient Medications on File Prior to Visit  Medication Sig   albuterol  (VENTOLIN  HFA) 108 (90 Base) MCG/ACT inhaler TAKE 2 PUFFS BY MOUTH EVERY 6 HOURS AS NEEDED FOR WHEEZE OR SHORTNESS OF BREATH   allopurinol  (ZYLOPRIM ) 100 MG tablet TAKE 2 TABLETS BY MOUTH EVERY DAY   amoxicillin -clavulanate (AUGMENTIN ) 875-125 MG tablet Take 1 tablet by mouth 2 (two) times daily.   atorvastatin  (LIPITOR) 10 MG tablet TAKE 1 TABLET BY MOUTH EVERYDAY AT BEDTIME   buPROPion  (WELLBUTRIN  XL) 300 MG 24 hr tablet Take 1 tablet (300 mg total) by mouth daily.   busPIRone  (BUSPAR ) 15 MG tablet TAKE 1 TABLET BY MOUTH TWICE A DAY   clonazePAM  (KLONOPIN ) 0.5 MG tablet TAKE 1 TABLET BY MOUTH 2 TIMES DAILY AS NEEDED FOR ANXIETY.   FARXIGA  10 MG TABS tablet Take 1 tablet (10 mg total) by mouth daily before breakfast.   fluticasone  (FLONASE ) 50 MCG/ACT nasal spray SPRAY 2 SPRAYS INTO EACH NOSTRIL EVERY DAY   ipratropium (ATROVENT ) 0.03 % nasal spray Place 2 sprays into both nostrils every 12 (twelve) hours.   JANUVIA  100 MG tablet Take 1 tablet (100 mg total) by mouth daily.   levothyroxine (SYNTHROID) 137 MCG tablet Take 274 mcg by mouth  daily before breakfast. Per Endocrinology   lisinopril  (ZESTRIL ) 20 MG tablet TAKE 1 TABLET BY MOUTH EVERY DAY   NEEDLE, DISP, 18 G (BD DISP NEEDLES) 18G X 1-1/2 MISC 1 mg by Does not apply route every 14 (fourteen) days.   NEEDLE, DISP, 21 G (BD DISP NEEDLES) 21G X 1-1/2 MISC 1 mg by Does not apply route every 14 (fourteen) days.   ofloxacin  (OCUFLOX ) 0.3 % ophthalmic solution Place 1 drop into both eyes 4 (four) times daily.   olopatadine  (PATANOL) 0.1 % ophthalmic solution Place 1 drop into both eyes 2 (two) times daily. For allergies, may use seasonal or long term.   sildenafil  (REVATIO ) 20 MG tablet Take 1-5 pills about 30 min prior to sex. Start with 1 and increase as needed.   Syringe, Disposable, (2-3CC SYRINGE) 3 ML MISC 1 mg by Does not apply route every 14 (fourteen) days.   TALTZ 80 MG/ML SOAJ Inject into the skin.   testosterone  cypionate (DEPOTESTOSTERONE CYPIONATE) 200 MG/ML injection INJECT 0.5ML INTRAMUSCULARLY EVERY 7 DAYS (Patient not taking: Reported on 10/20/2023)   triamcinolone  (KENALOG ) 0.025 % cream Apply topically daily as needed.   Vitamin D , Ergocalciferol , (DRISDOL) 1.25 MG (50000 UNIT) CAPS capsule Take 50,000 Units by mouth once a  week.   No current facility-administered medications on file prior to visit.    Allergies:  Allergies  Allergen Reactions   Gramineae Pollens    Mixed Grasses     Social History:  Social History   Socioeconomic History   Marital status: Married    Spouse name: Not on file   Number of children: Not on file   Years of education: Not on file   Highest education level: Not on file  Occupational History   Occupation: Football Coach    Comment: [employer]  Tobacco Use   Smoking status: Never    Passive exposure: Never   Smokeless tobacco: Never  Vaping Use   Vaping status: Never Used  Substance and Sexual Activity   Alcohol use: Yes    Comment: occasionally    Drug use: No   Sexual activity: Not on file  Other Topics  Concern   Not on file  Social History Narrative   Not on file   Social Drivers of Health   Financial Resource Strain: Patient Declined (01/28/2024)   Overall Financial Resource Strain (CARDIA)    Difficulty of Paying Living Expenses: Patient declined  Food Insecurity: Patient Declined (01/28/2024)   Hunger Vital Sign    Worried About Running Out of Food in the Last Year: Patient declined    Ran Out of Food in the Last Year: Patient declined  Transportation Needs: Patient Declined (01/28/2024)   PRAPARE - Administrator, Civil Service (Medical): Patient declined    Lack of Transportation (Non-Medical): Patient declined  Physical Activity: Unknown (01/28/2024)   Exercise Vital Sign    Days of Exercise per Week: Patient declined    Minutes of Exercise per Session: Not on file  Stress: Stress Concern Present (01/28/2024)   [employer] of Occupational Health - Occupational Stress Questionnaire    Feeling of Stress : To some extent  Social Connections: Unknown (01/28/2024)   Social Connection and Isolation Panel    Frequency of Communication with Friends and Family: Patient declined    Frequency of Social Gatherings with Friends and Family: Patient declined    Attends Religious Services: Patient declined    Database administrator or Organizations: Yes    Attends Engineer, structural: Patient declined    Marital Status: Patient declined  Catering manager Violence: Not on file   Social History   Tobacco Use  Smoking Status Never   Passive exposure: Never  Smokeless Tobacco Never   Social History   Substance and Sexual Activity  Alcohol Use Yes   Comment: occasionally     Family History:  Family History  Problem Relation Age of Onset   Hypertension Mother    Thyroid cancer Mother    Healthy Father    Healthy Brother     Past medical history, surgical history, medications, allergies, family history and social history reviewed with patient today and  changes made to appropriate areas of the chart.   ROS All other ROS negative except what is listed above and in the HPI.      Objective:    BP 123/82 (BP Location: Left Arm, Patient Position: Sitting, Cuff Size: Large)   Pulse 76   Temp 98.1 F (36.7 C)   Resp 16   Ht 6' 3 (1.905 m)   Wt 297 lb 9.6 oz (135 kg)   SpO2 98%   BMI 37.20 kg/m   Wt Readings from Last 3 Encounters:  03/17/24 297 lb 9.6 oz (135  kg)  10/20/23 298 lb (135.2 kg)  05/07/23 300 lb (136.1 kg)    Physical Exam Vitals and nursing note reviewed.  Constitutional:      General: He is awake. He is not in acute distress.    Appearance: He is well-developed and well-groomed. He is obese. He is not ill-appearing or toxic-appearing.  HENT:     Head: Normocephalic and atraumatic.     Right Ear: Hearing, tympanic membrane, ear canal and external ear normal. No drainage.     Left Ear: Hearing, tympanic membrane, ear canal and external ear normal. No drainage.     Nose: Nose normal.     Mouth/Throat:     Pharynx: Uvula midline.   Eyes:     General: Lids are normal.        Right eye: No discharge.        Left eye: No discharge.     Extraocular Movements: Extraocular movements intact.     Conjunctiva/sclera: Conjunctivae normal.     Pupils: Pupils are equal, round, and reactive to light.     Visual Fields: Right eye visual fields normal and left eye visual fields normal.     Comments: 70 degrees both eyes.  Neck:     Thyroid: No thyromegaly.     Vascular: No carotid bruit or JVD.     Trachea: Trachea normal.   Cardiovascular:     Rate and Rhythm: Normal rate and regular rhythm.     Heart sounds: Normal heart sounds, S1 normal and S2 normal. No murmur heard.    No gallop.  Pulmonary:     Effort: Pulmonary effort is normal. No accessory muscle usage or respiratory distress.     Breath sounds: Normal breath sounds.  Abdominal:     General: Bowel sounds are normal.     Palpations: Abdomen is soft. There is  no hepatomegaly or splenomegaly.     Tenderness: There is no abdominal tenderness.   Musculoskeletal:        General: Normal range of motion.     Cervical back: Normal range of motion and neck supple.     Right lower leg: No edema.     Left lower leg: No edema.  Lymphadenopathy:     Head:     Right side of head: No submental, submandibular, tonsillar, preauricular or posterior auricular adenopathy.     Left side of head: No submental, submandibular, tonsillar, preauricular or posterior auricular adenopathy.     Cervical: No cervical adenopathy.   Skin:    General: Skin is warm and dry.     Capillary Refill: Capillary refill takes less than 2 seconds.     Findings: No rash.   Neurological:     Mental Status: He is alert and oriented to person, place, and time.     Cranial Nerves: Cranial nerves 2-12 are intact.     Sensory: Sensation is intact.     Motor: Motor function is intact.     Coordination: Coordination is intact.     Gait: Gait is intact.     Deep Tendon Reflexes: Reflexes are normal and symmetric.     Reflex Scores:      Brachioradialis reflexes are 2+ on the right side and 2+ on the left side.      Patellar reflexes are 2+ on the right side and 2+ on the left side.  Psychiatric:        Attention and Perception: Attention normal.  Mood and Affect: Mood normal.        Speech: Speech normal.        Behavior: Behavior normal. Behavior is cooperative.        Thought Content: Thought content normal.        Cognition and Memory: Cognition normal.    Hearing Screening   500Hz  1000Hz  2000Hz  4000Hz   Right ear 20 20 20 20   Left ear 20 20 20 20    Vision Screening   Right eye Left eye Both eyes  Without correction 20/20 20/20 20/20   With correction        Results for orders placed or performed in visit on 10/20/23  POCT HgB A1C   Collection Time: 10/20/23 11:15 AM  Result Value Ref Range   Hemoglobin A1C 8.4 (A) 4.0 - 5.6 %   HbA1c POC (<> result, manual  entry)     HbA1c, POC (prediabetic range)     HbA1c, POC (controlled diabetic range)        Assessment & Plan:   Problem List Items Addressed This Visit       Other   Encounter for examination required by Department of Transportation (DOT) - Primary   DOT Certificate provided x 1 years   Hearing test: Pass to both ears at 20' Vision: 20/20 R, 20/20 L, 20/20 Both Urine 1.010, Neg Protein,4+ Glucose (takes an SGLT2), Neg Hematuria Recent A1c 8.4% and no diagnosis of neuropathy present, discussed with him if neuropathy presents he will need to see neurologist before passing DOT.           Follow up plan: Return if symptoms worsen or fail to improve.   PATIENT COUNSELING:    Advised to avoid cigarette smoking.  I discussed with the patient that most people either abstain from alcohol or drink within safe limits (<=14/week and <=4 drinks/occasion for males, <=7/weeks and <= 3 drinks/occasion for females) and that the risk for alcohol disorders and other health effects rises proportionally with the number of drinks per week and how often a drinker exceeds daily limits.  Discussed cessation/primary prevention of drug use and availability of treatment for abuse.   Diet: Encouraged to adjust caloric intake to maintain  or achieve ideal body weight, to reduce intake of dietary saturated fat and total fat, to limit sodium intake by avoiding high sodium foods and not adding table salt, and to maintain adequate dietary potassium and calcium  preferably from fresh fruits, vegetables, and low-fat dairy products.    Stressed the importance of regular exercise  Injury prevention: Discussed safety belts, safety helmets, smoke detector, smoking near bedding or upholstery.   Dental health: Discussed importance of regular tooth brushing, flossing, and dental visits.    NEXT PREVENTATIVE PHYSICAL DUE IN 1 YEAR. Return if symptoms worsen or fail to improve.

## 2024-03-17 NOTE — Assessment & Plan Note (Signed)
 DOT Certificate provided x 1 years   Hearing test: Pass to both ears at 20' Vision: 20/20 R, 20/20 L, 20/20 Both Urine 1.010, Neg Protein,4+ Glucose (takes an SGLT2), Neg Hematuria Recent A1c 8.4% and no diagnosis of neuropathy present, discussed with him if neuropathy presents he will need to see neurologist before passing DOT.

## 2024-04-04 ENCOUNTER — Other Ambulatory Visit: Payer: Self-pay | Admitting: Family Medicine

## 2024-04-04 DIAGNOSIS — E1169 Type 2 diabetes mellitus with other specified complication: Secondary | ICD-10-CM

## 2024-04-05 ENCOUNTER — Other Ambulatory Visit: Payer: Self-pay | Admitting: Family Medicine

## 2024-04-05 ENCOUNTER — Telehealth: Payer: Self-pay

## 2024-04-05 DIAGNOSIS — J302 Other seasonal allergic rhinitis: Secondary | ICD-10-CM

## 2024-04-05 DIAGNOSIS — J209 Acute bronchitis, unspecified: Secondary | ICD-10-CM

## 2024-04-05 NOTE — Telephone Encounter (Signed)
 Called and notified patient of Jolene's message.

## 2024-04-05 NOTE — Telephone Encounter (Signed)
 Copied from CRM [id number]. Topic: General - Other >> Apr 05, 2024  8:39 AM Edsel HERO wrote: Patient called to see if provider could send his DOT medical certificate to the [facility]. Please advise.

## 2024-04-05 NOTE — Telephone Encounter (Signed)
 Was this patient's DOT information sent in?

## 2024-04-06 NOTE — Telephone Encounter (Signed)
 Requested Prescriptions  Pending Prescriptions Disp Refills   atorvastatin  (LIPITOR) 10 MG tablet [Pharmacy Med Name: ATORVASTATIN  10 MG TABLET] 90 tablet 0    Sig: TAKE 1 TABLET BY MOUTH EVERYDAY AT BEDTIME     Cardiovascular:  Antilipid - Statins Failed - 04/06/2024  3:04 PM      Failed - Lipid Panel in normal range within the last 12 months    Cholesterol, Total  Date Value Ref Range Status  01/07/2022 278 (H) 100 - 199 mg/dL Final   Cholesterol  Date Value Ref Range Status  04/29/2023 172 <200 mg/dL Final   LDL Cholesterol (Calc)  Date Value Ref Range Status  04/29/2023 107 (H) mg/dL (calc) Final    Comment:    Reference range: <100 . Desirable range <100 mg/dL for primary prevention;   <70 mg/dL for patients with CHD or diabetic patients  with > or = 2 CHD risk factors. SABRA LDL-C is now calculated using the Martin-Hopkins  calculation, which is a validated novel method providing  better accuracy than the Friedewald equation in the  estimation of LDL-C.  Gladis APPLETHWAITE et al. SANDREA. 7986;689(80): 2061-2068  (http://education.[facility].com/faq/FAQ164)    HDL  Date Value Ref Range Status  04/29/2023 44 > OR = 40 mg/dL Final  95/89/7976 49 >60 mg/dL Final   Triglycerides  Date Value Ref Range Status  04/29/2023 118 <150 mg/dL Final         Passed - Patient is not pregnant      Passed - Valid encounter within last 12 months    Recent Outpatient Visits           2 weeks ago Encounter for examination required by Department of Transportation (DOT)   [place] [place], [place] T, NP   2 months ago Acute conjunctivitis of both eyes, unspecified acute conjunctivitis type   [facility], Marsa PARAS, DO   3 months ago Acute non-recurrent frontal sinusitis   [place] [facility] [place], Marsa PARAS, [place]

## 2024-04-06 NOTE — Telephone Encounter (Signed)
 Requested Prescriptions  Pending Prescriptions Disp Refills   fluticasone  (FLONASE ) 50 MCG/ACT nasal spray [Pharmacy Med Name: FLUTICASONE  PROP 50 MCG SPRAY] 48 mL 0    Sig: SPRAY 2 SPRAYS INTO EACH NOSTRIL EVERY DAY     Ear, Nose, and Throat: Nasal Preparations - Corticosteroids Passed - 04/06/2024  3:52 PM      Passed - Valid encounter within last 12 months    Recent Outpatient Visits           2 weeks ago Encounter for examination required by Department of Transportation (DOT)   [facility] [place], [place] T, NP   2 months ago Acute conjunctivitis of both eyes, unspecified acute conjunctivitis type   [facility] [place], Marsa PARAS, DO   3 months ago Acute non-recurrent frontal sinusitis   [place] [facility] [facility] [place], Marsa PARAS, [place]

## 2024-04-16 ENCOUNTER — Other Ambulatory Visit: Payer: Self-pay

## 2024-04-16 DIAGNOSIS — Z125 Encounter for screening for malignant neoplasm of prostate: Secondary | ICD-10-CM

## 2024-04-16 DIAGNOSIS — M1A09X0 Idiopathic chronic gout, multiple sites, without tophus (tophi): Secondary | ICD-10-CM

## 2024-04-16 DIAGNOSIS — E1169 Type 2 diabetes mellitus with other specified complication: Secondary | ICD-10-CM

## 2024-04-16 DIAGNOSIS — Z0000 Encounter for general adult medical examination without abnormal findings: Secondary | ICD-10-CM

## 2024-04-16 DIAGNOSIS — I10 Essential (primary) hypertension: Secondary | ICD-10-CM

## 2024-04-16 DIAGNOSIS — E3122 Multiple endocrine neoplasia [MEN] type IIA: Secondary | ICD-10-CM

## 2024-04-17 LAB — COMPLETE METABOLIC PANEL WITHOUT GFR
AG Ratio: 1.5 (calc) (ref 1.0–2.5)
ALT: 16 U/L (ref 9–46)
AST: 12 U/L (ref 10–40)
Albumin: 4.8 g/dL (ref 3.6–5.1)
Alkaline phosphatase (APISO): 83 U/L (ref 36–130)
BUN/Creatinine Ratio: 15 (calc) (ref 6–22)
BUN: 22 mg/dL (ref 7–25)
CO2: 26 mmol/L (ref 20–32)
Calcium: 9.4 mg/dL (ref 8.6–10.3)
Chloride: 95 mmol/L — ABNORMAL LOW (ref 98–110)
Creat: 1.44 mg/dL — ABNORMAL HIGH (ref 0.60–1.29)
Globulin: 3.1 g/dL (ref 1.9–3.7)
Glucose, Bld: 216 mg/dL — ABNORMAL HIGH (ref 65–99)
Potassium: 4.9 mmol/L (ref 3.5–5.3)
Sodium: 133 mmol/L — ABNORMAL LOW (ref 135–146)
Total Bilirubin: 0.6 mg/dL (ref 0.2–1.2)
Total Protein: 7.9 g/dL (ref 6.1–8.1)

## 2024-04-17 LAB — MICROALBUMIN / CREATININE URINE RATIO
Creatinine, Urine: 107 mg/dL (ref 20–320)
Microalb Creat Ratio: 35 mg/g{creat} — ABNORMAL HIGH (ref <30)
Microalb, Ur: 3.7 mg/dL

## 2024-04-17 LAB — HEMOGLOBIN A1C
Hgb A1c MFr Bld: 10.9 % — ABNORMAL HIGH (ref <5.7)
Mean Plasma Glucose: 266 mg/dL
eAG (mmol/L): 14.7 mmol/L

## 2024-04-17 LAB — CBC WITH DIFFERENTIAL/PLATELET
Absolute Lymphocytes: 2195 {cells}/uL (ref 850–3900)
Absolute Monocytes: 517 {cells}/uL (ref 200–950)
Basophils Absolute: 28 {cells}/uL (ref 0–200)
Basophils Relative: 0.5 %
Eosinophils Absolute: 132 {cells}/uL (ref 15–500)
Eosinophils Relative: 2.4 %
HCT: 52.7 % — ABNORMAL HIGH (ref 38.5–50.0)
Hemoglobin: 16.8 g/dL (ref 13.2–17.1)
MCH: 29.3 pg (ref 27.0–33.0)
MCHC: 31.9 g/dL — ABNORMAL LOW (ref 32.0–36.0)
MCV: 91.8 fL (ref 80.0–100.0)
MPV: 9.7 fL (ref 7.5–12.5)
Monocytes Relative: 9.4 %
Neutro Abs: 2629 {cells}/uL (ref 1500–7800)
Neutrophils Relative %: 47.8 %
Platelets: 301 Thousand/uL (ref 140–400)
RBC: 5.74 Million/uL (ref 4.20–5.80)
RDW: 13.3 % (ref 11.0–15.0)
Total Lymphocyte: 39.9 %
WBC: 5.5 Thousand/uL (ref 3.8–10.8)

## 2024-04-17 LAB — URIC ACID: Uric Acid, Serum: 5.3 mg/dL (ref 4.0–8.0)

## 2024-04-17 LAB — LIPID PANEL
Cholesterol: 203 mg/dL — ABNORMAL HIGH (ref <200)
HDL: 46 mg/dL (ref >=40)
LDL Cholesterol (Calc): 131 mg/dL — ABNORMAL HIGH
Non-HDL Cholesterol (Calc): 157 mg/dL — ABNORMAL HIGH (ref <130)
Total CHOL/HDL Ratio: 4.4 (calc) (ref <5.0)
Triglycerides: 148 mg/dL (ref <150)

## 2024-04-17 LAB — TSH: TSH: 1.27 m[IU]/L (ref 0.40–4.50)

## 2024-04-17 LAB — PSA: PSA: 0.57 ng/mL (ref <=4.00)

## 2024-04-17 LAB — T4, FREE: Free T4: 1.8 ng/dL (ref 0.8–1.8)

## 2024-04-23 ENCOUNTER — Ambulatory Visit: Payer: Self-pay | Admitting: Family Medicine

## 2024-04-27 ENCOUNTER — Ambulatory Visit (INDEPENDENT_AMBULATORY_CARE_PROVIDER_SITE_OTHER): Admitting: Family Medicine

## 2024-04-27 ENCOUNTER — Encounter: Payer: Self-pay | Admitting: Family Medicine

## 2024-04-27 VITALS — BP 130/88 | HR 74 | Ht 75.0 in | Wt 291.1 lb

## 2024-04-27 DIAGNOSIS — Z7984 Long term (current) use of oral hypoglycemic drugs: Secondary | ICD-10-CM

## 2024-04-27 DIAGNOSIS — E1169 Type 2 diabetes mellitus with other specified complication: Secondary | ICD-10-CM | POA: Diagnosis not present

## 2024-04-27 DIAGNOSIS — Z1211 Encounter for screening for malignant neoplasm of colon: Secondary | ICD-10-CM | POA: Diagnosis not present

## 2024-04-27 DIAGNOSIS — F331 Major depressive disorder, recurrent, moderate: Secondary | ICD-10-CM

## 2024-04-27 DIAGNOSIS — Z0000 Encounter for general adult medical examination without abnormal findings: Secondary | ICD-10-CM

## 2024-04-27 DIAGNOSIS — J302 Other seasonal allergic rhinitis: Secondary | ICD-10-CM

## 2024-04-27 DIAGNOSIS — E785 Hyperlipidemia, unspecified: Secondary | ICD-10-CM

## 2024-04-27 DIAGNOSIS — M1A09X0 Idiopathic chronic gout, multiple sites, without tophus (tophi): Secondary | ICD-10-CM | POA: Diagnosis not present

## 2024-04-27 DIAGNOSIS — J4520 Mild intermittent asthma, uncomplicated: Secondary | ICD-10-CM

## 2024-04-27 DIAGNOSIS — J209 Acute bronchitis, unspecified: Secondary | ICD-10-CM

## 2024-04-27 DIAGNOSIS — F419 Anxiety disorder, unspecified: Secondary | ICD-10-CM

## 2024-04-27 MED ORDER — BUSPIRONE HCL 15 MG PO TABS: 15.0000 mg | ORAL_TABLET | Freq: Two times a day (BID) | ORAL | 3 refills | Status: AC

## 2024-04-27 MED ORDER — BUPROPION HCL ER (XL) 300 MG PO TB24: 300.0000 mg | ORAL_TABLET | Freq: Every day | ORAL | 3 refills | Status: AC

## 2024-04-27 MED ORDER — ATORVASTATIN CALCIUM 10 MG PO TABS: 10.0000 mg | ORAL_TABLET | Freq: Every day | ORAL | 3 refills | Status: AC

## 2024-04-27 MED ORDER — ALLOPURINOL 100 MG PO TABS: 200.0000 mg | ORAL_TABLET | Freq: Every day | ORAL | 3 refills | Status: AC

## 2024-04-27 MED ORDER — TRESIBA FLEXTOUCH 100 UNIT/ML ~~LOC~~ SOPN: 10.0000 [IU] | PEN_INJECTOR | Freq: Every day | SUBCUTANEOUS | Status: AC

## 2024-04-27 MED ORDER — ALBUTEROL SULFATE HFA 108 (90 BASE) MCG/ACT IN AERS: INHALATION_SPRAY | RESPIRATORY_TRACT | 5 refills | Status: AC

## 2024-04-27 NOTE — Progress Notes (Signed)
 Subjective:    Patient ID: Aaron Schwartz, male    DOB: 05-15-1981, 43 y.o.   MRN: [id number]  Aaron Schwartz is a 43 y.o. male presenting on 04/27/2024 for Annual Exam   HPI  Discussed the use of AI scribe software for clinical note transcription with the patient, who gave verbal consent to proceed.  History of Present Illness   Aaron Schwartz is a 43 year old male who presents for an annual physical exam.  Hyperglycemia and fatigue - Low energy levels and sluggishness attributed to elevated blood glucose - Recent hemoglobin A1c of 10.9, previously in the 8-9 range - Currently taking Januvia  and Farxiga   Anxiety and sleep disturbance - Anxiety characterized by 'stressing about everything' - Difficulty sleeping due to tossing and turning - Currently taking Buspar  15 mg twice daily for anxiety - Clonazepam  available for use as needed, minimal recent use - Wellbutrin  taken for mood, due for new order  Hyperlipidemia - Recent cholesterol increased from 107 to 131 - Atorvastatin  10 mg daily - Diet includes eggs and cheese, believed to contribute to cholesterol elevation  Testosterone  deficiency and polycythemia - History of low testosterone  managed by urologist - Elevated hemoglobin levels have limited ability to take testosterone  supplementation  Respiratory symptoms and allergies - Uses albuterol  inhalers for respiratory symptoms, especially with grass exposure - Needs new albuterol  inhaler prescriptions for home and school - Has sufficient supply of nasal spray  Colorectal cancer screening - No known family history of colon cancer - Considering Cologuard test for colon cancer screening after turning 43       Here for Annual Physical and Lab Review    CHRONIC DM, Type 2 / Morbid Obesity BMI >36 A1c elevated to 10.9 Admits lethargy, reduced energy with hyperglycemia Meds: Farxiga  10mg  daily, Januvia  100mg  daily tolerating well, staying hydrated. 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 Last lipid panel 03/2024, LDL 131 from prior 107 - Currently taking Atorvastatin  10mg , tolerating well without side effects or myalgias   Depression recurrent Anxiety   Improved on medication but not adequately controlling. 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  Hypogonadism He remains off testosterone  replacement HRT   Health Maintenance: Decline Tdap today  Colon CA Screening, initial test Cologuard age 30+ no known fam history. Order today      04/27/2024    9:11 AM 10/20/2023   11:10 AM 04/29/2023   11:21 AM  Depression screen PHQ 2/9  Decreased Interest 0 0 0  Down, Depressed, Hopeless 0 0 0  PHQ - 2 Score 0 0 0  Altered sleeping 1 1 1   Tired, decreased energy 1 1 1   Change in appetite 0 1 1  Feeling bad or failure about yourself  0 0 0  Trouble concentrating 0 0 0  Moving slowly or fidgety/restless 0 0 0  Suicidal thoughts 0 0 0  PHQ-9 Score 2 3 3   Difficult doing work/chores Not difficult at all Not difficult at all  Not difficult at all       04/27/2024    9:11 AM 10/20/2023   11:10 AM 04/29/2023   11:21 AM 10/14/2022   10:52 PM  GAD 7 : Generalized Anxiety Score  Nervous, Anxious, on Edge 2 1 1 3   Control/stop worrying 2 1 1 3   Worry too much - different things 2 1 1 2   Trouble relaxing 2 1 1 3   Restless 1 1 1 2   Easily annoyed or irritable  2 1 2 3   Afraid - awful might happen 2 0 1 2  Total GAD 7 Score 13 6 8 18   Anxiety Difficulty Somewhat difficult Somewhat difficult  Somewhat difficult     Past Medical History:  Diagnosis Date   Allergy    Asthma    Depression    Hypertension    Past Surgical History:  Procedure Laterality Date   none     Social History   Socioeconomic History   Marital status: Married    Spouse name: Not on file   Number of children: Not on file   Years of education: Not on file   Highest education level: Not on file  Occupational History   Occupation: Football Coach    Comment: [employer]  Tobacco Use   Smoking status: Never    Passive exposure: Never   Smokeless tobacco: Never  Vaping Use   Vaping status: Never Used  Substance and Sexual Activity   Alcohol use: Yes    Comment: occasionally    Drug use: No   Sexual activity: Not on file  Other Topics Concern   Not on file  Social History Narrative   Not on file   Social Drivers of Health   Financial Resource Strain: Patient Declined (01/28/2024)   Overall Financial Resource Strain (CARDIA)    Difficulty of Paying Living Expenses: Patient declined  Food Insecurity: Patient Declined (01/28/2024)   Hunger Vital Sign    Worried About Running Out of Food in the Last Year: Patient declined    Ran Out of Food in the Last Year: Patient declined  Transportation Needs: Patient Declined (01/28/2024)   PRAPARE - Administrator, Civil Service (Medical): Patient declined    Lack of Transportation (Non-Medical): Patient declined  Physical Activity: Unknown (01/28/2024)   Exercise Vital Sign    Days of Exercise per Week: Patient declined    Minutes of Exercise per Session: Not on file  Stress: Stress Concern Present (01/28/2024)   [employer] of Occupational Health - Occupational Stress Questionnaire    Feeling of Stress : To some extent  Social Connections: Unknown (01/28/2024)   Social Connection and Isolation Panel     Frequency of Communication with Friends and Family: Patient declined    Frequency of Social Gatherings with Friends and Family: Patient declined    Attends Religious Services: Patient declined    Database administrator or Organizations: Yes    Attends Engineer, structural: Patient declined    Marital Status: Patient declined  Catering manager Violence: Not on file   Family History  Problem Relation Age of Onset   Hypertension Mother    Thyroid cancer Mother    Healthy Father    Healthy Brother    Current Outpatient Medications on File Prior to Visit  Medication Sig   clonazePAM  (KLONOPIN ) 0.5 MG tablet TAKE 1 TABLET BY MOUTH 2 TIMES DAILY AS NEEDED FOR ANXIETY.   FARXIGA  10 MG TABS tablet Take 1 tablet (10  mg total) by mouth daily before breakfast.   fluticasone  (FLONASE ) 50 MCG/ACT nasal spray SPRAY 2 SPRAYS INTO EACH NOSTRIL EVERY DAY   ipratropium (ATROVENT ) 0.03 % nasal spray Place 2 sprays into both nostrils every 12 (twelve) hours.   JANUVIA  100 MG tablet Take 1 tablet (100 mg total) by mouth daily.   levothyroxine (SYNTHROID) 137 MCG tablet Take 274 mcg by mouth daily before breakfast. Per Endocrinology   lisinopril  (ZESTRIL ) 20 MG tablet TAKE 1 TABLET BY MOUTH EVERY DAY   NEEDLE, DISP, 18 G (BD DISP NEEDLES) 18G X 1-1/2 MISC 1 mg by Does not apply route every 14 (fourteen) days.   NEEDLE, DISP, 21 G (BD DISP NEEDLES) 21G X 1-1/2 MISC 1 mg by Does not apply route every 14 (fourteen) days.   ofloxacin  (OCUFLOX ) 0.3 % ophthalmic solution Place 1 drop into both eyes 4 (four) times daily.   olopatadine  (PATANOL) 0.1 % ophthalmic solution Place 1 drop into both eyes 2 (two) times daily. For allergies, may use seasonal or long term.   sildenafil  (REVATIO ) 20 MG tablet Take 1-5 pills about 30 min prior to sex. Start with 1 and increase as needed.   Syringe, Disposable, (2-3CC SYRINGE) 3 ML MISC 1 mg by Does not apply route every 14 (fourteen) days.   triamcinolone   (KENALOG ) 0.025 % cream Apply topically daily as needed.   Vitamin D , Ergocalciferol , (DRISDOL) 1.25 MG (50000 UNIT) CAPS capsule Take 50,000 Units by mouth once a week.   No current facility-administered medications on file prior to visit.    Review of Systems  Constitutional:  Negative for activity change, appetite change, chills, diaphoresis, fatigue and fever.  HENT:  Negative for congestion and hearing loss.   Eyes:  Negative for visual disturbance.  Respiratory:  Negative for cough, chest tightness, shortness of breath and wheezing.   Cardiovascular:  Negative for chest pain, palpitations and leg swelling.  Gastrointestinal:  Negative for abdominal pain, constipation, diarrhea, nausea and vomiting.  Genitourinary:  Negative for dysuria, frequency and hematuria.  Musculoskeletal:  Negative for arthralgias and neck pain.  Skin:  Negative for rash.  Neurological:  Negative for dizziness, weakness, light-headedness, numbness and headaches.  Hematological:  Negative for adenopathy.  Psychiatric/Behavioral:  Negative for behavioral problems, dysphoric mood and sleep disturbance.    Per HPI unless specifically indicated above     Objective:    BP 130/88 (BP Location: Left Arm, Patient Position: Sitting, Cuff Size: Large)   Pulse 74   Ht 6' 3 (1.905 m)   Wt 291 lb 2 oz (132.1 kg)   SpO2 94%   BMI 36.39 kg/m   Wt Readings from Last 3 Encounters:  04/27/24 291 lb 2 oz (132.1 kg)  03/17/24 297 lb 9.6 oz (135 kg)  10/20/23 298 lb (135.2 kg)    Physical Exam Vitals and nursing note reviewed.  Constitutional:      General: He is not in acute distress.    Appearance: He is well-developed. He is not diaphoretic.     Comments: Well-appearing, comfortable, cooperative  HENT:     Head: Normocephalic and atraumatic.  Eyes:     General:        Right eye: No discharge.        Left eye: No discharge.     Conjunctiva/sclera: Conjunctivae normal.     Pupils: Pupils are equal, round,  and reactive to light.  Neck:     Thyroid: No thyromegaly.     Vascular: No carotid  bruit.  Cardiovascular:     Rate and Rhythm: Normal rate and regular rhythm.     Pulses: Normal pulses.     Heart sounds: Normal heart sounds. No murmur heard. Pulmonary:     Effort: Pulmonary effort is normal. No respiratory distress.     Breath sounds: Normal breath sounds. No wheezing or rales.  Abdominal:     General: Bowel sounds are normal. There is no distension.     Palpations: Abdomen is soft. There is no mass.     Tenderness: There is no abdominal tenderness.  Musculoskeletal:        General: No tenderness. Normal range of motion.     Cervical back: Normal range of motion and neck supple.     Right lower leg: No edema.     Left lower leg: No edema.     Comments: Upper / Lower Extremities: - Normal muscle tone, strength bilateral upper extremities 5/5, lower extremities 5/5  Lymphadenopathy:     Cervical: No cervical adenopathy.  Skin:    General: Skin is warm and dry.     Findings: No erythema or rash.  Neurological:     Mental Status: He is alert and oriented to person, place, and time.     Comments: Distal sensation intact to light touch all extremities  Psychiatric:        Mood and Affect: Mood normal.        Behavior: Behavior normal.        Thought Content: Thought content normal.     Comments: Well groomed, good eye contact, normal speech and thoughts     Results for orders placed or performed in visit on 04/16/24  T4, free   Collection Time: 04/16/24  8:24 AM  Result Value Ref Range   Free T4 1.8 0.8 - 1.8 ng/dL  Uric acid   Collection Time: 04/16/24  8:24 AM  Result Value Ref Range   Uric Acid, Serum 5.3 4.0 - 8.0 mg/dL  TSH   Collection Time: 04/16/24  8:24 AM  Result Value Ref Range   TSH 1.27 0.40 - 4.50 mIU/L  PSA   Collection Time: 04/16/24  8:24 AM  Result Value Ref Range   PSA 0.57 < OR = 4.00 ng/mL  CBC with Differential/Platelet   Collection Time:  04/16/24  8:24 AM  Result Value Ref Range   WBC 5.5 3.8 - 10.8 Thousand/uL   RBC 5.74 4.20 - 5.80 Million/uL   Hemoglobin 16.8 13.2 - 17.1 g/dL   HCT 47.2 (H) 61.4 - 49.9 %   MCV 91.8 80.0 - 100.0 fL   MCH 29.3 27.0 - 33.0 pg   MCHC 31.9 (L) 32.0 - 36.0 g/dL   RDW 86.6 88.9 - 84.9 %   Platelets 301 140 - 400 Thousand/uL   MPV 9.7 7.5 - 12.5 fL   Neutro Abs 2,629 1,500 - 7,800 cells/uL   Absolute Lymphocytes 2,195 850 - 3,900 cells/uL   Absolute Monocytes 517 200 - 950 cells/uL   Eosinophils Absolute 132 15 - 500 cells/uL   Basophils Absolute 28 0 - 200 cells/uL   Neutrophils Relative % 47.8 %   Total Lymphocyte 39.9 %   Monocytes Relative 9.4 %   Eosinophils Relative 2.4 %   Basophils Relative 0.5 %  COMPLETE METABOLIC PANEL WITH GFR   Collection Time: 04/16/24  8:24 AM  Result Value Ref Range   Glucose, Bld 216 (H) 65 - 99 mg/dL   BUN 22 7 - 25  mg/dL   Creat 8.55 (H) 9.39 - 1.29 mg/dL   BUN/Creatinine Ratio 15 6 - 22 (calc)   Sodium 133 (L) 135 - 146 mmol/L   Potassium 4.9 3.5 - 5.3 mmol/L   Chloride 95 (L) 98 - 110 mmol/L   CO2 26 20 - 32 mmol/L   Calcium  9.4 8.6 - 10.3 mg/dL   Total Protein 7.9 6.1 - 8.1 g/dL   Albumin 4.8 3.6 - 5.1 g/dL   Globulin 3.1 1.9 - 3.7 g/dL (calc)   AG Ratio 1.5 1.0 - 2.5 (calc)   Total Bilirubin 0.6 0.2 - 1.2 mg/dL   Alkaline phosphatase (APISO) 83 36 - 130 U/L   AST 12 10 - 40 U/L   ALT 16 9 - 46 U/L  Hemoglobin A1c   Collection Time: 04/16/24  8:24 AM  Result Value Ref Range   Hgb A1c MFr Bld 10.9 (H) <5.7 %   Mean Plasma Glucose 266 mg/dL   eAG (mmol/L) 85.2 mmol/L  Lipid panel   Collection Time: 04/16/24  8:24 AM  Result Value Ref Range   Cholesterol 203 (H) <200 mg/dL   HDL 46 > OR = 40 mg/dL   Triglycerides 851 <849 mg/dL   LDL Cholesterol (Calc) 131 (H) mg/dL (calc)   Total CHOL/HDL Ratio 4.4 <5.0 (calc)   Non-HDL Cholesterol (Calc) 157 (H) <130 mg/dL (calc)  Microalbumin / creatinine urine ratio   Collection Time:  04/16/24  8:37 AM  Result Value Ref Range   Creatinine, Urine 107 20 - 320 mg/dL   Microalb, Ur 3.7 mg/dL   Microalb Creat Ratio 35 (H) <30 mg/g creat      Assessment & Plan:   Problem List Items Addressed This Visit     Anxiety   Relevant Medications   buPROPion  (WELLBUTRIN  XL) 300 MG 24 hr tablet   busPIRone  (BUSPAR ) 15 MG tablet   Asthma   Relevant Medications   albuterol  (VENTOLIN  HFA) 108 (90 Base) MCG/ACT inhaler   Hyperlipidemia associated with type 2 diabetes mellitus (HCC)   Relevant Medications   atorvastatin  (LIPITOR) 10 MG tablet   TRESIBA  FLEXTOUCH 100 UNIT/ML FlexTouch Pen   Idiopathic chronic gout of multiple sites without tophus   Relevant Medications   allopurinol  (ZYLOPRIM ) 100 MG tablet   Type 2 diabetes mellitus with other specified complication (HCC)   Relevant Medications   atorvastatin  (LIPITOR) 10 MG tablet   TRESIBA  FLEXTOUCH 100 UNIT/ML FlexTouch Pen   Other Relevant Orders   AMB Referral VBCI Care Management   Other Visit Diagnoses       Annual physical exam    -  Primary     Screening for colon cancer       Relevant Orders   Cologuard     Moderate episode of recurrent major depressive disorder (HCC)       Relevant Medications   buPROPion  (WELLBUTRIN  XL) 300 MG 24 hr tablet   busPIRone  (BUSPAR ) 15 MG tablet     Seasonal allergies         Acute bronchitis, unspecified organism         Long term current use of oral hypoglycemic drug            Updated Health Maintenance information Reviewed recent lab results with patient Encouraged improvement to lifestyle with diet and exercise Goal of weight loss  Type 2 diabetes mellitus with poor glycemic control A1c at 10.9, indicating worsening poor control.  Failed Metformin  GI side effect. Unable to take GLP1  therapy due to MEN2A diagnosis, followed by Endocrinology - Continue Farxiga  10mg  daily, Januvia  100mg  daily - Initiate sample insulin Tresiba  10 units daily for basal dosing.  Hypoglycemia risk noted. - Consult with clinical pharmacist for insulin education.  New referral VBCI. Consider CGM eligibility   - Also encouraged him to coordinate with endocrinologist for management in future given difficulty in managing his Diabetes at this time, and some medication restrictions. - Monitor glucose and adjust insulin as needed.  Chronic kidney disease Significant improvement in kidney function with improved urine microalbumin testing. Continue SGLT2   Hyperlipidemia Cholesterol increased to 131. On atorvastatin  10 mg daily. Dietary modifications discussed. - Continue atorvastatin  10 mg daily. - Add atorvastatin  refills. - Encourage dietary modifications.  Asthma intermittent Increased albuterol  use, possibly due to environmental factors. - Prescribe albuterol  inhaler with refills.  Anxiety disorder Increased anxiety and sleep difficulty. Prefers non-medication management. On Buspar  and as-needed clonazepam . - Continue Buspar  15 mg twice daily. - Ensure clonazepam  available as needed.  Major depressive disorder On Wellbutrin . No specific concerns discussed. - Renew Wellbutrin  prescription.  Hypogonadism (low testosterone ) Low testosterone , unable to use replacement therapy due to high hemoglobin. Managed by urologist.  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 Prefers Cologuard for screening. - Order Cologuard for colon cancer screening. - Discuss TDAP booster if needed.       Orders Placed This Encounter  Procedures   Cologuard   AMB Referral VBCI Care Management    Referral Priority:   Routine    Referral Type:   Consultation    Referral Reason:   Care Coordination    Number of Visits Requested:   1    Meds ordered this encounter  Medications   atorvastatin  (LIPITOR) 10 MG tablet    Sig: Take 1 tablet (10 mg total) by mouth at bedtime.    Dispense:  90  tablet    Refill:  3    Add future refills   allopurinol  (ZYLOPRIM ) 100 MG tablet    Sig: Take 2 tablets (200 mg total) by mouth daily.    Dispense:  180 tablet    Refill:  3   buPROPion  (WELLBUTRIN  XL) 300 MG 24 hr tablet    Sig: Take 1 tablet (300 mg total) by mouth daily.    Dispense:  90 tablet    Refill:  3   albuterol  (VENTOLIN  HFA) 108 (90 Base) MCG/ACT inhaler    Sig: TAKE 2 PUFFS BY MOUTH EVERY 6 HOURS AS NEEDED FOR WHEEZE OR SHORTNESS OF BREATH    Dispense:  8.5 each    Refill:  5   busPIRone  (BUSPAR ) 15 MG tablet    Sig: Take 1 tablet (15 mg total) by mouth 2 (two) times daily.    Dispense:  180 tablet    Refill:  3   TRESIBA  FLEXTOUCH 100 UNIT/ML FlexTouch Pen    Sig: Inject 10 Units into the skin daily.     Follow up plan: Return in about 6 months (around 10/28/2024) for 6 month DM A1c.  Marsa Officer, DO [facility] 04/27/2024, 9:18 AM

## 2024-04-27 NOTE — Patient Instructions (Addendum)
 Thank you for coming to the office today.  Recent Labs    04/29/23 1022 10/20/23 1115 04/16/24 0824  HGBA1C 9.0* 8.4* 10.9*   Please ask Dr Cherilyn on a mychart message to ask about assistance with Diabetes management. Tell him that your A1c increased and I am asking for help going forward, we suggested adding insulin regimen.  Insulin Pen Needle  10 units Tresiba  insulin daily, and in future as you check blood sugars, we can adjust  CBG blood glucose >170+ consistently, means we should increase the dose.  Goal < 150-170 then we can pause the dose.  Sharyle pharmacist will call you with more instructions and information. You can update her on status.  Check [facility] Imaging Department for CT to talk to scheduling for the CT Coronary Calcium  Score  [phone] call to try to schedule  Please schedule a Follow-up Appointment to: Return in about 6 months (around 10/28/2024) for 6 month DM A1c.  If you have any other questions or concerns, please feel free to call the office or send a message through MyChart. You may also schedule an earlier appointment if necessary.  Additionally, you may be receiving a survey about your experience at our office within a few days to 1 week by e-mail or mail. We value your feedback.  Marsa Officer, DO [facility], [place]

## 2024-04-30 ENCOUNTER — Telehealth: Payer: Self-pay

## 2024-04-30 NOTE — Progress Notes (Signed)
 Care Guide Pharmacy Note  04/30/2024 Name: Aaron Schwartz MRN: [id number] DOB: 01-01-1981  Referred By: Edman Marsa PARAS, DO Reason for referral: Complex Care Management (Outreach to schedule with Pharm d )   Aaron Schwartz is a 43 y.o. year old male who is a primary care patient of Edman Marsa PARAS, DO.  Aaron Schwartz was referred to the pharmacist for assistance related to: DMII  Successful contact was made with the patient to discuss pharmacy services including being ready for the pharmacist to call at least 5 minutes before the scheduled appointment time and to have medication bottles and any blood pressure readings ready for review. The patient agreed to meet with the pharmacist via telephone visit on (date/time).05/14/2024  Aaron Schwartz , RMA     [place]  [facility], [facility] Guide  Direct Dial: [phone]  Website: [email or web address]

## 2024-05-04 ENCOUNTER — Encounter: Payer: Self-pay | Admitting: Nurse Practitioner

## 2024-05-04 ENCOUNTER — Telehealth: Payer: Self-pay | Admitting: Family Medicine

## 2024-05-04 NOTE — Telephone Encounter (Signed)
 Is there a new portal? Can this be verified for the patient?

## 2024-05-04 NOTE — Telephone Encounter (Signed)
 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 Please advise.     Patient Information  Patient Name Gender DOB SSN  Aaron Schwartz, Aaron Schwartz Male November 26, 1980 [id number]   Notes  Aaron Schwartz   05/04/2024  9:16 AM  Patient called.. said the DOT needs to go to the brand new federal portal?  DMV said it isn't there?  Pls call patient for clarity?  DMV says they cannot see it in the brand new portal?     Attachments   User Attached  Clinical call for Aaron Schwartz, Aaron Schwartz [[id number]] Aaron Schwartz, Aaron Schwartz 04/05/2024  9:11 AM  Reason: Copied from CRM   Resolution  Resolved On Reason By Resolved First Attempt?  04/05/2024 09:11 AM Clinical Call Created Aaron Schwartz, Aaron Schwartz No   Contacts  Contact Date/Time Type Contact Phone/Fax  04/05/2024 08:38 AM EDT Phone (Incoming) Aaron Schwartz (Self)   05/04/2024 09:11 AM EDT Phone (Incoming) Aaron Schwartz (Self)    Routing History   From To Priority  05/04/2024 09:16 AM Aaron Schwartz P CFP-ADMIN Routine  04/05/2024 08:46 AM Mabe, Aaron Schwartz SQUIBB CFP-CLINICAL Routine      Copied from CRM #[id number]. Topic: General - Other >> Apr 05, 2024  8:39 AM Aaron HERO wrote: Patient called to see if provider could send his DOT medical certificate to the [facility]. Please advise.

## 2024-05-05 LAB — HM DIABETES EYE EXAM

## 2024-05-14 ENCOUNTER — Telehealth: Payer: Self-pay | Admitting: Pharmacist

## 2024-05-14 ENCOUNTER — Other Ambulatory Visit

## 2024-05-14 NOTE — Progress Notes (Signed)
   Outreach Note  05/14/2024 Name: Aaron Schwartz MRN: [id number] DOB: 1981/05/13  Referred by: Edman Marsa PARAS, DO  Was unable to reach patient via telephone today and unable to leave a message as voicemail box is full   Follow Up Plan: Will collaborate with Care Guide to outreach to schedule follow up with me  Sharyle Sia, PharmD, JAQUELINE, CPP Clinical Pharmacist [facility] [phone]

## 2024-05-21 ENCOUNTER — Encounter: Payer: Self-pay | Admitting: Pharmacist

## 2024-05-21 ENCOUNTER — Other Ambulatory Visit

## 2024-05-21 DIAGNOSIS — E1169 Type 2 diabetes mellitus with other specified complication: Secondary | ICD-10-CM

## 2024-05-21 DIAGNOSIS — Z7984 Long term (current) use of oral hypoglycemic drugs: Secondary | ICD-10-CM

## 2024-05-21 MED ORDER — TRUE METRIX METER W/DEVICE KIT: PACK | 0 refills | Status: AC

## 2024-05-21 MED ORDER — LANCETS MISC. MISC: 3 refills | Status: AC

## 2024-05-21 MED ORDER — ACCU-CHEK GUIDE ME W/DEVICE KIT: PACK | 0 refills | Status: DC

## 2024-05-21 MED ORDER — TRUE METRIX BLOOD GLUCOSE TEST VI STRP: ORAL_STRIP | 3 refills | Status: AC

## 2024-05-21 MED ORDER — ACCU-CHEK SOFTCLIX LANCETS MISC: 3 refills | Status: AC

## 2024-05-21 MED ORDER — ACCU-CHEK GUIDE TEST VI STRP: ORAL_STRIP | 3 refills | Status: DC

## 2024-05-21 MED ORDER — BD PEN NEEDLE MICRO ULTRAFINE 32G X 6 MM MISC: 1 refills | Status: AC

## 2024-05-21 NOTE — Progress Notes (Signed)
 05/21/2024 Name: Aaron Schwartz MRN: [id number] DOB: [birth year]  Chief Complaint  Patient presents with   Medication Management    Aaron Schwartz is a 43 y.o. year old male who presented for a telephone visit.   They were referred to the pharmacist by their PCP for assistance in managing diabetes.   Conversation limited today as patient is heading into work.  Subjective:  Care Team: Primary Care Provider: Edman Marsa PARAS, DO ; Next Scheduled Visit: 11/01/2024 Endocrinologist Cherilyn Debby Quivers, MD; Next Scheduled Visit: 09/20/2024  Medication Access/Adherence  Current Pharmacy:  [facility] [phone] [place], [place] - [address] 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)  Denies currently monitoring home blood sugar; denies currently having a glucometer  Today reports has not yet started Tresiba  insulin as discussed with PCP, but is planning to start Tresiba  (10 units daily) tomorrow  Denies having pen needles to use with Tresiba    Objective:  Lab Results  Component Value Date   HGBA1C 10.9 (H) 04/16/2024    Lab Results  Component Value Date   CREATININE 1.44 (H) 04/16/2024   BUN 22 04/16/2024   NA 133 (L) 04/16/2024   K 4.9 04/16/2024   CL 95 (L) 04/16/2024   CO2 26 04/16/2024    Lab Results  Component Value Date   CHOL 203 (H) 04/16/2024   HDL 46 04/16/2024   LDLCALC 131 (H) 04/16/2024    TRIG 148 04/16/2024   CHOLHDL 4.4 04/16/2024    Current Outpatient Medications on File Prior to Visit  Medication Sig Dispense Refill   albuterol  (VENTOLIN  HFA) 108 (90 Base) MCG/ACT inhaler TAKE 2 PUFFS BY MOUTH EVERY 6 HOURS AS NEEDED FOR WHEEZE OR SHORTNESS OF BREATH 8.5 each 5   allopurinol  (ZYLOPRIM ) 100 MG tablet Take 2 tablets (200 mg total) by mouth daily. 180 tablet 3   atorvastatin  (LIPITOR) 10 MG tablet Take 1 tablet (10 mg total) by mouth at bedtime. 90 tablet 3   buPROPion  (WELLBUTRIN  XL) 300 MG 24 hr tablet Take 1 tablet (300 mg total) by mouth daily. 90 tablet 3   busPIRone  (BUSPAR ) 15 MG tablet Take 1 tablet (15 mg total) by mouth 2 (two) times daily. 180 tablet 3   clonazePAM  (KLONOPIN ) 0.5 MG tablet TAKE 1 TABLET BY MOUTH 2 TIMES DAILY AS NEEDED FOR ANXIETY. 30 tablet 2   FARXIGA  10 MG TABS tablet Take 1 tablet (10 mg total) by mouth daily before breakfast. 90 tablet 3   fluticasone  (FLONASE ) 50 MCG/ACT nasal spray SPRAY 2 SPRAYS INTO EACH NOSTRIL EVERY DAY 48 mL 0   ipratropium (ATROVENT ) 0.03 % nasal spray Place 2 sprays into both nostrils every 12 (twelve) hours. 30 mL 0   JANUVIA  100 MG tablet Take 1 tablet (100 mg total) by mouth daily. 90 tablet 3   levothyroxine (SYNTHROID) 137 MCG tablet Take 274 mcg by mouth daily before breakfast. Per Endocrinology     lisinopril  (ZESTRIL ) 20 MG tablet TAKE 1 TABLET BY MOUTH EVERY  DAY 90 tablet 3   NEEDLE, DISP, 18 G (BD DISP NEEDLES) 18G X 1-1/2 MISC 1 mg by Does not apply route every 14 (fourteen) days. 50 each 0   NEEDLE, DISP, 21 G (BD DISP NEEDLES) 21G X 1-1/2 MISC 1 mg by Does not apply route every 14 (fourteen) days. 50 each 0   ofloxacin  (OCUFLOX ) 0.3 % ophthalmic solution Place 1 drop into both eyes 4 (four) times daily. 5 mL 0   olopatadine  (PATANOL) 0.1 % ophthalmic solution Place 1 drop into both eyes 2 (two) times daily. For allergies, may use seasonal or long term. 5 mL 12   sildenafil  (REVATIO ) 20 MG tablet Take  1-5 pills about 30 min prior to sex. Start with 1 and increase as needed. 90 tablet 3   Syringe, Disposable, (2-3CC SYRINGE) 3 ML MISC 1 mg by Does not apply route every 14 (fourteen) days. 25 each 3   TRESIBA  FLEXTOUCH 100 UNIT/ML FlexTouch Pen Inject 10 Units into the skin daily.     triamcinolone  (KENALOG ) 0.025 % cream Apply topically daily as needed.     Vitamin D , Ergocalciferol , (DRISDOL) 1.25 MG (50000 UNIT) CAPS capsule Take 50,000 Units by mouth once a week.     No current facility-administered medications on file prior to visit.        Assessment/Plan:   Unable to complete medication review today. Conversation limited today as patient is heading into work. Will plan to complete during future appointment  Diabetes: - Currently uncontrolled - Reviewed long term cardiovascular and renal outcomes of uncontrolled blood sugar - Reviewed goal A1c, goal fasting, and goal 2 hour post prandial glucose - Reviewed dietary modifications including importance of having regular well-balanced meals and snacks throughout the day, while controlling carbohydrate portion sizes - Provide counseling on starting Tresiba  insulin/administration technique. Will also send patient manufacturer how to use video for Tresiba  to review prior to starting this medication - Counsel patient on s/s of low blood sugar and how to treat lows Review rules of 15s - importance of using 15 grams of sugar to treat low, recheck blood sugar in 15 minutes, treat again if remains low or, if back to normal, having meal if mealtime or snack  Review examples of sources of 15 grams of sugar Encourage patient to pick up and carry glucose tablets with him in case needed for low blood sugar - CPP sends prescriptions for insulin pen needles, as well as prescriptions for glucometer and testing supplies, to pharmacy for patient. Collaborate with [facility]. Determine True Metrix meter covered for lowest cost for  patient  Will send patient link on how to use True Metrix glucometer via MyChart - Recommend to check glucose, keep log of results and have this record to review at upcoming medical appointments. Patient to contact provider office sooner if needed for readings outside of established parameters or symptoms   Follow Up Plan: Clinical Pharmacist will follow up with patient by telephone on within the next 14 days. Patient requests to collaborate via MyChart to schedule this follow up appointment  Sharyle Sia, PharmD, JAQUELINE, CPP Clinical Pharmacist [facility] [phone]

## 2024-05-21 NOTE — Patient Instructions (Signed)
 The following is the web address for the how to take video/instructions for Tresiba  that we discussed:   [email or web address] .[email or web address]-to-take-[email or web address] /[email or web address]-[email or web address] .[email or web address]   Please copy and paste this web address into you internet browser to review prior to starting Tresiba . Please give me or the office a call with any questions.   I have sent prescriptions for the pen needles for you to use with the T[facility]  [facility]   [id number] [place], [place] - [address] [place] [address] Phone: [phone]   The following is the handout that we discussed on monitoring for signs of low blood sugar:    [email or web address]   I have also sent prescriptions for the True Metrix blood sugar meter and testing supplies to your pharmacy. The following is a how to use guide for this meter:   [email or web address]   Please also consider asking [facility] go [facility] this device with you when you pick up these prescriptions.       I was able to reschedule my previous appointment for next Wednesday (8/27) at 3:30 pm, so I have tentatively scheduled for us  to follow up by telephone again then. However, please let me know if this does not work and we can reschedule for a different day/time.     Thank you!   Sharyle Sia, PharmD, JAQUELINE, CPP Clinical Pharmacist [facility] [phone]

## 2024-05-26 ENCOUNTER — Other Ambulatory Visit

## 2024-05-26 ENCOUNTER — Telehealth: Payer: Self-pay | Admitting: Pharmacist

## 2024-05-26 NOTE — Progress Notes (Signed)
   Outreach Note  05/26/2024 Name: Aaron Schwartz MRN: [id number] DOB: 1981/08/31  Referred by: Edman Marsa PARAS, DO  Reach patient by telephone today, but he advises that now is not a good time  Follow Up Plan: As requested, will send patient a MyChart message with contact information for him to reach out to reschedule when able.  Sharyle Sia, PharmD, JAQUELINE, CPP Clinical Pharmacist [facility] [phone]

## 2024-07-03 ENCOUNTER — Other Ambulatory Visit: Payer: Self-pay | Admitting: Family Medicine

## 2024-07-03 DIAGNOSIS — J209 Acute bronchitis, unspecified: Secondary | ICD-10-CM

## 2024-07-03 DIAGNOSIS — J302 Other seasonal allergic rhinitis: Secondary | ICD-10-CM

## 2024-07-05 NOTE — Telephone Encounter (Signed)
 Requested Prescriptions  Pending Prescriptions Disp Refills   fluticasone  (FLONASE ) 50 MCG/ACT nasal spray [Pharmacy Med Name: FLUTICASONE  PROP 50 MCG SPRAY] 48 mL 0    Sig: SPRAY 2 SPRAYS INTO EACH NOSTRIL EVERY DAY     Ear, Nose, and Throat: Nasal Preparations - Corticosteroids Passed - 07/05/2024  2:23 PM      Passed - Valid encounter within last 12 months    Recent Outpatient Visits           2 months ago Annual physical exam   [place] [facility] [place], Marsa PARAS, DO   3 months ago Encounter for examination required by Department of Transportation (DOT)   [place] [facility] [place], [place] T, NP   5 months ago Acute conjunctivitis of both eyes, unspecified acute conjunctivitis type   [facility] Edman Marsa PARAS, DO   6 months ago Acute non-recurrent frontal sinusitis   [place] [facility] [place], Marsa PARAS, [place]

## 2024-10-21 ENCOUNTER — Other Ambulatory Visit: Payer: Self-pay | Admitting: Family Medicine

## 2024-10-21 DIAGNOSIS — J302 Other seasonal allergic rhinitis: Secondary | ICD-10-CM

## 2024-10-21 DIAGNOSIS — J209 Acute bronchitis, unspecified: Secondary | ICD-10-CM

## 2024-10-21 NOTE — Telephone Encounter (Signed)
 Requested by interface surescripts. Future visit 11/01/24. Requested Prescriptions  Pending Prescriptions Disp Refills   fluticasone  (FLONASE ) 50 MCG/ACT nasal spray [Pharmacy Med Name: FLUTICASONE  PROP 50 MCG SPRAY] 48 mL 0    Sig: SPRAY 2 SPRAYS INTO EACH NOSTRIL EVERY DAY     Ear, Nose, and Throat: Nasal Preparations - Corticosteroids Passed - 10/21/2024  2:30 PM      Passed - Valid encounter within last 12 months    Recent Outpatient Visits           5 months ago Annual physical exam   [place] [facility] [place], Marsa PARAS, DO   7 months ago Encounter for examination required by Department of Transportation (DOT)   [place] [facility] [place], [place] T, NP   8 months ago Acute conjunctivitis of both eyes, unspecified acute conjunctivitis type   [facility] Edman Marsa PARAS, DO   10 months ago Acute non-recurrent frontal sinusitis   [place] [facility] [place], Marsa PARAS, [place]

## 2024-11-01 ENCOUNTER — Ambulatory Visit: Admitting: Family Medicine

## 2024-12-28 ENCOUNTER — Ambulatory Visit: Admitting: Family Medicine
